# Patient Record
Sex: Female | Born: 1990 | Hispanic: No | Marital: Single | Smoking: Former smoker
Health system: Southern US, Community
[De-identification: ages and names within clinical notes are randomized; demographics above are authoritative.]

## PROBLEM LIST (undated history)

## (undated) DIAGNOSIS — E282 Polycystic ovarian syndrome: Secondary | ICD-10-CM

## (undated) DIAGNOSIS — T7840XA Allergy, unspecified, initial encounter: Secondary | ICD-10-CM

## (undated) DIAGNOSIS — R7303 Prediabetes: Secondary | ICD-10-CM

## (undated) DIAGNOSIS — Z87891 Personal history of nicotine dependence: Secondary | ICD-10-CM

## (undated) DIAGNOSIS — K219 Gastro-esophageal reflux disease without esophagitis: Secondary | ICD-10-CM

## (undated) DIAGNOSIS — D72829 Elevated white blood cell count, unspecified: Secondary | ICD-10-CM

## (undated) DIAGNOSIS — D649 Anemia, unspecified: Secondary | ICD-10-CM

## (undated) DIAGNOSIS — E669 Obesity, unspecified: Secondary | ICD-10-CM

## (undated) DIAGNOSIS — G8929 Other chronic pain: Secondary | ICD-10-CM

## (undated) HISTORY — DX: Personal history of nicotine dependence: Z87.891

## (undated) HISTORY — DX: Other chronic pain: G89.29

## (undated) HISTORY — PX: NO PAST SURGERIES: SHX2092

## (undated) HISTORY — PX: TONSILLECTOMY: SUR1361

## (undated) HISTORY — DX: Obesity, unspecified: E66.9

## (undated) HISTORY — DX: Allergy, unspecified, initial encounter: T78.40XA

---
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
## 2011-02-17 ENCOUNTER — Emergency Department (HOSPITAL_COMMUNITY)
Admission: EM | Admit: 2011-02-17 | Discharge: 2011-02-17 | Disposition: A | Discharge status: Home / Self Care | Payer: Self-pay | Attending: Emergency Medicine | Admitting: Emergency Medicine

## 2011-02-17 DIAGNOSIS — L02419 Cutaneous abscess of limb, unspecified: Secondary | ICD-10-CM | POA: Insufficient documentation

## 2011-02-17 DIAGNOSIS — Z880 Allergy status to penicillin: Secondary | ICD-10-CM | POA: Insufficient documentation

## 2011-03-25 ENCOUNTER — Emergency Department (HOSPITAL_COMMUNITY)
Admission: EM | Admit: 2011-03-25 | Discharge: 2011-03-25 | Discharge status: Against Medical Advice | Payer: Self-pay | Attending: Emergency Medicine | Admitting: Emergency Medicine

## 2011-03-25 DIAGNOSIS — R52 Pain, unspecified: Secondary | ICD-10-CM | POA: Insufficient documentation

## 2011-03-25 DIAGNOSIS — R070 Pain in throat: Secondary | ICD-10-CM | POA: Insufficient documentation

## 2011-03-26 ENCOUNTER — Emergency Department (HOSPITAL_COMMUNITY)
Admission: EM | Admit: 2011-03-26 | Discharge: 2011-03-26 | Disposition: A | Discharge status: Home / Self Care | Payer: Self-pay | Attending: Emergency Medicine | Admitting: Emergency Medicine

## 2011-03-26 DIAGNOSIS — J069 Acute upper respiratory infection, unspecified: Secondary | ICD-10-CM | POA: Insufficient documentation

## 2011-07-17 ENCOUNTER — Encounter (HOSPITAL_COMMUNITY): Payer: Self-pay | Admitting: *Deleted

## 2011-07-17 ENCOUNTER — Emergency Department (HOSPITAL_COMMUNITY)
Admission: EM | Admit: 2011-07-17 | Discharge: 2011-07-18 | Disposition: A | Discharge status: Home / Self Care | Payer: Self-pay | Attending: Emergency Medicine | Admitting: Emergency Medicine

## 2011-07-17 DIAGNOSIS — J45909 Unspecified asthma, uncomplicated: Secondary | ICD-10-CM | POA: Insufficient documentation

## 2011-07-17 DIAGNOSIS — R112 Nausea with vomiting, unspecified: Secondary | ICD-10-CM | POA: Insufficient documentation

## 2011-07-17 DIAGNOSIS — R51 Headache: Secondary | ICD-10-CM | POA: Insufficient documentation

## 2011-07-17 DIAGNOSIS — R509 Fever, unspecified: Secondary | ICD-10-CM | POA: Insufficient documentation

## 2011-07-17 DIAGNOSIS — R109 Unspecified abdominal pain: Secondary | ICD-10-CM | POA: Insufficient documentation

## 2011-07-17 DIAGNOSIS — R197 Diarrhea, unspecified: Secondary | ICD-10-CM | POA: Insufficient documentation

## 2011-07-17 LAB — CBC
MCH: 27.3 pg (ref 26.0–34.0)
MCHC: 33.9 g/dL (ref 30.0–36.0)
Platelets: 281 10*3/uL (ref 150–400)
RDW: 14.1 % (ref 11.5–15.5)

## 2011-07-17 LAB — URINALYSIS, ROUTINE W REFLEX MICROSCOPIC
Glucose, UA: NEGATIVE mg/dL
Leukocytes, UA: NEGATIVE
pH: 7.5 (ref 5.0–8.0)

## 2011-07-17 LAB — DIFFERENTIAL
Basophils Absolute: 0 10*3/uL (ref 0.0–0.1)
Basophils Relative: 0 % (ref 0–1)
Eosinophils Absolute: 0 10*3/uL (ref 0.0–0.7)
Neutro Abs: 5.7 10*3/uL (ref 1.7–7.7)
Neutrophils Relative %: 81 % — ABNORMAL HIGH (ref 43–77)

## 2011-07-17 LAB — COMPREHENSIVE METABOLIC PANEL
ALT: 22 U/L (ref 0–35)
AST: 17 U/L (ref 0–37)
Albumin: 3.5 g/dL (ref 3.5–5.2)
Alkaline Phosphatase: 80 U/L (ref 39–117)
BUN: 10 mg/dL (ref 6–23)
Potassium: 3.1 mEq/L — ABNORMAL LOW (ref 3.5–5.1)
Sodium: 131 mEq/L — ABNORMAL LOW (ref 135–145)
Total Protein: 7.4 g/dL (ref 6.0–8.3)

## 2011-07-17 LAB — URINE MICROSCOPIC-ADD ON

## 2011-07-17 LAB — LIPASE, BLOOD: Lipase: 9 U/L — ABNORMAL LOW (ref 11–59)

## 2011-07-17 LAB — POCT PREGNANCY, URINE: Preg Test, Ur: NEGATIVE

## 2011-07-17 MED ORDER — SODIUM CHLORIDE 0.9 % IV BOLUS (SEPSIS)
1000.0000 mL | Freq: Once | INTRAVENOUS | Status: AC
  Administered 2011-07-17: 1000 mL via INTRAVENOUS

## 2011-07-17 MED ORDER — ONDANSETRON HCL 4 MG/2ML IJ SOLN
4.0000 mg | Freq: Once | INTRAMUSCULAR | Status: AC
  Administered 2011-07-17: 4 mg via INTRAVENOUS
  Filled 2011-07-17: qty 2

## 2011-07-17 MED ORDER — HYDROMORPHONE HCL PF 1 MG/ML IJ SOLN
0.5000 mg | Freq: Once | INTRAMUSCULAR | Status: AC
  Administered 2011-07-17: 0.5 mg via INTRAVENOUS
  Filled 2011-07-17: qty 1

## 2011-07-17 MED ORDER — ACETAMINOPHEN 325 MG PO TABS
650.0000 mg | ORAL_TABLET | Freq: Four times a day (QID) | ORAL | Status: DC | PRN
  Administered 2011-07-17: 650 mg via ORAL
  Filled 2011-07-17: qty 2

## 2011-07-17 NOTE — ED Provider Notes (Signed)
History     CSN: [id number]  Arrival date & time 07/17/11  1736   First MD Initiated Contact with Patient 07/17/11 1924      Chief Complaint  Patient presents with  . Abdominal Pain  . Fever    HPI The patient presents with abdominal pain, fever, nausea, vomiting, diarrhea and headache.  She notes that her symptoms began yesterday, approximately 24 ago.  Since onset her symptoms have been worsening.  She denies attempts at control with any medications.  The pain is diffuse, crampy.  The patient denies any vaginal pain or discharge.  She does note mild vaginal spotting GERD the patient's last menstrual period was one month ago she denies any confusion, disorientation, dyspnea, chest pain.  Past Medical History  Diagnosis Date  . Asthma     Past Surgical History  Procedure Date  . Tonsillectomy     No family history on file.  History  Substance Use Topics  . Smoking status: Current Everyday Smoker  . Smokeless tobacco: Not on file  . Alcohol Use: Yes     once a week    OB History    Grav Para Term Preterm Abortions TAB SAB Ect Mult Living                  Review of Systems  Constitutional:       HPI  HENT:       HPI otherwise negative  Eyes: Negative.   Respiratory:       HPI, otherwise negative  Cardiovascular:       HPI, otherwise nmegative  Gastrointestinal: Positive for nausea, vomiting and diarrhea.  Genitourinary:       HPI, otherwise negative  Musculoskeletal:       HPI, otherwise negative  Skin: Negative for rash.  Neurological: Negative for syncope.    Allergies  Penicillins and Robitussin  Home Medications   Current Outpatient Rx  Name Route Sig Dispense Refill  . OXYCODONE HCL PO Oral Take 1 tablet by mouth once.      BP 121/100  Pulse 107  Temp(Src) 100.3 F (37.9 C) (Oral)  Resp 20  SpO2 100%  LMP 06/24/2011  Physical Exam  Nursing note and vitals reviewed. Constitutional: She is oriented to person, place, and time. She  appears well-developed and well-nourished. No distress.  HENT:  Head: Normocephalic and atraumatic.  Eyes: Conjunctivae and EOM are normal.  Cardiovascular: Normal rate and regular rhythm.   Pulmonary/Chest: Effort normal and breath sounds normal. No stridor. No respiratory distress.  Abdominal: She exhibits no distension.  Musculoskeletal: She exhibits no edema.  Neurological: She is alert and oriented to person, place, and time. No cranial nerve deficit.  Skin: Skin is warm and dry.  Psychiatric: She has a normal mood and affect.    ED Course  Procedures (including critical care time)  Labs Reviewed  URINALYSIS, ROUTINE W REFLEX MICROSCOPIC - Abnormal; Notable for the following:    Hgb urine dipstick LARGE (*)    All other components within normal limits  DIFFERENTIAL - Abnormal; Notable for the following:    Neutrophils Relative 81 (*)    Lymphocytes Relative 11 (*)    All other components within normal limits  COMPREHENSIVE METABOLIC PANEL - Abnormal; Notable for the following:    Sodium 131 (*)    Potassium 3.1 (*)    Glucose, Bld 104 (*)    All other components within normal limits  LIPASE, BLOOD - Abnormal; Notable for the  following:    Lipase 9 (*)    All other components within normal limits  POCT PREGNANCY, URINE  URINE MICROSCOPIC-ADD ON  CBC   No results found.   No diagnosis found.   Pulse oximetry 100% room air normal  MDM  This generally well 21 year old female presents with one day of headache, nausea, vomiting, diarrhea, fever.  On exam she is in no distress with a soft abdomen without guarding, focal tenderness.  The patient is not pregnant and her labs do not demonstrate leukocytosis nor acute findings.  She does have a fever, which improved with Tylenol.  Following fluids, Tylenol, analgesics, the patient noted substantial improvement in her condition.  Given this improvement, the absence of focal pain, pregnancy, persistent vomiting and diarrhea there  is low suspicion for ongoing acute pathology.  The patient's symptoms are likely viral in origin.  The patient is stable for discharge with continued evaluation by a primary care physician.  The patient requests referral to one of our affiliated clinics, this was accommodated.        Gerhard Munch, MD 07/17/11 2144

## 2011-07-17 NOTE — Discharge Instructions (Signed)
The definite cause of your headache, abdominal pain, vomiting was not identified through today's emergency department evaluation.  Your symptoms are most consistent with a viral gastroenteritis, or stomach "bug".  It is very important that you continue to have your condition evaluated and managed by a primary care physician.  Please use the provided referral information to ensure appropriate followup.  If you develop any new, or concerning changes in her condition, such as; pain focally in one area of your abdomen, confusion, persistent fever that this improved with Tylenol, persistent vomiting or diarrhea please return to the emergency department for a repeat evaluation.

## 2011-07-17 NOTE — ED Notes (Signed)
Pt reports severe lower abd pain that began last night. Sts pain radiates to both flanks. Denies UTI s/sx. Sts vaginal bleeding that began 3 days ago, spotting but with clots.

## 2011-07-17 NOTE — ED Notes (Addendum)
Fever at home as well. 101 today. Oxycodone taken at home, moms Rx, around 12.

## 2012-06-10 ENCOUNTER — Encounter (HOSPITAL_COMMUNITY): Payer: Self-pay | Admitting: Emergency Medicine

## 2012-06-10 ENCOUNTER — Emergency Department (HOSPITAL_COMMUNITY)
Admission: EM | Admit: 2012-06-10 | Discharge: 2012-06-10 | Disposition: A | Discharge status: Home / Self Care | Payer: Self-pay | Attending: Emergency Medicine | Admitting: Emergency Medicine

## 2012-06-10 DIAGNOSIS — Z3202 Encounter for pregnancy test, result negative: Secondary | ICD-10-CM | POA: Insufficient documentation

## 2012-06-10 DIAGNOSIS — R350 Frequency of micturition: Secondary | ICD-10-CM | POA: Insufficient documentation

## 2012-06-10 DIAGNOSIS — Z792 Long term (current) use of antibiotics: Secondary | ICD-10-CM | POA: Insufficient documentation

## 2012-06-10 DIAGNOSIS — F17200 Nicotine dependence, unspecified, uncomplicated: Secondary | ICD-10-CM | POA: Insufficient documentation

## 2012-06-10 DIAGNOSIS — Z79899 Other long term (current) drug therapy: Secondary | ICD-10-CM | POA: Insufficient documentation

## 2012-06-10 DIAGNOSIS — N898 Other specified noninflammatory disorders of vagina: Secondary | ICD-10-CM | POA: Insufficient documentation

## 2012-06-10 DIAGNOSIS — R110 Nausea: Secondary | ICD-10-CM | POA: Insufficient documentation

## 2012-06-10 DIAGNOSIS — A599 Trichomoniasis, unspecified: Secondary | ICD-10-CM | POA: Insufficient documentation

## 2012-06-10 DIAGNOSIS — J45909 Unspecified asthma, uncomplicated: Secondary | ICD-10-CM | POA: Insufficient documentation

## 2012-06-10 LAB — URINALYSIS, ROUTINE W REFLEX MICROSCOPIC
Bilirubin Urine: NEGATIVE
Glucose, UA: NEGATIVE mg/dL
Nitrite: NEGATIVE
Specific Gravity, Urine: 1.035 — ABNORMAL HIGH (ref 1.005–1.030)
pH: 6 (ref 5.0–8.0)

## 2012-06-10 LAB — WET PREP, GENITAL: Yeast Wet Prep HPF POC: NONE SEEN

## 2012-06-10 LAB — URINE MICROSCOPIC-ADD ON

## 2012-06-10 MED ORDER — METRONIDAZOLE 500 MG PO TABS: 500.0000 mg | ORAL_TABLET | Freq: Two times a day (BID) | ORAL | Status: DC

## 2012-06-10 MED ORDER — METRONIDAZOLE 500 MG PO TABS
2000.0000 mg | ORAL_TABLET | Freq: Once | ORAL | Status: AC
  Administered 2012-06-10: 2000 mg via ORAL
  Filled 2012-06-10: qty 4

## 2012-06-10 NOTE — ED Provider Notes (Signed)
Medical screening examination/treatment/procedure(s) were performed by non-physician practitioner and as supervising physician I was immediately available for consultation/collaboration.   Tinsley Lomas H Tinzlee Craker, MD 06/10/12 2354 

## 2012-06-10 NOTE — ED Notes (Signed)
Pt reports painful urination with burning x3 days. Urinary frequency and urgency. Pt denies vaginal bleeding, but "milky" vaginal discharge today. Denies abd pain. +nausea.

## 2012-06-10 NOTE — ED Provider Notes (Signed)
History     CSN: [id number]  Arrival date & time 06/10/12  1803   First MD Initiated Contact with Patient 06/10/12 1822      Chief Complaint  Patient presents with  . Dysuria    (Consider location/radiation/quality/duration/timing/severity/associated sxs/prior treatment) HPI Comments: This is a 22 year old female, who presents emergency department with chief complaint of dysuria. Patient states that her symptoms began 3 days ago. Additionally, she endorses urinary frequency and urgency. She has also noticed some vaginal discharge, but she is unsure of how long it has been present. She denies any abdominal pain, but says that she has been mildly nauseated. She has not tried anything to alleviate her symptoms. Nothing makes her symptoms better or worse.  The history is provided by the patient. No language interpreter was used.    Past Medical History  Diagnosis Date  . Asthma     Past Surgical History  Procedure Date  . Tonsillectomy     No family history on file.  History  Substance Use Topics  . Smoking status: Current Every Day Smoker -- 1.0 packs/day    Types: Cigarettes  . Smokeless tobacco: Not on file  . Alcohol Use: No     Comment: rarely    OB History    Grav Para Term Preterm Abortions TAB SAB Ect Mult Living                  Review of Systems  All other systems reviewed and are negative.    Allergies  Penicillins and Robitussin  Home Medications   Current Outpatient Rx  Name  Route  Sig  Dispense  Refill  . IBUPROFEN 200 MG PO TABS   Oral   Take 400 mg by mouth every 8 (eight) hours as needed. For pain.         Marland Kitchen AMOXICILLIN 500 MG PO CAPS   Oral   Take 500 mg by mouth 3 (three) times daily.           BP 125/61  Pulse 74  Temp 98.6 F (37 C) (Oral)  Resp 20  SpO2 100%  Physical Exam  Nursing note and vitals reviewed. Constitutional: She is oriented to person, place, and time. She appears well-developed and well-nourished.    HENT:  Head: Normocephalic and atraumatic.  Eyes: Conjunctivae normal and EOM are normal. Pupils are equal, round, and reactive to light.  Neck: Normal range of motion. Neck supple.  Cardiovascular: Normal rate and regular rhythm.  Exam reveals no gallop and no friction rub.   No murmur heard. Pulmonary/Chest: Effort normal and breath sounds normal. No respiratory distress. She has no wheezes. She has no rales. She exhibits no tenderness.  Abdominal: Soft. Bowel sounds are normal. She exhibits no distension and no mass. There is no tenderness. There is no rebound and no guarding.  Genitourinary: No labial fusion. There is no rash, tenderness, lesion or injury on the right labia. There is no rash, tenderness, lesion or injury on the left labia. Uterus is not deviated, not enlarged, not fixed and not tender. Cervix exhibits no motion tenderness, no discharge and no friability. Right adnexum displays no mass, no tenderness and no fullness. Left adnexum displays no mass, no tenderness and no fullness. No erythema, tenderness or bleeding around the vagina. No foreign body around the vagina. No signs of injury around the vagina. Vaginal discharge found.       White frothy discharge noted from vagina  Musculoskeletal: Normal range of  motion. She exhibits no edema and no tenderness.  Neurological: She is alert and oriented to person, place, and time.  Skin: Skin is warm and dry.  Psychiatric: She has a normal mood and affect. Her behavior is normal. Judgment and thought content normal.    ED Course  Procedures (including critical care time)   Labs Reviewed  POCT PREGNANCY, URINE  URINALYSIS, ROUTINE W REFLEX MICROSCOPIC   Results for orders placed during the hospital encounter of 06/10/12  URINALYSIS, ROUTINE W REFLEX MICROSCOPIC      Component Value Range   Color, Urine YELLOW  YELLOW   APPearance CLOUDY (*) CLEAR   Specific Gravity, Urine 1.035 (*) 1.005 - 1.030   pH 6.0  5.0 - 8.0    Glucose, UA NEGATIVE  NEGATIVE mg/dL   Hgb urine dipstick NEGATIVE  NEGATIVE   Bilirubin Urine NEGATIVE  NEGATIVE   Ketones, ur NEGATIVE  NEGATIVE mg/dL   Protein, ur 30 (*) NEGATIVE mg/dL   Urobilinogen, UA 1.0  0.0 - 1.0 mg/dL   Nitrite NEGATIVE  NEGATIVE   Leukocytes, UA MODERATE (*) NEGATIVE  POCT PREGNANCY, URINE      Component Value Range   Preg Test, Ur NEGATIVE  NEGATIVE  WET PREP, GENITAL      Component Value Range   Yeast Wet Prep HPF POC NONE SEEN  NONE SEEN   Trich, Wet Prep MODERATE (*) NONE SEEN   Clue Cells Wet Prep HPF POC NONE SEEN  NONE SEEN   WBC, Wet Prep HPF POC FEW (*) NONE SEEN  URINE MICROSCOPIC-ADD ON      Component Value Range   Squamous Epithelial / LPF RARE  RARE   WBC, UA 11-20  <3 WBC/hpf   Bacteria, UA RARE  RARE   Urine-Other TRICHOMONAS PRESENT       1. Trichimoniasis       MDM  22 year old female with dysuria and vaginal discharge. Will order UA, and perform pelvic exam.  8:13 PM Labs showed trichomoniasis, will treat the patient with metronidazole, and counseled the patient regarding transmission.  Patient understands and agrees with the plan. She is stable and ready for discharge.  8:22 PM  Patient states she can't afford the medicine, will treat with 2g metronidazole now.       Roxy Horseman, PA-C 06/10/12 2013  Roxy Horseman, PA-C 06/10/12 2022

## 2012-06-10 NOTE — ED Notes (Signed)
Pt presenting to ed with c/o dysuria pt states urinary frequency and urgency. Pt denies burning with urination at this time

## 2012-06-12 LAB — GC/CHLAMYDIA PROBE AMP: CT Probe RNA: POSITIVE — AB

## 2012-06-13 NOTE — ED Notes (Signed)
+  Chlamydia. Chart sent to EDP office for review. [facility] attached. 

## 2012-06-17 NOTE — ED Notes (Signed)
Chart returned from EDP office  rx for Zithromax 2 gram po needs to be called to pharmacy.

## 2012-06-19 NOTE — ED Notes (Signed)
Patient notified, prescription called to [facility] by [facility].

## 2012-07-28 ENCOUNTER — Encounter (HOSPITAL_COMMUNITY): Payer: Self-pay | Admitting: *Deleted

## 2012-07-28 ENCOUNTER — Emergency Department (HOSPITAL_COMMUNITY)
Admission: EM | Admit: 2012-07-28 | Discharge: 2012-07-28 | Disposition: A | Discharge status: Home / Self Care | Payer: Self-pay | Attending: Emergency Medicine | Admitting: Emergency Medicine

## 2012-07-28 DIAGNOSIS — L509 Urticaria, unspecified: Secondary | ICD-10-CM | POA: Insufficient documentation

## 2012-07-28 DIAGNOSIS — L272 Dermatitis due to ingested food: Secondary | ICD-10-CM | POA: Insufficient documentation

## 2012-07-28 DIAGNOSIS — H5789 Other specified disorders of eye and adnexa: Secondary | ICD-10-CM | POA: Insufficient documentation

## 2012-07-28 DIAGNOSIS — F17200 Nicotine dependence, unspecified, uncomplicated: Secondary | ICD-10-CM | POA: Insufficient documentation

## 2012-07-28 DIAGNOSIS — R21 Rash and other nonspecific skin eruption: Secondary | ICD-10-CM | POA: Insufficient documentation

## 2012-07-28 DIAGNOSIS — L259 Unspecified contact dermatitis, unspecified cause: Secondary | ICD-10-CM | POA: Insufficient documentation

## 2012-07-28 DIAGNOSIS — J45909 Unspecified asthma, uncomplicated: Secondary | ICD-10-CM | POA: Insufficient documentation

## 2012-07-28 MED ORDER — DEXAMETHASONE SODIUM PHOSPHATE 10 MG/ML IJ SOLN
10.0000 mg | Freq: Once | INTRAMUSCULAR | Status: AC
  Administered 2012-07-28: 10 mg via INTRAMUSCULAR
  Filled 2012-07-28: qty 1

## 2012-07-28 MED ORDER — TRIAMCINOLONE ACETONIDE 0.1 % EX CREA: TOPICAL_CREAM | Freq: Three times a day (TID) | CUTANEOUS | Status: DC

## 2012-07-28 MED ORDER — DIPHENHYDRAMINE HCL 25 MG PO CAPS
50.0000 mg | ORAL_CAPSULE | Freq: Once | ORAL | Status: AC
  Administered 2012-07-28: 50 mg via ORAL
  Filled 2012-07-28: qty 2

## 2012-07-28 NOTE — ED Provider Notes (Signed)
History     CSN: [id number]  Arrival date & time 07/28/12  1445   First MD Initiated Contact with Patient 07/28/12 [phone]      Chief Complaint  Patient presents with  . Allergic Reaction    (Consider location/radiation/quality/duration/timing/severity/associated sxs/prior treatment) HPI Comments: 22 year old female presents to the ED complaining of swollen left eye and pruritus after cooking shrimp after ingesting two of them. She noticed the eye swelling and a "funny feeling" in her throat shortly after ingestion. In route to the ED pt noticed hives erupting on upper back, inner arms, and legs. Swelling around eye has decreased, pruritus has worsened. Pt feels throat is fine at present and breathing is comfortable. Eye had clear watery discharge after swelling began. Pt denies SOB, oral edema, vision changes, chest pain. No prior reaction. Denies taking any medication for sxs. PCN allergy.   Patient is a 22 y.o. female presenting with allergic reaction. The history is provided by the patient.  Allergic Reaction The primary symptoms are  rash. The primary symptoms do not include wheezing, shortness of breath, cough or nausea.  Significant symptoms that are not present include eye redness.    Past Medical History  Diagnosis Date  . Asthma     Past Surgical History  Procedure Laterality Date  . Tonsillectomy      No family history on file.  History  Substance Use Topics  . Smoking status: Current Every Day Smoker -- 1.00 packs/day    Types: Cigarettes  . Smokeless tobacco: Not on file  . Alcohol Use: No     Comment: rarely    OB History   Grav Para Term Preterm Abortions TAB SAB Ect Mult Living                  Review of Systems  Constitutional: Negative for diaphoresis.  HENT: Positive for facial swelling (L eyelid). Negative for sore throat, drooling, trouble swallowing, neck pain, neck stiffness and voice change.   Eyes: Negative for photophobia, pain, discharge,  redness, itching and visual disturbance.  Respiratory: Negative for apnea, cough, choking, chest tightness, shortness of breath, wheezing and stridor.   Cardiovascular: Negative for chest pain.  Gastrointestinal: Negative for nausea.  Skin: Positive for rash. Negative for color change, pallor and wound.  Allergic/Immunologic: Positive for food allergies.  All other systems reviewed and are negative.    Allergies  Penicillins and Robitussin  Home Medications   Current Outpatient Rx  Name  Route  Sig  Dispense  Refill  . Aspirin-Salicylamide-Caffeine (BC HEADACHE POWDER PO)   Oral   Take 1 packet by mouth daily as needed. For headache           BP 123/78  Pulse 85  Temp(Src) 98.2 F (36.8 C) (Oral)  Resp 20  SpO2 99%  LMP 07/25/2012  Physical Exam  Nursing note and vitals reviewed. Constitutional: She is oriented to person, place, and time. She appears well-developed and well-nourished.  HENT:  Head: Normocephalic and atraumatic.  Mouth/Throat: Uvula is midline, oropharynx is clear and moist and mucous membranes are normal. No oral lesions. No edematous. No posterior oropharyngeal edema or posterior oropharyngeal erythema.  Eyes: Conjunctivae and EOM are normal. Pupils are equal, round, and reactive to light. Left eye exhibits no discharge.  Mild left upper eyelid swelling. Non tender. No discharge.   Neck: Normal range of motion. Neck supple. No tracheal deviation present.  Cardiovascular: Normal rate, regular rhythm and normal heart sounds.  Pulmonary/Chest: Effort normal and breath sounds normal. No respiratory distress. She has no wheezes. She has no rhonchi. She has no rales.  Musculoskeletal: Normal range of motion. She exhibits no edema.  Lymphadenopathy:    She has no cervical adenopathy.  Neurological: She is alert and oriented to person, place, and time.  Skin: Skin is warm and dry. Rash (few urticarial lesions on upper back, neck, inner arms, and legs  bilaterally  ) noted. No erythema. No pallor.  Psychiatric: She has a normal mood and affect. Her behavior is normal.    ED Course  Procedures (including critical care time)  Labs Reviewed - No data to display No results found.   1. Dermatitis due to allergic reaction to food   2. Hives       MDM  Possible allergic reaction to shrimp. Advised her to stay away from shrimp. Shot of IM Decadron given in the emergency department. I prescribed her triamcinolone cream. Advised her to take Benadryl for itching. Return precautions discussed. No respiratory compromise. She is in no apparent distress. No visual disturbance. Patient states understanding of plan and is agreeable.        Trevor Mace, PA-C 07/28/12 1606

## 2012-07-28 NOTE — ED Notes (Signed)
PT states she recently ate shrimp and had immediate reaction with left eye swelling.  Pt is itching all over with hives.  Airway intact

## 2012-07-29 NOTE — ED Provider Notes (Signed)
Medical screening examination/treatment/procedure(s) were performed by non-physician practitioner and as supervising physician I was immediately available for consultation/collaboration.   Lyanne Co, MD 07/29/12 [phone]

## 2012-08-11 ENCOUNTER — Emergency Department (HOSPITAL_COMMUNITY): Payer: No Typology Code available for payment source

## 2012-08-11 ENCOUNTER — Encounter (HOSPITAL_COMMUNITY): Payer: Self-pay | Admitting: *Deleted

## 2012-08-11 ENCOUNTER — Emergency Department (HOSPITAL_COMMUNITY)
Admission: EM | Admit: 2012-08-11 | Discharge: 2012-08-11 | Disposition: A | Discharge status: Home / Self Care | Payer: No Typology Code available for payment source | Attending: Emergency Medicine | Admitting: Emergency Medicine

## 2012-08-11 DIAGNOSIS — J45909 Unspecified asthma, uncomplicated: Secondary | ICD-10-CM | POA: Insufficient documentation

## 2012-08-11 DIAGNOSIS — Y92410 Unspecified street and highway as the place of occurrence of the external cause: Secondary | ICD-10-CM | POA: Insufficient documentation

## 2012-08-11 DIAGNOSIS — IMO0002 Reserved for concepts with insufficient information to code with codable children: Secondary | ICD-10-CM | POA: Insufficient documentation

## 2012-08-11 DIAGNOSIS — F17200 Nicotine dependence, unspecified, uncomplicated: Secondary | ICD-10-CM | POA: Insufficient documentation

## 2012-08-11 DIAGNOSIS — Y9389 Activity, other specified: Secondary | ICD-10-CM | POA: Insufficient documentation

## 2012-08-11 DIAGNOSIS — S39012A Strain of muscle, fascia and tendon of lower back, initial encounter: Secondary | ICD-10-CM

## 2012-08-11 DIAGNOSIS — Z8742 Personal history of other diseases of the female genital tract: Secondary | ICD-10-CM | POA: Insufficient documentation

## 2012-08-11 DIAGNOSIS — E669 Obesity, unspecified: Secondary | ICD-10-CM | POA: Insufficient documentation

## 2012-08-11 HISTORY — DX: Polycystic ovarian syndrome: E28.2

## 2012-08-11 MED ORDER — IBUPROFEN 800 MG PO TABS
800.0000 mg | ORAL_TABLET | Freq: Once | ORAL | Status: AC
  Administered 2012-08-11: 800 mg via ORAL
  Filled 2012-08-11: qty 1

## 2012-08-11 MED ORDER — HYDROCODONE-ACETAMINOPHEN 5-325 MG PO TABS
1.0000 | ORAL_TABLET | Freq: Once | ORAL | Status: AC
  Administered 2012-08-11: 1 via ORAL
  Filled 2012-08-11: qty 1

## 2012-08-11 MED ORDER — IBUPROFEN 800 MG PO TABS: 800.0000 mg | ORAL_TABLET | Freq: Three times a day (TID) | ORAL | Status: DC | PRN
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
## 2012-08-11 NOTE — ED Notes (Signed)
Patient transported to X-ray 

## 2012-08-12 NOTE — ED Provider Notes (Signed)
Medical screening examination/treatment/procedure(s) were performed by non-physician practitioner and as supervising physician I was immediately available for consultation/collaboration.  John-Adam Lovada Barwick, M.D.     John-Adam Margherita Collyer, MD 08/12/12 0346 

## 2012-08-22 ENCOUNTER — Encounter (HOSPITAL_COMMUNITY): Payer: Self-pay | Admitting: Emergency Medicine

## 2012-08-22 ENCOUNTER — Emergency Department (HOSPITAL_COMMUNITY)
Admission: EM | Admit: 2012-08-22 | Discharge: 2012-08-22 | Disposition: A | Discharge status: Home / Self Care | Payer: Self-pay | Attending: Emergency Medicine | Admitting: Emergency Medicine

## 2012-08-22 DIAGNOSIS — J029 Acute pharyngitis, unspecified: Secondary | ICD-10-CM | POA: Insufficient documentation

## 2012-08-22 DIAGNOSIS — J3489 Other specified disorders of nose and nasal sinuses: Secondary | ICD-10-CM | POA: Insufficient documentation

## 2012-08-22 DIAGNOSIS — Z8742 Personal history of other diseases of the female genital tract: Secondary | ICD-10-CM | POA: Insufficient documentation

## 2012-08-22 DIAGNOSIS — J069 Acute upper respiratory infection, unspecified: Secondary | ICD-10-CM | POA: Insufficient documentation

## 2012-08-22 DIAGNOSIS — F17200 Nicotine dependence, unspecified, uncomplicated: Secondary | ICD-10-CM | POA: Insufficient documentation

## 2012-08-22 DIAGNOSIS — R05 Cough: Secondary | ICD-10-CM | POA: Insufficient documentation

## 2012-08-22 DIAGNOSIS — J45909 Unspecified asthma, uncomplicated: Secondary | ICD-10-CM | POA: Insufficient documentation

## 2012-08-22 DIAGNOSIS — R059 Cough, unspecified: Secondary | ICD-10-CM | POA: Insufficient documentation

## 2012-08-22 DIAGNOSIS — R0982 Postnasal drip: Secondary | ICD-10-CM | POA: Insufficient documentation

## 2012-08-22 MED ORDER — DIPHENHYDRAMINE HCL 12.5 MG/5ML PO SYRP: 25.0000 mg | ORAL_SOLUTION | Freq: Four times a day (QID) | ORAL | Status: DC | PRN

## 2012-08-22 MED ORDER — CETIRIZINE-PSEUDOEPHEDRINE ER 5-120 MG PO TB12: 1.0000 | ORAL_TABLET | Freq: Two times a day (BID) | ORAL | Status: DC

## 2012-08-22 NOTE — ED Provider Notes (Signed)
History     CSN: [id number]  Arrival date & time 08/22/12  0409   First MD Initiated Contact with Patient 08/22/12 [phone]      Chief Complaint  Patient presents with  . Sore Throat  . Cough   HPI  History provided by the patient. Patient is a 22 year old female has history of asthma who presents with complaints of worsening sore throat, congestion and post nasal drip. Symptoms first began about 2-3 days ago but became much worse this morning. Patient has used some Alka-Seltzer cold medicine without any improvement of symptoms. Denies any known sick contacts. Has not had any recent travel. She denies any fever but reports some subjective chills and sweats. She has occasional nonproductive cough. Denies any shortness of breath or chest pain. No nausea vomiting or diarrhea symptoms. No other aggravating or alleviating factors. No other associated symptoms.    Past Medical History  Diagnosis Date  . Asthma   . Polycystic ovarian disease     Past Surgical History  Procedure Laterality Date  . Tonsillectomy      No family history on file.  History  Substance Use Topics  . Smoking status: Current Every Day Smoker -- 1.00 packs/day    Types: Cigarettes  . Smokeless tobacco: Not on file  . Alcohol Use: Yes     Comment: rarely    OB History   Grav Para Term Preterm Abortions TAB SAB Ect Mult Living                  Review of Systems  Constitutional: Positive for chills and diaphoresis. Negative for fever and appetite change.  HENT: Positive for congestion, sore throat and postnasal drip. Negative for ear pain.   Respiratory: Positive for cough. Negative for shortness of breath.   Gastrointestinal: Negative for vomiting, abdominal pain and diarrhea.  All other systems reviewed and are negative.    Allergies  Penicillins; Robitussin; and Shrimp  Home Medications   Current Outpatient Rx  Name  Route  Sig  Dispense  Refill  . ibuprofen (ADVIL,MOTRIN) 800 MG tablet  Oral   Take 1 tablet (800 mg total) by mouth every 8 (eight) hours as needed for pain.   30 tablet   0   . Aspirin-Salicylamide-Caffeine (BC HEADACHE POWDER PO)   Oral   Take 1 packet by mouth daily as needed (Headache). For headache         . cetirizine-pseudoephedrine (ZYRTEC-D) 5-120 MG per tablet   Oral   Take 1 tablet by mouth 2 (two) times daily.   30 tablet   0   . diphenhydrAMINE (BENYLIN) 12.5 MG/5ML syrup   Oral   Take 10 mLs (25 mg total) by mouth 4 (four) times daily as needed (congestion and sore throat).   120 mL   0     BP 127/68  Pulse 105  Temp(Src) 98.9 F (37.2 C) (Oral)  Resp 18  Wt 276 lb 4 oz (125.306 kg)  BMI 45.97 kg/m2  SpO2 99%  LMP 08/02/2012  Physical Exam  Nursing note and vitals reviewed. Constitutional: She is oriented to person, place, and time. She appears well-developed and well-nourished. No distress.  HENT:  Head: Normocephalic.  Right Ear: Tympanic membrane normal.  Left Ear: Tympanic membrane normal.  Mild erythema of pharynx with cobblestoning. No exudate. Uvula midline.  Neck: Normal range of motion. Neck supple.  Cardiovascular: Normal rate and regular rhythm.   No murmur heard. Pulmonary/Chest: Effort normal and breath sounds  normal. No respiratory distress. She has no wheezes. She has no rales.  Abdominal: Soft. There is no tenderness.  Musculoskeletal: Normal range of motion.  Lymphadenopathy:    She has no cervical adenopathy.  Neurological: She is alert and oriented to person, place, and time.  Skin: Skin is warm and dry. No rash noted.  Psychiatric: She has a normal mood and affect. Her behavior is normal.    ED Course  Procedures      1. URI (upper respiratory infection)   2. Pharyngitis       MDM  4:30 AM patient seen and evaluated. Patient well-appearing in no acute distress. She is not appears fairly ill or toxic.        Angus Seller, PA-C 08/22/12 [phone]

## 2012-08-22 NOTE — ED Notes (Signed)
Pt c/o non productive cough and sore throat x 3 days.

## 2012-08-22 NOTE — ED Provider Notes (Signed)
Medical screening examination/treatment/procedure(s) were performed by non-physician practitioner and as supervising physician I was immediately available for consultation/collaboration.  Jones Skene, M.D.   Jones Skene, MD 08/22/12 [phone]

## 2012-09-19 ENCOUNTER — Emergency Department (HOSPITAL_COMMUNITY)
Admission: EM | Admit: 2012-09-19 | Discharge: 2012-09-19 | Disposition: A | Discharge status: Home / Self Care | Payer: Self-pay | Attending: Emergency Medicine | Admitting: Emergency Medicine

## 2012-09-19 ENCOUNTER — Encounter (HOSPITAL_COMMUNITY): Payer: Self-pay | Admitting: *Deleted

## 2012-09-19 DIAGNOSIS — F17200 Nicotine dependence, unspecified, uncomplicated: Secondary | ICD-10-CM | POA: Insufficient documentation

## 2012-09-19 DIAGNOSIS — Z8742 Personal history of other diseases of the female genital tract: Secondary | ICD-10-CM | POA: Insufficient documentation

## 2012-09-19 DIAGNOSIS — R1084 Generalized abdominal pain: Secondary | ICD-10-CM | POA: Insufficient documentation

## 2012-09-19 DIAGNOSIS — J45909 Unspecified asthma, uncomplicated: Secondary | ICD-10-CM | POA: Insufficient documentation

## 2012-09-19 DIAGNOSIS — R1110 Vomiting, unspecified: Secondary | ICD-10-CM | POA: Insufficient documentation

## 2012-09-19 LAB — COMPREHENSIVE METABOLIC PANEL
ALT: 19 U/L (ref 0–35)
AST: 16 U/L (ref 0–37)
CO2: 27 mEq/L (ref 19–32)
Calcium: 9.7 mg/dL (ref 8.4–10.5)
Chloride: 104 mEq/L (ref 96–112)
GFR calc non Af Amer: >90 mL/min (ref >90)
Potassium: 3.3 mEq/L — ABNORMAL LOW (ref 3.5–5.1)
Sodium: 139 mEq/L (ref 135–145)

## 2012-09-19 LAB — URINALYSIS, ROUTINE W REFLEX MICROSCOPIC
Leukocytes, UA: NEGATIVE
Nitrite: NEGATIVE
Specific Gravity, Urine: 1.027 (ref 1.005–1.030)
Urobilinogen, UA: 0.2 mg/dL (ref 0.0–1.0)

## 2012-09-19 LAB — CBC
MCH: 27.1 pg (ref 26.0–34.0)
Platelets: 370 10*3/uL (ref 150–400)
RBC: 4.61 MIL/uL (ref 3.87–5.11)
WBC: 9.1 10*3/uL (ref 4.0–10.5)

## 2012-09-19 MED ORDER — ONDANSETRON 4 MG PO TBDP
4.0000 mg | ORAL_TABLET | Freq: Once | ORAL | Status: AC
  Administered 2012-09-19: 4 mg via ORAL
  Filled 2012-09-19: qty 1

## 2012-09-19 MED ORDER — ONDANSETRON 4 MG PO TBDP: 4.0000 mg | ORAL_TABLET | Freq: Once | ORAL | Status: DC

## 2012-09-19 MED ORDER — HYDROCODONE-ACETAMINOPHEN 5-325 MG PO TABS: 1.0000 | ORAL_TABLET | Freq: Four times a day (QID) | ORAL | Status: DC | PRN

## 2012-09-19 NOTE — ED Provider Notes (Signed)
History     CSN: [id number]  Arrival date & time 09/19/12  1701   First MD Initiated Contact with Patient 09/19/12 1705      Chief Complaint  Patient presents with  . Emesis    (Consider location/radiation/quality/duration/timing/severity/associated sxs/prior treatment) Patient is a 22 y.o. female presenting with vomiting. The history is provided by the patient.  Emesis Severity:  Mild Duration:  1 day Timing:  Sporadic Number of daily episodes:  2 Quality:  Stomach contents Able to tolerate:  Liquids and solids Progression:  Unchanged Chronicity:  New Recent urination:  Normal Context: not post-tussive and not self-induced   Relieved by:  None tried Worsened by:  Nothing tried Ineffective treatments:  None tried Associated symptoms: abdominal pain (diffuse abdominal cramping )   Associated symptoms: no arthralgias, no chills, no cough, no diarrhea, no fever, no headaches, no myalgias, no sore throat and no URI   Risk factors: no alcohol use, no diabetes, not pregnant now, no prior abdominal surgery, no sick contacts, no suspect food intake and no travel to endemic areas     Past Medical History  Diagnosis Date  . Asthma   . Polycystic ovarian disease     Past Surgical History  Procedure Laterality Date  . Tonsillectomy      No family history on file.  History  Substance Use Topics  . Smoking status: Current Every Day Smoker -- 1.00 packs/day    Types: Cigarettes  . Smokeless tobacco: Not on file  . Alcohol Use: Yes     Comment: rarely    OB History   Grav Para Term Preterm Abortions TAB SAB Ect Mult Living                  Review of Systems  Constitutional: Negative for chills.  HENT: Negative for sore throat.   Gastrointestinal: Positive for vomiting and abdominal pain (diffuse abdominal cramping ). Negative for diarrhea.  Musculoskeletal: Negative for myalgias and arthralgias.  Neurological: Negative for headaches.  All other systems reviewed  and are negative.    Allergies  Penicillins; Robitussin; and Shrimp  Home Medications   Current Outpatient Rx  Name  Route  Sig  Dispense  Refill  . Aspirin-Salicylamide-Caffeine (BC HEADACHE POWDER PO)   Oral   Take 1 packet by mouth daily as needed (Headache). For headache         . cetirizine-pseudoephedrine (ZYRTEC-D) 5-120 MG per tablet   Oral   Take 1 tablet by mouth 2 (two) times daily.   30 tablet   0   . diphenhydrAMINE (BENYLIN) 12.5 MG/5ML syrup   Oral   Take 10 mLs (25 mg total) by mouth 4 (four) times daily as needed (congestion and sore throat).   120 mL   0   . ibuprofen (ADVIL,MOTRIN) 800 MG tablet   Oral   Take 1 tablet (800 mg total) by mouth every 8 (eight) hours as needed for pain.   30 tablet   0     BP 124/74  Pulse 74  Temp(Src) 98.9 F (37.2 C) (Oral)  Resp 20  SpO2 100%  LMP 09/12/2012  Physical Exam  Nursing note and vitals reviewed. Constitutional: Vital signs are normal. She appears well-developed and well-nourished. No distress.  HENT:  Head: Normocephalic and atraumatic.  Mouth/Throat: Uvula is midline, oropharynx is clear and moist and mucous membranes are normal.  Eyes: Conjunctivae and EOM are normal. Pupils are equal, round, and reactive to light.  Neck: Normal range  of motion and full passive range of motion without pain. Neck supple. No spinous process tenderness and no muscular tenderness present. No rigidity. No Brudzinski's sign noted.  Cardiovascular: Normal rate and regular rhythm.   Pulmonary/Chest: Effort normal and breath sounds normal. No accessory muscle usage. Not tachypneic. No respiratory distress.  Abdominal: Soft. Normal appearance. She exhibits no distension, no ascites, no pulsatile midline mass and no mass. There is tenderness. There is no CVA tenderness. No hernia.    Obese  Lymphadenopathy:    She has no cervical adenopathy.  Neurological: She is alert.  Skin: Skin is warm and dry. No rash noted. She  is not diaphoretic.  Psychiatric: She has a normal mood and affect. Her speech is normal and behavior is normal.    ED Course  Procedures (including critical care time)  Labs Reviewed  URINALYSIS, ROUTINE W REFLEX MICROSCOPIC - Abnormal; Notable for the following:    APPearance CLOUDY (*)    All other components within normal limits  COMPREHENSIVE METABOLIC PANEL - Abnormal; Notable for the following:    Potassium 3.3 (*)    Albumin 3.3 (*)    Total Bilirubin 0.1 (*)    All other components within normal limits  CBC  LIPASE, BLOOD  PREGNANCY, URINE   No results found.   No diagnosis found.  Pt states that she has no abdominal pain and does not want the [place] if her labs are normal, which they are. Abdomen re-examined and very low suspicion for surgical abdomen as tenderness has improved & there are no peritoneal signs. Emergent [place] not indicated.   MDM  22 year old obese female with history of polycystic ovarian disease presents emergency department with epigastric and right upper quadrant abdominal pain, nausea and emesis.  Labs reviewed without acute abnormalities. Gallbladder etiology not likely based on normal labs, but strict return precautions discussed.  Patient tolerating by mouth fluids in the emergency department.  We'll discharge with pain medication and primary care followup.  Resource guide given.        Jaci Carrel, [place] 09/19/12 1942

## 2012-09-19 NOTE — ED Provider Notes (Signed)
Medical screening examination/treatment/procedure(s) were performed by non-physician practitioner and as supervising physician I was immediately available for consultation/collaboration.    Celene Kras, MD 09/19/12 2006

## 2012-09-19 NOTE — ED Notes (Signed)
Pt c/o onset of nausea last night, reports vomiting twice today.c/o generalized abd pain "all over". Denies diarrhea, denies fever

## 2012-12-04 ENCOUNTER — Emergency Department (HOSPITAL_COMMUNITY)
Admission: EM | Admit: 2012-12-04 | Discharge: 2012-12-04 | Disposition: A | Discharge status: Home / Self Care | Payer: Self-pay | Attending: Emergency Medicine | Admitting: Emergency Medicine

## 2012-12-04 ENCOUNTER — Encounter (HOSPITAL_COMMUNITY): Payer: Self-pay | Admitting: Emergency Medicine

## 2012-12-04 ENCOUNTER — Emergency Department (HOSPITAL_COMMUNITY): Payer: Self-pay

## 2012-12-04 DIAGNOSIS — Z3202 Encounter for pregnancy test, result negative: Secondary | ICD-10-CM | POA: Insufficient documentation

## 2012-12-04 DIAGNOSIS — R109 Unspecified abdominal pain: Secondary | ICD-10-CM | POA: Insufficient documentation

## 2012-12-04 DIAGNOSIS — M549 Dorsalgia, unspecified: Secondary | ICD-10-CM | POA: Insufficient documentation

## 2012-12-04 DIAGNOSIS — R197 Diarrhea, unspecified: Secondary | ICD-10-CM | POA: Insufficient documentation

## 2012-12-04 DIAGNOSIS — F17200 Nicotine dependence, unspecified, uncomplicated: Secondary | ICD-10-CM | POA: Insufficient documentation

## 2012-12-04 DIAGNOSIS — G8929 Other chronic pain: Secondary | ICD-10-CM | POA: Insufficient documentation

## 2012-12-04 DIAGNOSIS — Z79899 Other long term (current) drug therapy: Secondary | ICD-10-CM | POA: Insufficient documentation

## 2012-12-04 DIAGNOSIS — Z880 Allergy status to penicillin: Secondary | ICD-10-CM | POA: Insufficient documentation

## 2012-12-04 DIAGNOSIS — J45909 Unspecified asthma, uncomplicated: Secondary | ICD-10-CM | POA: Insufficient documentation

## 2012-12-04 DIAGNOSIS — E282 Polycystic ovarian syndrome: Secondary | ICD-10-CM | POA: Insufficient documentation

## 2012-12-04 DIAGNOSIS — R110 Nausea: Secondary | ICD-10-CM | POA: Insufficient documentation

## 2012-12-04 DIAGNOSIS — R51 Headache: Secondary | ICD-10-CM | POA: Insufficient documentation

## 2012-12-04 LAB — COMPREHENSIVE METABOLIC PANEL
BUN: 11 mg/dL (ref 6–23)
CO2: 29 mEq/L (ref 19–32)
Calcium: 9.1 mg/dL (ref 8.4–10.5)
Creatinine, Ser: 0.77 mg/dL (ref 0.50–1.10)
GFR calc Af Amer: >90 mL/min (ref >90)
GFR calc non Af Amer: >90 mL/min (ref >90)
Glucose, Bld: 86 mg/dL (ref 70–99)

## 2012-12-04 LAB — CBC WITH DIFFERENTIAL/PLATELET
Basophils Absolute: 0 10*3/uL (ref 0.0–0.1)
Eosinophils Relative: 3 % (ref 0–5)
HCT: 39 % (ref 36.0–46.0)
Lymphocytes Relative: 31 % (ref 12–46)
Lymphs Abs: 2.4 10*3/uL (ref 0.7–4.0)
MCV: 83 fL (ref 78.0–100.0)
Monocytes Absolute: 0.7 10*3/uL (ref 0.1–1.0)
RDW: 14.9 % (ref 11.5–15.5)
WBC: 7.8 10*3/uL (ref 4.0–10.5)

## 2012-12-04 LAB — LIPASE, BLOOD: Lipase: 15 U/L (ref 11–59)

## 2012-12-04 LAB — URINALYSIS, ROUTINE W REFLEX MICROSCOPIC
Leukocytes, UA: NEGATIVE
Protein, ur: NEGATIVE mg/dL
Urobilinogen, UA: 0.2 mg/dL (ref 0.0–1.0)

## 2012-12-04 LAB — POCT PREGNANCY, URINE: Preg Test, Ur: NEGATIVE

## 2012-12-04 LAB — CG4 I-STAT (LACTIC ACID): Lactic Acid, Venous: 0.56 mmol/L (ref 0.5–2.2)

## 2012-12-04 MED ORDER — HYDROCODONE-ACETAMINOPHEN 5-325 MG PO TABS: 1.0000 | ORAL_TABLET | Freq: Three times a day (TID) | ORAL | Status: DC | PRN

## 2012-12-04 MED ORDER — ONDANSETRON HCL 4 MG PO TABS: 4.0000 mg | ORAL_TABLET | Freq: Four times a day (QID) | ORAL | Status: DC

## 2012-12-04 MED ORDER — ONDANSETRON 4 MG PO TBDP
4.0000 mg | ORAL_TABLET | Freq: Once | ORAL | Status: AC
  Administered 2012-12-04: 4 mg via ORAL
  Filled 2012-12-04: qty 1

## 2012-12-04 MED ORDER — GI COCKTAIL ~~LOC~~
30.0000 mL | Freq: Once | ORAL | Status: AC
  Administered 2012-12-04: 30 mL via ORAL
  Filled 2012-12-04: qty 30

## 2012-12-04 MED ORDER — FAMOTIDINE 20 MG PO TABS: 20.0000 mg | ORAL_TABLET | Freq: Two times a day (BID) | ORAL | Status: DC

## 2012-12-04 NOTE — ED Notes (Signed)
Pt c/o sharp abdominal pain on L side of abdomen. Pt states pain radiates from L side to mid abdomen. Pt is intermittent and sharp per pt. Pt states she has had the pain off and on for months and has been evaluated for same at ED. Pt states she was supposed to have an ultra sound the last time she was in the ED, but decided she didn't want to stay any longer. Pt states she has slight nausea and had diarrhea earlier today. Pt ambulatory to exam room with steady gait. Pt states she drove herself here.

## 2012-12-04 NOTE — ED Provider Notes (Signed)
Medical screening examination/treatment/procedure(s) were performed by non-physician practitioner and as supervising physician I was immediately available for consultation/collaboration.   Amariah Kierstead H Gaia Gullikson, MD 12/04/12 2316 

## 2012-12-04 NOTE — ED Provider Notes (Signed)
History    CSN: [id number] Arrival date & time 12/04/12  1615  First MD Initiated Contact with Patient 12/04/12 1650     Chief Complaint  Patient presents with  . Abdominal Pain   (Consider location/radiation/quality/duration/timing/severity/associated sxs/prior Treatment) The history is provided by the patient. No language interpreter was used.  Tasha Hughes is a 22 y/o F with PMHx of asthma, PCOS presenting to the ED with abdominal pain that has been ongoing for the past 7 months, stated that the abdominal pain is localized to the LUQ with radiation to the epigastric region described as an intermittent sharp, shooting pain that is episodic lasting approximately 1 hour at a time. Patient reported that the pain is worse in the mornings, calms down within to 1 hour, drinking a lot of water helps. Stated that when she eats she is fine, but approximately 20 minutes after she eats she has discomfort. Stated that at times she does feel a burning sensation to the epigastric region when she eats. Stated that she has these episodes of discomfort approximately 3-4 times per day. Stated that has been having intermittent nausea, reported 4 episodes of diarrhea today. Stated that she has not been taking anything for the abdominal pain. Denied fever, chills, pelvic pain, flank pain, vomiting, melena, hematochezia, abdominal surgery, chest pain, shortness of breath, difficulty breathing, vaginal pain, vaginal discharge, vaginal bleeding, chronic NSAID use. PCP none  Patient reported that she was here is 08/2012 for the same complaint - reported that she was supposed to get UA, but did not because of the fact that she had to leave.  Reported still having appendix and gallbladder.   Past Medical History  Diagnosis Date  . Asthma   . Polycystic ovarian disease    Past Surgical History  Procedure Laterality Date  . Tonsillectomy     History reviewed. No pertinent family history. History    Substance Use Topics  . Smoking status: Current Every Day Smoker -- 1.00 packs/day    Types: Cigarettes  . Smokeless tobacco: Not on file  . Alcohol Use: Yes     Comment: rarely   OB History   Grav Para Term Preterm Abortions TAB SAB Ect Mult Living                 Review of Systems  Constitutional: Negative for fever and chills.  HENT: Negative for neck pain.   Eyes: Negative for pain and visual disturbance.  Respiratory: Negative for cough, chest tightness and shortness of breath.   Cardiovascular: Negative for chest pain.  Gastrointestinal: Positive for nausea, abdominal pain and diarrhea. Negative for vomiting, constipation, blood in stool, anal bleeding and rectal pain.  Genitourinary: Negative for dysuria, flank pain, vaginal bleeding, vaginal discharge, difficulty urinating, vaginal pain and pelvic pain.  Musculoskeletal: Positive for back pain (baseline for patient).  Neurological: Positive for headaches. Negative for dizziness, weakness, light-headedness and numbness.  All other systems reviewed and are negative.    Allergies  Penicillins; Robitussin; and Shrimp  Home Medications   Current Outpatient Rx  Name  Route  Sig  Dispense  Refill  . Aspirin-Salicylamide-Caffeine (BC HEADACHE POWDER PO)   Oral   Take 1 packet by mouth daily as needed (Headache). For headache         . albuterol (PROVENTIL HFA;VENTOLIN HFA) 108 (90 BASE) MCG/ACT inhaler   Inhalation   Inhale 2 puffs into the lungs every 6 (six) hours as needed for wheezing or shortness of breath.         Marland Kitchen  famotidine (PEPCID) 20 MG tablet   Oral   Take 1 tablet (20 mg total) by mouth 2 (two) times daily.   30 tablet   0   . HYDROcodone-acetaminophen (NORCO) 5-325 MG per tablet   Oral   Take 1 tablet by mouth every 8 (eight) hours as needed for pain.   7 tablet   0   . ondansetron (ZOFRAN) 4 MG tablet   Oral   Take 1 tablet (4 mg total) by mouth every 6 (six) hours.   12 tablet   0     BP 114/76  Pulse 76  Temp(Src) 98.9 F (37.2 C) (Oral)  Resp 16  SpO2 100%  LMP 10/04/2012 Physical Exam  Nursing note and vitals reviewed. Constitutional: She is oriented to person, place, and time. She appears well-developed. No distress.  HENT:  Head: Normocephalic and atraumatic.  Eyes: Conjunctivae and EOM are normal. Pupils are equal, round, and reactive to light. Right eye exhibits no discharge. Left eye exhibits no discharge.  Neck: Normal range of motion. Neck supple.  Cardiovascular: Normal rate, regular rhythm and normal heart sounds.  Exam reveals no friction rub.   No murmur heard. Pulmonary/Chest: Effort normal and breath sounds normal. No respiratory distress. She has no wheezes. She has no rales.  Abdominal: Soft. Bowel sounds are normal. She exhibits no distension. There is no hepatosplenomegaly. There is tenderness in the epigastric area and left upper quadrant. There is no rigidity, no rebound, no guarding, no tenderness at McBurney's point and negative Murphy's sign.    Negative Murphy's sign Negative Obturator Negative Psoas Obese   Lymphadenopathy:    She has no cervical adenopathy.  Neurological: She is alert and oriented to person, place, and time. No cranial nerve deficit. She exhibits normal muscle tone. Coordination normal.  Skin: Skin is warm and dry. No rash noted. She is not diaphoretic. No erythema.  Psychiatric: She has a normal mood and affect. Her behavior is normal. Thought content normal.    ED Course  Procedures (including critical care time)   Medications  gi cocktail (Maalox,Lidocaine,Donnatal) (30 mLs Oral Given 12/04/12 1809)  ondansetron (ZOFRAN-ODT) disintegrating tablet 4 mg (4 mg Oral Given 12/04/12 1808)    Labs Reviewed  COMPREHENSIVE METABOLIC PANEL - Abnormal; Notable for the following:    Albumin 3.2 (*)    Total Bilirubin 0.1 (*)    All other components within normal limits  CBC WITH DIFFERENTIAL  LIPASE, BLOOD   URINALYSIS, ROUTINE W REFLEX MICROSCOPIC  POCT PREGNANCY, URINE  CG4 I-STAT (LACTIC ACID)   US Abdomen Complete  12/04/2012   *RADIOLOGY REPORT*  Clinical Data:  Left upper quadrant abdominal pain, nausea and diarrhea.  COMPLETE ABDOMINAL ULTRASOUND  Comparison:  None.  Findings:  Gallbladder:  No gallstones, gallbladder wall thickening, or pericholecystic fluid.  Common bile duct:  Normal, measuring 3.8 mm in diameter proximally.  Liver:  No focal lesion identified.  Within normal limits in parenchymal echogenicity.  IVC:  Appears normal.  Pancreas:  No focal abnormality seen.  Spleen:  Normal, measuring 7.5 cm in length.  Right Kidney:  Normal, measuring 12.3 cm in length.  Left Kidney:  Normal, measuring 12.3 cm in length as well.  Abdominal aorta:  Obscured distally.  No visible aneurysm.  IMPRESSION: No acute abnormality.   Original Report Authenticated By: Beckie Salts, M.D.   1. Chronic abdominal pain   2. PCOS (polycystic ovarian syndrome)   3. Asthma, unspecified asthma severity, uncomplicated  MDM  Patient presenting to the ED with abdominal pain localized to the LLQ and epigastric region that has been ongoing for the past 7 months, reported that the discomfort started in January 2014. Reported discomfort to be of a burning sensation after eating, with intermittent sharp shooting pains that last about an hour. Stated that the pain is worse in the mornings and 20 minutes after eating meals. Reported that she eats fast food at least 2 times per week and eats a lot of spicy foods because mother is [place] and [place].   Negative acute abdomen, negative peritoneal signs. Negative psoas, obturator. Lactic acid negative elevation. UA negative findings - negative infections. CBC negative findings. CMP negative findings. Lipase negative elevation. UA abdomen negative findings noted - negative inflammatory processes noted. Doubt pancreatitis. Pain controlled in ED setting. Suspicion to be GERD,  possible gastritis. Patient stable, afebrile. Discharged patient. [facility]erred [facility] to [facility]. Discharged patient with pepcid, anti-emetics, pain medications - discussed course, disposal techniques. Discussed with patient to refrain from eating foods high in fat and grease, did not recommend fast food, discussed with patient to decrease spicy food intake as well as NSAID use if any. Discussed with patient to rest and stay hydrated. Discussed with patient to continue to monitor symptoms and if symptoms are to worsen or change to report back to the ED - strict return instructions given. Patient agreed to plan of care, understood, all questions answered.   Raymon Mutton, PA-C 12/04/12 2100

## 2012-12-04 NOTE — ED Notes (Signed)
Pt is dressed and ready to go,  Will notify PA

## 2012-12-07 LAB — POCT PREGNANCY, URINE: Preg Test, Ur: NEGATIVE

## 2013-03-01 ENCOUNTER — Emergency Department (HOSPITAL_COMMUNITY): Payer: No Typology Code available for payment source

## 2013-03-01 ENCOUNTER — Emergency Department (HOSPITAL_COMMUNITY)
Admission: EM | Admit: 2013-03-01 | Discharge: 2013-03-01 | Disposition: A | Discharge status: Home / Self Care | Payer: Self-pay | Attending: Emergency Medicine | Admitting: Emergency Medicine

## 2013-03-01 ENCOUNTER — Encounter (HOSPITAL_COMMUNITY): Payer: Self-pay

## 2013-03-01 DIAGNOSIS — Z8639 Personal history of other endocrine, nutritional and metabolic disease: Secondary | ICD-10-CM | POA: Insufficient documentation

## 2013-03-01 DIAGNOSIS — R300 Dysuria: Secondary | ICD-10-CM | POA: Insufficient documentation

## 2013-03-01 DIAGNOSIS — Z862 Personal history of diseases of the blood and blood-forming organs and certain disorders involving the immune mechanism: Secondary | ICD-10-CM | POA: Insufficient documentation

## 2013-03-01 DIAGNOSIS — R1084 Generalized abdominal pain: Secondary | ICD-10-CM | POA: Insufficient documentation

## 2013-03-01 DIAGNOSIS — Z79899 Other long term (current) drug therapy: Secondary | ICD-10-CM | POA: Insufficient documentation

## 2013-03-01 DIAGNOSIS — J45909 Unspecified asthma, uncomplicated: Secondary | ICD-10-CM | POA: Insufficient documentation

## 2013-03-01 DIAGNOSIS — Z3202 Encounter for pregnancy test, result negative: Secondary | ICD-10-CM | POA: Insufficient documentation

## 2013-03-01 DIAGNOSIS — F17200 Nicotine dependence, unspecified, uncomplicated: Secondary | ICD-10-CM | POA: Insufficient documentation

## 2013-03-01 DIAGNOSIS — R109 Unspecified abdominal pain: Secondary | ICD-10-CM

## 2013-03-01 DIAGNOSIS — Z880 Allergy status to penicillin: Secondary | ICD-10-CM | POA: Insufficient documentation

## 2013-03-01 DIAGNOSIS — R112 Nausea with vomiting, unspecified: Secondary | ICD-10-CM | POA: Insufficient documentation

## 2013-03-01 DIAGNOSIS — K219 Gastro-esophageal reflux disease without esophagitis: Secondary | ICD-10-CM | POA: Insufficient documentation

## 2013-03-01 HISTORY — DX: Gastro-esophageal reflux disease without esophagitis: K21.9

## 2013-03-01 LAB — CBC WITH DIFFERENTIAL/PLATELET
Eosinophils Absolute: 0.1 10*3/uL (ref 0.0–0.7)
Eosinophils Relative: 1 % (ref 0–5)
Hemoglobin: 13.2 g/dL (ref 12.0–15.0)
Lymphs Abs: 1.5 10*3/uL (ref 0.7–4.0)
MCH: 26.9 pg (ref 26.0–34.0)
MCHC: 32.5 g/dL (ref 30.0–36.0)
MCV: 82.7 fL (ref 78.0–100.0)
Monocytes Absolute: 1 10*3/uL (ref 0.1–1.0)
Monocytes Relative: 7 % (ref 3–12)
RBC: 4.91 MIL/uL (ref 3.87–5.11)

## 2013-03-01 LAB — LIPASE, BLOOD: Lipase: 11 U/L (ref 11–59)

## 2013-03-01 LAB — WET PREP, GENITAL: Yeast Wet Prep HPF POC: NONE SEEN

## 2013-03-01 LAB — URINALYSIS, ROUTINE W REFLEX MICROSCOPIC
Hgb urine dipstick: NEGATIVE
Nitrite: NEGATIVE
Specific Gravity, Urine: 1.023 (ref 1.005–1.030)
Urobilinogen, UA: 0.2 mg/dL (ref 0.0–1.0)
pH: 6 (ref 5.0–8.0)

## 2013-03-01 LAB — POCT PREGNANCY, URINE: Preg Test, Ur: NEGATIVE

## 2013-03-01 LAB — COMPREHENSIVE METABOLIC PANEL
Alkaline Phosphatase: 78 U/L (ref 39–117)
BUN: 10 mg/dL (ref 6–23)
Calcium: 9.3 mg/dL (ref 8.4–10.5)
Creatinine, Ser: 0.52 mg/dL (ref 0.50–1.10)
GFR calc Af Amer: >90 mL/min (ref >90)
Glucose, Bld: 84 mg/dL (ref 70–99)
Total Protein: 7.5 g/dL (ref 6.0–8.3)

## 2013-03-01 MED ORDER — ONDANSETRON 4 MG PO TBDP: 4.0000 mg | ORAL_TABLET | Freq: Three times a day (TID) | ORAL | Status: DC | PRN

## 2013-03-01 MED ORDER — ONDANSETRON HCL 4 MG/2ML IJ SOLN
4.0000 mg | Freq: Once | INTRAMUSCULAR | Status: AC
  Administered 2013-03-01: 4 mg via INTRAVENOUS
  Filled 2013-03-01: qty 2

## 2013-03-01 MED ORDER — IOHEXOL 300 MG/ML  SOLN
100.0000 mL | Freq: Once | INTRAMUSCULAR | Status: AC | PRN
  Administered 2013-03-01: 100 mL via INTRAVENOUS

## 2013-03-01 MED ORDER — IBUPROFEN 800 MG PO TABS
800.0000 mg | ORAL_TABLET | Freq: Once | ORAL | Status: AC
  Administered 2013-03-01: 800 mg via ORAL
  Filled 2013-03-01: qty 1

## 2013-03-01 MED ORDER — IOHEXOL 300 MG/ML  SOLN
50.0000 mL | Freq: Once | INTRAMUSCULAR | Status: AC | PRN
  Administered 2013-03-01: 50 mL via ORAL

## 2013-03-01 MED ORDER — MORPHINE SULFATE 4 MG/ML IJ SOLN
4.0000 mg | Freq: Once | INTRAMUSCULAR | Status: AC
  Administered 2013-03-01: 4 mg via INTRAVENOUS
  Filled 2013-03-01: qty 1

## 2013-03-01 NOTE — ED Provider Notes (Signed)
Pt received from PA Szelkalski at shift change.  Pt presenting to the ED for lower abdominal pain, onset this morning, associated with some nausea and vomiting.  Normal BM.  No chest pain, SOB, vaginal complaints.  Labs and pelvic completed-- mild leukocytosis at 13.7, wet prep with few clue cells.  Plan-- CT scan pending.  If negative, d/c home.  Results for orders placed during the hospital encounter of 03/01/13  WET PREP, GENITAL      Result Value Range   Yeast Wet Prep HPF POC NONE SEEN  NONE SEEN   Trich, Wet Prep NONE SEEN  NONE SEEN   Clue Cells Wet Prep HPF POC FEW (*) NONE SEEN   WBC, Wet Prep HPF POC RARE (*) NONE SEEN  URINALYSIS, ROUTINE W REFLEX MICROSCOPIC      Result Value Range   Color, Urine YELLOW  YELLOW   APPearance CLEAR  CLEAR   Specific Gravity, Urine 1.023  1.005 - 1.030   pH 6.0  5.0 - 8.0   Glucose, UA NEGATIVE  NEGATIVE mg/dL   Hgb urine dipstick NEGATIVE  NEGATIVE   Bilirubin Urine NEGATIVE  NEGATIVE   Ketones, ur NEGATIVE  NEGATIVE mg/dL   Protein, ur NEGATIVE  NEGATIVE mg/dL   Urobilinogen, UA 0.2  0.0 - 1.0 mg/dL   Nitrite NEGATIVE  NEGATIVE   Leukocytes, UA NEGATIVE  NEGATIVE  CBC WITH DIFFERENTIAL      Result Value Range   WBC 13.7 (*) 4.0 - 10.5 K/uL   RBC 4.91  3.87 - 5.11 MIL/uL   Hemoglobin 13.2  12.0 - 15.0 g/dL   HCT 40.9  81.1 - 91.4 %   MCV 82.7  78.0 - 100.0 fL   MCH 26.9  26.0 - 34.0 pg   MCHC 32.5  30.0 - 36.0 g/dL   RDW 78.2  95.6 - 21.3 %   Platelets 368  150 - 400 K/uL   Neutrophils Relative % 81 (*) 43 - 77 %   Neutro Abs 11.1 (*) 1.7 - 7.7 K/uL   Lymphocytes Relative 11 (*) 12 - 46 %   Lymphs Abs 1.5  0.7 - 4.0 K/uL   Monocytes Relative 7  3 - 12 %   Monocytes Absolute 1.0  0.1 - 1.0 K/uL   Eosinophils Relative 1  0 - 5 %   Eosinophils Absolute 0.1  0.0 - 0.7 K/uL   Basophils Relative 0  0 - 1 %   Basophils Absolute 0.0  0.0 - 0.1 K/uL  COMPREHENSIVE METABOLIC PANEL      Result Value Range   Sodium 136  135 - 145 mEq/L    Potassium 3.7  3.5 - 5.1 mEq/L   Chloride 102  96 - 112 mEq/L   CO2 25  19 - 32 mEq/L   Glucose, Bld 84  70 - 99 mg/dL   BUN 10  6 - 23 mg/dL   Creatinine, Ser 0.86  0.50 - 1.10 mg/dL   Calcium 9.3  8.4 - 57.8 mg/dL   Total Protein 7.5  6.0 - 8.3 g/dL   Albumin 3.4 (*) 3.5 - 5.2 g/dL   AST 14  0 - 37 U/L   ALT 20  0 - 35 U/L   Alkaline Phosphatase 78  39 - 117 U/L   Total Bilirubin 0.2 (*) 0.3 - 1.2 mg/dL   GFR calc non Af Amer >90  >90 mL/min   GFR calc Af Amer >90  >90 mL/min  LIPASE, BLOOD  Result Value Range   Lipase 11  11 - 59 U/L  POCT PREGNANCY, URINE      Result Value Range   Preg Test, Ur NEGATIVE  NEGATIVE   Ct Abdomen Pelvis W Contrast  03/01/2013   CLINICAL DATA:  Bilateral lower abdominal pain.  EXAM: CT ABDOMEN AND PELVIS WITH CONTRAST  TECHNIQUE: Multidetector CT imaging of the abdomen and pelvis was performed using the standard protocol following bolus administration of intravenous contrast.  CONTRAST:  50mL OMNIPAQUE IOHEXOL 300 MG/ML SOLN, OMNIPAQUE IOHEXOL 300 MG/ML SOLN  COMPARISON:  None  FINDINGS: Limited visualization of the lower thorax demonstrates minimal dependent atelectasis within the left lower lobe. Normal heart size.  The liver, spleen, pancreas and bilateral adrenal glands are unremarkable. Kidneys enhance symmetrically with contrast. No hydronephrosis.  Normal caliber abdominal aorta. No retroperitoneal lymphadenopathy. Urinary bladder is unremarkable. Uterus and bilateral adnexa are grossly unremarkable.  No abnormal bowel wall thickening. No free fluid or free intraperitoneal air. No evidence for bowel obstruction.  No acute appearing osseous lesions.  IMPRESSION: No CT cause for acute abdominal pain identified.   Electronically Signed   By: Annia Belt M.D.   On: 03/01/2013 16:14    4:35 PM CT as above-- no significant findings.  Pt re-evaluated, lying comfortably in bed, NAD.  Informed of CT results, she acknowledged understanding.  Will  initiate PO challenge.  Pt tolerated PO without difficulty.  Pt afebrile, non-toxic appearing, NAD, VS stable- ok for discharge.  Rx zofran.  FU with [facility] if problems occur.  Discussed plan with pt, she agreed.  Return precautions advised.  Garlon Hatchet, PA-C 03/01/13 2232

## 2013-03-01 NOTE — ED Notes (Signed)
No Emesis taking po fludis

## 2013-03-01 NOTE — ED Provider Notes (Signed)
CSN: [id number]     Arrival date & time 03/01/13  4098 History   First MD Initiated Contact with Patient 03/01/13 1121     Chief Complaint  Patient presents with  . Abdominal Pain   (Consider location/radiation/quality/duration/timing/severity/associated sxs/prior Treatment) HPI Comments: Patient is a 22 year old female with a past medical history of PCOS, GERD and asthma who presents with abdominal pain that started this morning. The pain is located in her lower abdomen and does not radiate. The pain is described as aching and severe. The pain started gradually and progressively worsened since the onset. No alleviating/aggravating factors. The patient has tried nothing for symptoms without relief. Associated symptoms include nausea and vomiting. Patient denies fever, headache, diarrhea, chest pain, SOB, dysuria, constipation, abnormal vaginal bleeding/discharge. Patient reports last normal bowel movement was this morning.      Past Medical History  Diagnosis Date  . Asthma   . Polycystic ovarian disease   . GERD (gastroesophageal reflux disease)    Past Surgical History  Procedure Laterality Date  . Tonsillectomy     Family History  Problem Relation Age of Onset  . Hypertension Mother    History  Substance Use Topics  . Smoking status: Current Every Day Smoker -- 1.00 packs/day    Types: Cigarettes  . Smokeless tobacco: Never Used  . Alcohol Use: Yes     Comment: rarely   OB History   Grav Para Term Preterm Abortions TAB SAB Ect Mult Living                 Review of Systems  Gastrointestinal: Positive for nausea, vomiting and abdominal pain.  Genitourinary: Positive for dysuria.  All other systems reviewed and are negative.    Allergies  Penicillins; Robitussin; and Shrimp  Home Medications   Current Outpatient Rx  Name  Route  Sig  Dispense  Refill  . Aspirin-Salicylamide-Caffeine (BC HEADACHE POWDER PO)   Oral   Take 1 packet by mouth daily as needed  (Headache). For headache         . famotidine (PEPCID) 20 MG tablet   Oral   Take 20 mg by mouth 2 (two) times daily as needed for heartburn.         Marland Kitchen albuterol (PROVENTIL HFA;VENTOLIN HFA) 108 (90 BASE) MCG/ACT inhaler   Inhalation   Inhale 2 puffs into the lungs every 6 (six) hours as needed for wheezing or shortness of breath.          BP 105/56  Pulse 101  Temp(Src) 98.2 F (36.8 C) (Oral)  Resp 18  Ht 5\' 5"  (1.651 m)  Wt 265 lb (120.203 kg)  BMI 44.1 kg/m2  SpO2 98%  LMP 02/24/2013 Physical Exam  Nursing note and vitals reviewed. Constitutional: She is oriented to person, place, and time. She appears well-developed and well-nourished. No distress.  HENT:  Head: Normocephalic and atraumatic.  Eyes: Conjunctivae and EOM are normal. No scleral icterus.  Neck: Normal range of motion.  Cardiovascular: Normal rate and regular rhythm.  Exam reveals no gallop and no friction rub.   No murmur heard. Pulmonary/Chest: Effort normal and breath sounds normal. She has no wheezes. She has no rales. She exhibits no tenderness.  Abdominal: Soft. She exhibits no distension. There is tenderness. There is no rebound and no guarding.  Generalized lower abdominal tenderness to palpation, left>right. No peritoneal signs or other focal tenderness to palpation.   Musculoskeletal: Normal range of motion.  Neurological: She is alert and  oriented to person, place, and time. Coordination normal.  Speech is goal-oriented. Moves limbs without ataxia.   Skin: Skin is warm and dry.  Psychiatric: She has a normal mood and affect. Her behavior is normal.    ED Course  Procedures (including critical care time) Labs Review Labs Reviewed  WET PREP, GENITAL - Abnormal; Notable for the following:    Clue Cells Wet Prep HPF POC FEW (*)    WBC, Wet Prep HPF POC RARE (*)    All other components within normal limits  CBC WITH DIFFERENTIAL - Abnormal; Notable for the following:    WBC 13.7 (*)     Neutrophils Relative % 81 (*)    Neutro Abs 11.1 (*)    Lymphocytes Relative 11 (*)    All other components within normal limits  COMPREHENSIVE METABOLIC PANEL - Abnormal; Notable for the following:    Albumin 3.4 (*)    Total Bilirubin 0.2 (*)    All other components within normal limits  URINE CULTURE  GC/CHLAMYDIA PROBE AMP  URINALYSIS, ROUTINE W REFLEX MICROSCOPIC  LIPASE, BLOOD  POCT PREGNANCY, URINE   Imaging Review Ct Abdomen Pelvis W Contrast  03/01/2013   CLINICAL DATA:  Bilateral lower abdominal pain.  EXAM: CT ABDOMEN AND PELVIS WITH CONTRAST  TECHNIQUE: Multidetector CT imaging of the abdomen and pelvis was performed using the standard protocol following bolus administration of intravenous contrast.  CONTRAST:  50mL OMNIPAQUE IOHEXOL 300 MG/ML SOLN, OMNIPAQUE IOHEXOL 300 MG/ML SOLN  COMPARISON:  None  FINDINGS: Limited visualization of the lower thorax demonstrates minimal dependent atelectasis within the left lower lobe. Normal heart size.  The liver, spleen, pancreas and bilateral adrenal glands are unremarkable. Kidneys enhance symmetrically with contrast. No hydronephrosis.  Normal caliber abdominal aorta. No retroperitoneal lymphadenopathy. Urinary bladder is unremarkable. Uterus and bilateral adnexa are grossly unremarkable.  No abnormal bowel wall thickening. No free fluid or free intraperitoneal air. No evidence for bowel obstruction.  No acute appearing osseous lesions.  IMPRESSION: No CT cause for acute abdominal pain identified.   Electronically Signed   By: Annia Belt M.D.   On: 03/01/2013 16:14    MDM   1. Abdominal  pain, other specified site   2. Nausea and vomiting     12:28 PM Labs show elevated WBC. Patient slightly tachycardic with other vitals stable. Patient will have pelvic exam. Patient will have morphine and zofran for pain.   2:37 PM Pelvic exam done. Patient will have CT scan due to LLQ tenderness to palpation and elevated WBC.   3:14 PM CT  scan pending. Patient signed out to Sharilyn Sites, PA-C for disposition.     Emilia Beck, [place] 03/02/13 [phone]

## 2013-03-01 NOTE — ED Notes (Signed)
Patient denies any international travel or been around anyone who has.

## 2013-03-01 NOTE — ED Notes (Signed)
Patient transported to CT 

## 2013-03-01 NOTE — Progress Notes (Signed)
[facility] provided pt with a [facility] [employer] Card application.  °

## 2013-03-01 NOTE — ED Notes (Signed)
Patient reports having bilateral lower abdominal pain. Patient c/o dysuria/pressure when urinating. Patient denies any vaginal discharge or urinary frequency.

## 2013-03-02 LAB — GC/CHLAMYDIA PROBE AMP
CT Probe RNA: POSITIVE — AB
GC Probe RNA: NEGATIVE

## 2013-03-02 LAB — URINE CULTURE: Colony Count: NO GROWTH

## 2013-03-02 NOTE — ED Provider Notes (Signed)
Medical screening examination/treatment/procedure(s) were performed by non-physician practitioner and as supervising physician I was immediately available for consultation/collaboration.  Tasha Friel L Alfred Eckley, MD 03/02/13 0056 

## 2013-03-03 ENCOUNTER — Telehealth (HOSPITAL_COMMUNITY): Payer: Self-pay | Admitting: Emergency Medicine

## 2013-03-03 NOTE — ED Notes (Signed)
Patient has +Chlamydia. 

## 2013-03-03 NOTE — ED Notes (Signed)
+  Chlamydia. Chart sent to EDP office for review. [facility] attached. 

## 2013-03-04 NOTE — ED Provider Notes (Signed)
Medical screening examination/treatment/procedure(s) were performed by non-physician practitioner and as supervising physician I was immediately available for consultation/collaboration.   Richardean Canal, MD 03/04/13 1452

## 2013-03-07 ENCOUNTER — Telehealth (HOSPITAL_COMMUNITY): Payer: Self-pay | Admitting: Emergency Medicine

## 2013-03-07 NOTE — ED Notes (Signed)
Chart returned from EDP office. Per Fayrene Helper PA-C, give Zithromax 1000 mg PO once.

## 2013-03-15 ENCOUNTER — Encounter (HOSPITAL_COMMUNITY): Payer: Self-pay | Admitting: Emergency Medicine

## 2013-03-15 ENCOUNTER — Emergency Department (HOSPITAL_COMMUNITY)
Admission: EM | Admit: 2013-03-15 | Discharge: 2013-03-15 | Disposition: A | Discharge status: Home / Self Care | Payer: No Typology Code available for payment source | Attending: Emergency Medicine | Admitting: Emergency Medicine

## 2013-03-15 DIAGNOSIS — Z880 Allergy status to penicillin: Secondary | ICD-10-CM | POA: Insufficient documentation

## 2013-03-15 DIAGNOSIS — J45909 Unspecified asthma, uncomplicated: Secondary | ICD-10-CM | POA: Insufficient documentation

## 2013-03-15 DIAGNOSIS — Z8742 Personal history of other diseases of the female genital tract: Secondary | ICD-10-CM | POA: Insufficient documentation

## 2013-03-15 DIAGNOSIS — Z79899 Other long term (current) drug therapy: Secondary | ICD-10-CM | POA: Insufficient documentation

## 2013-03-15 DIAGNOSIS — A749 Chlamydial infection, unspecified: Secondary | ICD-10-CM | POA: Insufficient documentation

## 2013-03-15 DIAGNOSIS — R197 Diarrhea, unspecified: Secondary | ICD-10-CM | POA: Insufficient documentation

## 2013-03-15 DIAGNOSIS — Z8719 Personal history of other diseases of the digestive system: Secondary | ICD-10-CM | POA: Insufficient documentation

## 2013-03-15 DIAGNOSIS — F17200 Nicotine dependence, unspecified, uncomplicated: Secondary | ICD-10-CM | POA: Insufficient documentation

## 2013-03-15 MED ORDER — AZITHROMYCIN 250 MG PO TABS
1000.0000 mg | ORAL_TABLET | Freq: Once | ORAL | Status: AC
  Administered 2013-03-15: 1000 mg via ORAL
  Filled 2013-03-15: qty 4

## 2013-03-15 NOTE — ED Notes (Signed)
Pt states abd pain is same as last seen. Has not taken abx, case management speaking with pt.

## 2013-03-15 NOTE — ED Notes (Signed)
Patient was seen in the ED 03/01/13 with c/o abdominal pain. Patient states she was notified on 03/10/13 that she was positive for chlamydia and reports that she did not get the antibiotics because of no money to do so.

## 2013-03-15 NOTE — Progress Notes (Signed)
[facility] provided pt with a list of primary care resources and a [facility] [employer] Card application.  °

## 2013-03-15 NOTE — ED Provider Notes (Signed)
CSN: [id number]     Arrival date & time 03/15/13  1154 History   First MD Initiated Contact with Patient 03/15/13 1216     Chief Complaint  Patient presents with  . Abdominal Pain  . Diarrhea   (Consider location/radiation/quality/duration/timing/severity/associated sxs/prior Treatment) HPI Comments: Patient is a 22 year old female with history of asthma, polycystic ovarian disease, GERD who presents today after receiving a phone call telling her she tested positive for Chlamydia. She was seen in the emergency Department for lower abdominal pain which is chronic for her. He performed a pelvic exam which showed no red flags on 10/7. She has had no new sexual interactions sense she was in the emergency department being tested for chlamydia and gonorrhea. She has no new pain. No vaginal discharge, foul smell, urinary symptoms including dysuria, urinary urgency, urinary frequency. She has had no fevers, vomiting. She refuses any additional tests or pelvic exam. She would only like her treatment for Chlamydia. She was unable to fill her prescription at the pharmacy due to financial restraints.  The history is provided by the patient. No language interpreter was used.    Past Medical History  Diagnosis Date  . Asthma   . Polycystic ovarian disease   . GERD (gastroesophageal reflux disease)    Past Surgical History  Procedure Laterality Date  . Tonsillectomy     Family History  Problem Relation Age of Onset  . Hypertension Mother    History  Substance Use Topics  . Smoking status: Current Every Day Smoker -- 1.00 packs/day    Types: Cigarettes  . Smokeless tobacco: Never Used  . Alcohol Use: Yes     Comment: rarely   OB History   Grav Para Term Preterm Abortions TAB SAB Ect Mult Living                 Review of Systems  Constitutional: Negative for fever and chills.  Gastrointestinal: Positive for abdominal pain. Negative for nausea and vomiting.  Genitourinary: Negative for  dysuria, frequency, vaginal bleeding, vaginal discharge, vaginal pain and pelvic pain.  All other systems reviewed and are negative.    Allergies  Penicillins; Robitussin; and Shrimp  Home Medications   Current Outpatient Rx  Name  Route  Sig  Dispense  Refill  . albuterol (PROVENTIL HFA;VENTOLIN HFA) 108 (90 BASE) MCG/ACT inhaler   Inhalation   Inhale 2 puffs into the lungs every 6 (six) hours as needed for wheezing or shortness of breath.         . Aspirin-Salicylamide-Caffeine (BC HEADACHE POWDER PO)   Oral   Take 1 packet by mouth daily as needed (Headache). For headache          BP 121/69  Pulse 81  Temp(Src) 98.5 F (36.9 C) (Oral)  Resp 16  Ht 5\' 5"  (1.651 m)  Wt 265 lb (120.203 kg)  BMI 44.1 kg/m2  SpO2 97%  LMP 02/24/2013 Physical Exam  Nursing note and vitals reviewed. Constitutional: She is oriented to person, place, and time. She appears well-developed and well-nourished. No distress.  HENT:  Head: Normocephalic and atraumatic.  Right Ear: External ear normal.  Left Ear: External ear normal.  Nose: Nose normal.  Mouth/Throat: Oropharynx is clear and moist.  Eyes: Conjunctivae are normal.  Neck: Normal range of motion.  Cardiovascular: Normal rate, regular rhythm and normal heart sounds.   Pulmonary/Chest: Effort normal and breath sounds normal. No stridor. No respiratory distress. She has no wheezes. She has no rales.  Abdominal:  Soft. She exhibits no distension. There is tenderness (mild) in the suprapubic area. There is no rigidity, no rebound and no guarding.  Genitourinary:  Deferred due to patient request.  Musculoskeletal: Normal range of motion.  Neurological: She is alert and oriented to person, place, and time. She has normal strength.  Skin: Skin is warm and dry. She is not diaphoretic. No erythema.  Psychiatric: She has a normal mood and affect. Her behavior is normal.    ED Course  Procedures (including critical care time) Labs  Review Labs Reviewed - No data to display Imaging Review No results found.  EKG Interpretation   None       MDM   1. Chlamydia    Patient presents to emergency department after being called with positive Chlamydia result. She refused additional pelvic exam. I discussed that it is possible she could have progressed to have PID. She reports that she has no change in any of her symptoms. She had no vaginal discharge, vaginal discomfort. She had mild suprapubic cramping that has been going on for many years. I discussed that she should refrain from having sexual intercourse for the next 10 days. She should have her partners treated for Chlamydia. She was given 1000 mg of azithromycin in the emergency department. Advised to follow up at health department for STD panel. Return instructions given. Vital signs stable for discharge. Patient / Family / Caregiver informed of clinical course, understand medical decision-making process, and agree with plan.     Mora Bellman, PA-C 03/15/13 1429

## 2013-03-15 NOTE — Progress Notes (Signed)
   CARE MANAGEMENT ED NOTE 03/15/2013  Patient:  [facility]   Account Number:  [id number]  Date Initiated:  03/15/2013  Documentation initiated by:  Edd Arbour  Subjective/Objective Assessment:   22 yr old female self pay [place] resident     Subjective/Objective Assessment Detail:   Pt called and left a voice message with ED [place] stating she was seen in [facility] ED 03/01/13 and was called and informed she needed an antibiotic but pt did not get one related to cost Pt encouraged by ED [place] to return to ED Pt returned prior to CM contacting her  Pt reports recently started a job without benefits, no longer with "ex fiance", "did not know my fiance was cheating" and recently "returning to town this weekend" Pt voiced understanding of all resources and services offered  Denies any changes in abdominal pain or pain/medical status     Action/Plan:   Cm spoke with pt in [facility] ED rm #9 about self pay pcps, need for ob gyn, [facility] $4 generic list, needymeds.[email or web address], discounted pharmacies, [place] std clinic/services and affordable care act referral to [facility] liaison made   Action/Plan Detail:   Provided STD counseling (encouraged partner to be treated prior to further encounters)  Cm spoke with ED PA about pt concern about getting to work by "three o'clock", denial of further symptoms, pain, etc.  Pt updated   Anticipated DC Date:  03/15/2013     Status Recommendation to Physician:   Result of Recommendation:    Other ED Services  Consult Working Plan    DC Planning Services  PCP issues  Other  [facility] / [facility] (established/new)  Outpatient Services - Pt will follow up  Medication Assistance    Choice offered to / List presented to:            Status of service:  Completed, signed off  ED Comments:   ED Comments Detail:  Cm reviewed chs match program and informed her that Cm would provide MATCH letter if determined it is needed by ED PA. PA states pt to be given one time  zitrhomax injection and not further medications will be needed

## 2013-03-19 NOTE — ED Provider Notes (Signed)
Medical screening examination/treatment/procedure(s) were performed by non-physician practitioner and as supervising physician I was immediately available for consultation/collaboration.   Rosabelle Jupin L Jonell Krontz, MD 03/19/13 2005 

## 2013-04-27 ENCOUNTER — Encounter (HOSPITAL_COMMUNITY): Payer: Self-pay | Admitting: Emergency Medicine

## 2013-04-27 ENCOUNTER — Emergency Department (HOSPITAL_COMMUNITY)
Admission: EM | Admit: 2013-04-27 | Discharge: 2013-04-27 | Disposition: A | Discharge status: Home / Self Care | Payer: No Typology Code available for payment source | Attending: Emergency Medicine | Admitting: Emergency Medicine

## 2013-04-27 DIAGNOSIS — Z79899 Other long term (current) drug therapy: Secondary | ICD-10-CM | POA: Insufficient documentation

## 2013-04-27 DIAGNOSIS — K921 Melena: Secondary | ICD-10-CM | POA: Insufficient documentation

## 2013-04-27 DIAGNOSIS — J45909 Unspecified asthma, uncomplicated: Secondary | ICD-10-CM | POA: Insufficient documentation

## 2013-04-27 DIAGNOSIS — Z880 Allergy status to penicillin: Secondary | ICD-10-CM | POA: Insufficient documentation

## 2013-04-27 DIAGNOSIS — K219 Gastro-esophageal reflux disease without esophagitis: Secondary | ICD-10-CM | POA: Insufficient documentation

## 2013-04-27 DIAGNOSIS — Z862 Personal history of diseases of the blood and blood-forming organs and certain disorders involving the immune mechanism: Secondary | ICD-10-CM | POA: Insufficient documentation

## 2013-04-27 DIAGNOSIS — F17200 Nicotine dependence, unspecified, uncomplicated: Secondary | ICD-10-CM | POA: Insufficient documentation

## 2013-04-27 DIAGNOSIS — Z8639 Personal history of other endocrine, nutritional and metabolic disease: Secondary | ICD-10-CM | POA: Insufficient documentation

## 2013-04-27 DIAGNOSIS — R1013 Epigastric pain: Secondary | ICD-10-CM | POA: Insufficient documentation

## 2013-04-27 LAB — COMPREHENSIVE METABOLIC PANEL
CO2: 24 mEq/L (ref 19–32)
Calcium: 9 mg/dL (ref 8.4–10.5)
Creatinine, Ser: 0.63 mg/dL (ref 0.50–1.10)
GFR calc Af Amer: >90 mL/min (ref >90)
GFR calc non Af Amer: >90 mL/min (ref >90)
Glucose, Bld: 85 mg/dL (ref 70–99)

## 2013-04-27 LAB — CBC WITH DIFFERENTIAL/PLATELET
Basophils Absolute: 0 10*3/uL (ref 0.0–0.1)
Eosinophils Relative: 3 % (ref 0–5)
HCT: 39.3 % (ref 36.0–46.0)
Lymphocytes Relative: 27 % (ref 12–46)
Lymphs Abs: 1.9 10*3/uL (ref 0.7–4.0)
MCV: 82.4 fL (ref 78.0–100.0)
Monocytes Absolute: 0.7 10*3/uL (ref 0.1–1.0)
RDW: 15.3 % (ref 11.5–15.5)
WBC: 6.9 10*3/uL (ref 4.0–10.5)

## 2013-04-27 LAB — OCCULT BLOOD, POC DEVICE: Fecal Occult Bld: NEGATIVE

## 2013-04-27 MED ORDER — SUCRALFATE 1 G PO TABS: 1.0000 g | ORAL_TABLET | Freq: Three times a day (TID) | ORAL | Status: DC

## 2013-04-27 MED ORDER — OMEPRAZOLE 20 MG PO CPDR: DELAYED_RELEASE_CAPSULE | ORAL | Status: DC

## 2013-04-27 MED ORDER — GI COCKTAIL ~~LOC~~
30.0000 mL | Freq: Once | ORAL | Status: AC
  Administered 2013-04-27: 30 mL via ORAL
  Filled 2013-04-27: qty 30

## 2013-04-27 NOTE — ED Notes (Signed)
Bed: WA03 Expected date:  Expected time:  Means of arrival:  Comments: 

## 2013-04-27 NOTE — Progress Notes (Signed)
[facility] provided patient with a list of primary care resources, a [facility] [employer], and information on [employer].

## 2013-04-27 NOTE — ED Provider Notes (Signed)
  Medical screening examination/treatment/procedure(s) were performed by non-physician practitioner and as supervising physician I was immediately available for consultation/collaboration.  EKG Interpretation   None          Gerhard Munch, MD 04/27/13 1624

## 2013-04-27 NOTE — ED Provider Notes (Signed)
CSN: [id number]     Arrival date & time 04/27/13  7253 History   First MD Initiated Contact with Patient 04/27/13 1046     Chief Complaint  Patient presents with  . Rectal Bleeding   (Consider location/radiation/quality/duration/timing/severity/associated sxs/prior Treatment) HPI Comments: Patient with h/o PCOS, GERD -- presents with c/o worse epigastric pain c/w GERD and black stools for past one week. Pain radiates to sides, not to back. No N/V/D, urinary sx. Patient has been taking pepcid everyday for pain. She was taking pepto-bismol (bought after black stools began) stopping yesterday. Not on PPI. The onset of this condition was gradual. The course is constant. Aggravating factors: certain foods. Alleviating factors: none.    Patient is a 22 y.o. female presenting with hematochezia. The history is provided by the patient.  Rectal Bleeding Associated symptoms: abdominal pain   Associated symptoms: no fever and no vomiting     Past Medical History  Diagnosis Date  . Asthma   . Polycystic ovarian disease   . GERD (gastroesophageal reflux disease)    Past Surgical History  Procedure Laterality Date  . Tonsillectomy     Family History  Problem Relation Age of Onset  . Hypertension Mother    History  Substance Use Topics  . Smoking status: Current Every Day Smoker -- 1.00 packs/day    Types: Cigarettes  . Smokeless tobacco: Never Used  . Alcohol Use: Yes     Comment: rarely   OB History   Grav Para Term Preterm Abortions TAB SAB Ect Mult Living                 Review of Systems  Constitutional: Negative for fever.  HENT: Negative for rhinorrhea and sore throat.   Eyes: Negative for redness.  Respiratory: Negative for cough.   Cardiovascular: Negative for chest pain.  Gastrointestinal: Positive for abdominal pain, blood in stool and hematochezia. Negative for nausea, vomiting and diarrhea.  Genitourinary: Negative for dysuria.  Musculoskeletal: Negative for myalgias.   Skin: Negative for rash.  Neurological: Negative for headaches.    Allergies  Penicillins; Robitussin; and Shrimp  Home Medications   Current Outpatient Rx  Name  Route  Sig  Dispense  Refill  . Aspirin-Salicylamide-Caffeine (BC HEADACHE POWDER PO)   Oral   Take 1 packet by mouth daily as needed (Headache). For headache         . famotidine (PEPCID) 10 MG tablet   Oral   Take 10 mg by mouth 2 (two) times daily.         Marland Kitchen albuterol (PROVENTIL HFA;VENTOLIN HFA) 108 (90 BASE) MCG/ACT inhaler   Inhalation   Inhale 2 puffs into the lungs every 6 (six) hours as needed for wheezing or shortness of breath.          BP 106/93  Pulse 75  Temp(Src) 98.6 F (37 C) (Oral)  Resp 20  SpO2 99%  LMP 03/01/2013 Physical Exam  Nursing note and vitals reviewed. Constitutional: She appears well-developed and well-nourished.  HENT:  Head: Normocephalic and atraumatic.  Eyes: Conjunctivae are normal. Right eye exhibits no discharge. Left eye exhibits no discharge.  Neck: Normal range of motion. Neck supple.  Cardiovascular: Normal rate, regular rhythm and normal heart sounds.   No murmur heard. Pulmonary/Chest: Effort normal and breath sounds normal. No respiratory distress. She has no wheezes. She has no rales.  Abdominal: Soft. Bowel sounds are normal. There is tenderness in the epigastric area. There is no CVA tenderness.  Neurological: She is alert.  Skin: Skin is warm and dry.  Psychiatric: She has a normal mood and affect.    ED Course  Procedures (including critical care time) Labs Review Labs Reviewed  COMPREHENSIVE METABOLIC PANEL - Abnormal; Notable for the following:    Albumin 3.4 (*)    Total Bilirubin 0.2 (*)    All other components within normal limits  CBC WITH DIFFERENTIAL  OCCULT BLOOD, POC DEVICE   Imaging Review No results found.  EKG Interpretation   None      11:04 AM Patient seen and examined. Work-up initiated. Medications ordered.    Vital signs reviewed and are as follows: Filed Vitals:   04/27/13 0925  BP: 106/93  Pulse: 75  Temp: 98.6 F (37 C)  Resp: 20   1:20 PM Stool heme neg. No anemia. Pt appears well. She has received PCP referrals. She will start on PPI, carafate, continue H2 blocker if desired. Exam unchanged.   The patient was urged to return to the Emergency Department immediately with worsening of current symptoms, worsening abdominal pain, persistent vomiting, blood noted in stools, fever, or any other concerns. The patient verbalized understanding.    MDM   1. Epigastric pain    Patient with epigastric pain likely 2/2 GERD or PUD. Black stools might be digested blood, but Pepto Bismol digestion is also a possibility. Regardless she is having pain and will maximize therapy by starting PPI/carafate. She is encouraged to f/u with PCP, return with worsening. She appears well. Will continue to avoid bothersome foods as well.     Renne Crigler, PA-C 04/27/13 1323

## 2013-04-27 NOTE — ED Notes (Signed)
Pt c/o abd pain and black stool x 1 week. Has hx of GERD.

## 2013-07-16 ENCOUNTER — Encounter (HOSPITAL_COMMUNITY): Payer: Self-pay | Admitting: Emergency Medicine

## 2013-07-16 ENCOUNTER — Emergency Department (HOSPITAL_COMMUNITY)
Admission: EM | Admit: 2013-07-16 | Discharge: 2013-07-16 | Disposition: A | Discharge status: Home / Self Care | Payer: No Typology Code available for payment source | Attending: Emergency Medicine | Admitting: Emergency Medicine

## 2013-07-16 DIAGNOSIS — K219 Gastro-esophageal reflux disease without esophagitis: Secondary | ICD-10-CM | POA: Insufficient documentation

## 2013-07-16 DIAGNOSIS — Z79899 Other long term (current) drug therapy: Secondary | ICD-10-CM | POA: Insufficient documentation

## 2013-07-16 DIAGNOSIS — L509 Urticaria, unspecified: Secondary | ICD-10-CM

## 2013-07-16 DIAGNOSIS — Z880 Allergy status to penicillin: Secondary | ICD-10-CM | POA: Insufficient documentation

## 2013-07-16 DIAGNOSIS — J45909 Unspecified asthma, uncomplicated: Secondary | ICD-10-CM | POA: Insufficient documentation

## 2013-07-16 DIAGNOSIS — F17200 Nicotine dependence, unspecified, uncomplicated: Secondary | ICD-10-CM | POA: Insufficient documentation

## 2013-07-16 DIAGNOSIS — Z8742 Personal history of other diseases of the female genital tract: Secondary | ICD-10-CM | POA: Insufficient documentation

## 2013-07-16 MED ORDER — PREDNISONE 20 MG PO TABS
60.0000 mg | ORAL_TABLET | Freq: Once | ORAL | Status: AC
  Administered 2013-07-16: 60 mg via ORAL
  Filled 2013-07-16: qty 3

## 2013-07-16 MED ORDER — EPINEPHRINE HCL 1 MG/ML IJ SOLN
0.2000 mg | Freq: Once | INTRAMUSCULAR | Status: DC
  Filled 2013-07-16: qty 1

## 2013-07-16 MED ORDER — PREDNISONE 10 MG PO TABS: 20.0000 mg | ORAL_TABLET | Freq: Every day | ORAL | Status: DC

## 2013-07-16 NOTE — Discharge Instructions (Signed)
If your hives persist after you complete the prednisone you should see an allergist for further testing. Re check if you feel swelling in your mouth/throat, or any difficulty breathing.  Hives Hives are itchy, red, puffy (swollen) areas of the skin. Hives can change in size and location on your body. Hives can come and go for hours, days, or weeks. Hives do not spread from person to person (noncontagious). Scratching, exercise, and stress can make your hives worse. HOME CARE  Avoid things that cause your hives (triggers).  Take antihistamine medicines as told by your doctor. Do not drive while taking an antihistamine.  Take any other medicines for itching as told by your doctor.  Wear loose-fitting clothing.  Keep all doctor visits as told. GET HELP RIGHT AWAY IF:   You have a fever.  Your tongue or lips are puffy.  You have trouble breathing or swallowing.  You feel tightness in the throat or chest.  You have belly (abdominal) pain.  You have lasting or severe itching that is not helped by medicine.  You have painful or puffy joints. These problems may be the first sign of a life-threatening allergic reaction. Call your local emergency services (911 in [place] MAKE SURE YOU:   Understand these instructions.  Will watch your condition.  Will get help right away if you are not doing well or get worse. Document Released: 02/20/2008 Document Revised: 11/12/2011 Document Reviewed: 08/06/2011 [facility] Patient Information 2014 [employer] [place], [place].

## 2013-07-16 NOTE — ED Notes (Signed)
Pt states she has a rash that has been going on for about a week  Pt states it is worse when she puts clothes on she has just taken out of the dryer  Pt states she has not changed detergents or dryer sheets  Pt states she has been using clariton and benadryl for it without improvement  Pt states it itches

## 2013-07-16 NOTE — ED Provider Notes (Signed)
CSN: [id number]     Arrival date & time 07/16/13  0654 History   First MD Initiated Contact with Patient 07/16/13 0701     Chief Complaint  Patient presents with  . Urticaria      HPI  Neurological resubmission is elevated red patches on her serum of her pleuritic ribs noted foods, no new skin products no new chemicals. No change in job. She does [employer]. Lives in her own home. No difficulty breathing or oral swelling.   Past Medical History  Diagnosis Date  . Asthma   . Polycystic ovarian disease   . GERD (gastroesophageal reflux disease)    Past Surgical History  Procedure Laterality Date  . Tonsillectomy     Family History  Problem Relation Age of Onset  . Hypertension Mother    History  Substance Use Topics  . Smoking status: Current Every Day Smoker -- 1.00 packs/day    Types: Cigarettes  . Smokeless tobacco: Never Used  . Alcohol Use: Yes     Comment: rarely   OB History   Grav Para Term Preterm Abortions TAB SAB Ect Mult Living                 Review of Systems  Constitutional: Negative for fever, chills, diaphoresis, appetite change and fatigue.  HENT: Negative for mouth sores, sore throat and trouble swallowing.   Eyes: Negative for visual disturbance.  Respiratory: Negative for cough, chest tightness, shortness of breath and wheezing.   Cardiovascular: Negative for chest pain.  Gastrointestinal: Negative for nausea, vomiting, abdominal pain, diarrhea and abdominal distention.  Endocrine: Negative for polydipsia, polyphagia and polyuria.  Genitourinary: Negative for dysuria, frequency and hematuria.  Musculoskeletal: Negative for gait problem.  Skin: Positive for rash. Negative for color change and pallor.  Neurological: Negative for dizziness, syncope, light-headedness and headaches.  Hematological: Does not bruise/bleed easily.  Psychiatric/Behavioral: Negative for behavioral problems and confusion.      Allergies  Penicillins; Robitussin;  and Shrimp  Home Medications   Current Outpatient Rx  Name  Route  Sig  Dispense  Refill  . albuterol (PROVENTIL HFA;VENTOLIN HFA) 108 (90 BASE) MCG/ACT inhaler   Inhalation   Inhale 2 puffs into the lungs every 6 (six) hours as needed for wheezing or shortness of breath.         . Aspirin-Salicylamide-Caffeine (BC HEADACHE POWDER PO)   Oral   Take 1 packet by mouth daily as needed (Headache). For headache         . famotidine (PEPCID) 10 MG tablet   Oral   Take 10 mg by mouth 2 (two) times daily.         . predniSONE (DELTASONE) 10 MG tablet   Oral   Take 2 tablets (20 mg total) by mouth daily.   10 tablet   0   . sucralfate (CARAFATE) 1 G tablet   Oral   Take 1 tablet (1 g total) by mouth 4 (four) times daily -  with meals and at bedtime.   30 tablet   0    BP 126/81  Pulse 112  Temp(Src) 98.7 F (37.1 C) (Oral)  Resp 16  Ht 5\' 5"  (1.651 m)  Wt 276 lb (125.193 kg)  BMI 45.93 kg/m2  SpO2 99%  LMP 06/27/2013 Physical Exam  Constitutional: She is oriented to person, place, and time. She appears well-developed and well-nourished. No distress.  Frequently scratching at her RUE urticaria. No acute distress.  HENT:  Head: Normocephalic.  No intraoral swelling  Eyes: Conjunctivae are normal. Pupils are equal, round, and reactive to light. No scleral icterus.  Neck: Normal range of motion. Neck supple. No thyromegaly present.  Cardiovascular: Normal rate and regular rhythm.  Exam reveals no gallop and no friction rub.   No murmur heard. Pulmonary/Chest: Effort normal and breath sounds normal. No respiratory distress. She has no wheezes. She has no rales.  Clear lungs  Abdominal: Soft. Bowel sounds are normal. She exhibits no distension. There is no tenderness. There is no rebound.  Musculoskeletal: Normal range of motion.  Neurological: She is alert and oriented to person, place, and time.  Skin: Skin is warm and dry. No rash noted.     Psychiatric: She has a  normal mood and affect. Her behavior is normal.    ED Course  Procedures (including critical care time) Labs Review Labs Reviewed - No data to display Imaging Review No results found.  EKG Interpretation   None       MDM   Final diagnoses:  Urticaria    Localized urticaria without lung or skin involvement. Plan is steroids, anti-histamine, allergy followup if not improving. Recheck in the emergency room with any difficulty breathing or sensation of swelling in the mouth or throat.     Rolland Porter, MD 07/16/13 [phone]

## 2013-09-25 ENCOUNTER — Emergency Department (HOSPITAL_COMMUNITY)
Admission: EM | Admit: 2013-09-25 | Discharge: 2013-09-25 | Disposition: A | Discharge status: Home / Self Care | Payer: No Typology Code available for payment source | Attending: Emergency Medicine | Admitting: Emergency Medicine

## 2013-09-25 ENCOUNTER — Encounter (HOSPITAL_COMMUNITY): Payer: Self-pay | Admitting: Emergency Medicine

## 2013-09-25 DIAGNOSIS — IMO0002 Reserved for concepts with insufficient information to code with codable children: Secondary | ICD-10-CM | POA: Insufficient documentation

## 2013-09-25 DIAGNOSIS — F17200 Nicotine dependence, unspecified, uncomplicated: Secondary | ICD-10-CM | POA: Insufficient documentation

## 2013-09-25 DIAGNOSIS — Z79899 Other long term (current) drug therapy: Secondary | ICD-10-CM | POA: Insufficient documentation

## 2013-09-25 DIAGNOSIS — N898 Other specified noninflammatory disorders of vagina: Secondary | ICD-10-CM

## 2013-09-25 DIAGNOSIS — Z880 Allergy status to penicillin: Secondary | ICD-10-CM | POA: Insufficient documentation

## 2013-09-25 DIAGNOSIS — Z8619 Personal history of other infectious and parasitic diseases: Secondary | ICD-10-CM | POA: Insufficient documentation

## 2013-09-25 DIAGNOSIS — E663 Overweight: Secondary | ICD-10-CM | POA: Insufficient documentation

## 2013-09-25 DIAGNOSIS — J45909 Unspecified asthma, uncomplicated: Secondary | ICD-10-CM | POA: Insufficient documentation

## 2013-09-25 DIAGNOSIS — Z3202 Encounter for pregnancy test, result negative: Secondary | ICD-10-CM | POA: Insufficient documentation

## 2013-09-25 DIAGNOSIS — N390 Urinary tract infection, site not specified: Secondary | ICD-10-CM | POA: Insufficient documentation

## 2013-09-25 LAB — URINALYSIS, ROUTINE W REFLEX MICROSCOPIC
Bilirubin Urine: NEGATIVE
GLUCOSE, UA: NEGATIVE mg/dL
Hgb urine dipstick: NEGATIVE
KETONES UR: NEGATIVE mg/dL
Nitrite: NEGATIVE
PROTEIN: NEGATIVE mg/dL
Specific Gravity, Urine: 1.022 (ref 1.005–1.030)
UROBILINOGEN UA: 0.2 mg/dL (ref 0.0–1.0)
pH: 6 (ref 5.0–8.0)

## 2013-09-25 LAB — CBC WITH DIFFERENTIAL/PLATELET
BASOS PCT: 1 % (ref 0–1)
Basophils Absolute: 0.1 10*3/uL (ref 0.0–0.1)
EOS ABS: 0.3 10*3/uL (ref 0.0–0.7)
Eosinophils Relative: 3 % (ref 0–5)
HEMATOCRIT: 38.8 % (ref 36.0–46.0)
HEMOGLOBIN: 12.6 g/dL (ref 12.0–15.0)
Lymphocytes Relative: 32 % (ref 12–46)
Lymphs Abs: 3.2 10*3/uL (ref 0.7–4.0)
MCH: 27.5 pg (ref 26.0–34.0)
MCHC: 32.5 g/dL (ref 30.0–36.0)
MCV: 84.5 fL (ref 78.0–100.0)
MONO ABS: 1.1 10*3/uL — AB (ref 0.1–1.0)
Monocytes Relative: 11 % (ref 3–12)
NEUTROS PCT: 54 % (ref 43–77)
Neutro Abs: 5.2 10*3/uL (ref 1.7–7.7)
Platelets: 330 10*3/uL (ref 150–400)
RBC: 4.59 MIL/uL (ref 3.87–5.11)
RDW: 15.1 % (ref 11.5–15.5)
WBC: 9.8 10*3/uL (ref 4.0–10.5)

## 2013-09-25 LAB — BASIC METABOLIC PANEL
BUN: 16 mg/dL (ref 6–23)
CALCIUM: 9 mg/dL (ref 8.4–10.5)
CO2: 23 mEq/L (ref 19–32)
CREATININE: 0.64 mg/dL (ref 0.50–1.10)
Chloride: 103 mEq/L (ref 96–112)
Glucose, Bld: 87 mg/dL (ref 70–99)
Potassium: 4.1 mEq/L (ref 3.7–5.3)
Sodium: 140 mEq/L (ref 137–147)

## 2013-09-25 LAB — WET PREP, GENITAL
Clue Cells Wet Prep HPF POC: NONE SEEN
Trich, Wet Prep: NONE SEEN
Yeast Wet Prep HPF POC: NONE SEEN

## 2013-09-25 LAB — URINE MICROSCOPIC-ADD ON

## 2013-09-25 LAB — PREGNANCY, URINE: Preg Test, Ur: NEGATIVE

## 2013-09-25 MED ORDER — CIPROFLOXACIN HCL 500 MG PO TABS: 500.0000 mg | ORAL_TABLET | Freq: Two times a day (BID) | ORAL | Status: DC

## 2013-09-25 MED ORDER — AZITHROMYCIN 1 G PO PACK
1.0000 g | PACK | Freq: Once | ORAL | Status: DC
  Filled 2013-09-25: qty 1

## 2013-09-25 MED ORDER — CIPROFLOXACIN HCL 500 MG PO TABS
500.0000 mg | ORAL_TABLET | Freq: Once | ORAL | Status: AC
  Administered 2013-09-25: 500 mg via ORAL
  Filled 2013-09-25: qty 1

## 2013-09-25 MED ORDER — CEFTRIAXONE SODIUM 250 MG IJ SOLR: 250.0000 mg | Freq: Once | INTRAMUSCULAR | Status: DC

## 2013-09-25 MED ORDER — SODIUM CHLORIDE 0.9 % IV BOLUS (SEPSIS): 1000.0000 mL | Freq: Once | INTRAVENOUS | Status: DC

## 2013-09-25 MED ORDER — AZITHROMYCIN 600 MG PO TABS
1200.0000 mg | ORAL_TABLET | Freq: Once | ORAL | Status: AC
  Administered 2013-09-25: 1200 mg via ORAL
  Filled 2013-09-25: qty 2

## 2013-09-25 MED ORDER — ACETAMINOPHEN 500 MG PO TABS: 1000.0000 mg | ORAL_TABLET | Freq: Once | ORAL | Status: DC

## 2013-09-25 NOTE — Discharge Instructions (Signed)
Take cipro twice a day for 5 days.   Follow up with your doctor.   You will be called if your GC or chlamydia is positive then your partner will need to be treated.   Consider getting on oral contraceptives if you have a lot of bleeding.   Return to ER if you have worse bleeding, vaginal discharge, severe pain, vomiting.

## 2013-09-25 NOTE — ED Notes (Signed)
Pt came in w/ sister who is also a pt.  C/o low abd pain.  Has been on and off her period x 2-3 wks.  States it does not burn or itch.  Having yellow vaginal discharge x 3 wks.  Denies vomiting or diarrhea.

## 2013-09-25 NOTE — ED Notes (Signed)
Discharge vitals were not done because pt refused vitals saying she had to get her sister home.

## 2013-09-25 NOTE — ED Notes (Signed)
Pt states she has had lower abdominal pain and vaginal discharge x2 weeks.  Pt was on her period for past week.  Pt states today she noticed that the discharge was yellow today.  Pt states discharge is not malodorous or pruritic.  Pt states she had sex 3 weeks ago, but they used a condom.  She is concerned about an STD.  Pt denies blisters or warts in genital area.  Pt denies N/V/D and fever.

## 2013-09-25 NOTE — ED Provider Notes (Addendum)
CSN: [id number]     Arrival date & time 09/25/13  1559 History   First MD Initiated Contact with Patient 09/25/13 1638     Chief Complaint  Patient presents with  . Abdominal Pain     (Consider location/radiation/quality/duration/timing/severity/associated sxs/prior Treatment) The history is provided by the patient.  Tasha Hughes is a 23 y.o. female hx of asthma, PCOS here with lower abdominal pain. Lower abdominal pain for the last 2-3 weeks. She has PCOS and had her period for the last 2 weeks. Her period stopped yesterday. She noticed some yellowish vaginal discharge since yesterday. Denies any nausea or vomiting or diarrhea. She is sexually active with one female partner and has been using condoms. She was diagnosed with Trichomonas and Chlamydia previously but states that her female partner didn't have either and she denies any other sexual partners.    Past Medical History  Diagnosis Date  . Asthma   . Polycystic ovarian disease   . GERD (gastroesophageal reflux disease)    Past Surgical History  Procedure Laterality Date  . Tonsillectomy     Family History  Problem Relation Age of Onset  . Hypertension Mother    History  Substance Use Topics  . Smoking status: Current Every Day Smoker -- 1.00 packs/day    Types: Cigarettes  . Smokeless tobacco: Never Used  . Alcohol Use: Yes     Comment: rarely   OB History   Grav Para Term Preterm Abortions TAB SAB Ect Mult Living                 Review of Systems  Gastrointestinal: Positive for abdominal pain.  Genitourinary: Positive for vaginal discharge.  All other systems reviewed and are negative.     Allergies  Penicillins; Robitussin; and Shrimp  Home Medications   Prior to Admission medications   Medication Sig Start Date End Date Taking? Authorizing Provider  albuterol (PROVENTIL HFA;VENTOLIN HFA) 108 (90 BASE) MCG/ACT inhaler Inhale 2 puffs into the lungs every 6 (six) hours as needed for wheezing or shortness  of breath.    Historical Provider, MD  Aspirin-Salicylamide-Caffeine (BC HEADACHE POWDER PO) Take 1 packet by mouth daily as needed (Headache). For headache    Historical Provider, MD  famotidine (PEPCID) 10 MG tablet Take 10 mg by mouth 2 (two) times daily.    Historical Provider, MD  predniSONE (DELTASONE) 10 MG tablet Take 2 tablets (20 mg total) by mouth daily. 07/16/13   Rolland PorterMark James, MD  sucralfate (CARAFATE) 1 G tablet Take 1 tablet (1 g total) by mouth 4 (four) times daily -  with meals and at bedtime. 04/27/13   Renne CriglerJoshua Geiple, PA-C   BP 125/70  Pulse 109  Temp(Src) 98.6 F (37 C) (Oral)  Resp 14  SpO2 97%  LMP 09/08/2013 Physical Exam  Nursing note and vitals reviewed. Constitutional: She is oriented to person, place, and time. She appears well-nourished.  Overweight, NAD   HENT:  Head: Normocephalic.  Mouth/Throat: Oropharynx is clear and moist.  Eyes: Conjunctivae are normal. Pupils are equal, round, and reactive to light.  Neck: Normal range of motion.  Cardiovascular: Normal rate, regular rhythm and normal heart sounds.   Pulmonary/Chest: Effort normal and breath sounds normal. No respiratory distress. She has no wheezes. She has no rales.  Abdominal: Soft. Bowel sounds are normal. She exhibits no distension. There is no tenderness. There is no rebound and no guarding.  Genitourinary:  Whitish discharge, no CMT or adnexal tenderness   Musculoskeletal:  Normal range of motion. She exhibits no edema and no tenderness.  Neurological: She is alert and oriented to person, place, and time. No cranial nerve deficit. Coordination normal.  Skin: Skin is warm and dry.  Psychiatric: She has a normal mood and affect. Her behavior is normal. Judgment and thought content normal.    ED Course  Procedures (including critical care time) Labs Review Labs Reviewed  WET PREP, GENITAL - Abnormal; Notable for the following:    WBC, Wet Prep HPF POC FEW (*)    All other components within  normal limits  URINALYSIS, ROUTINE W REFLEX MICROSCOPIC - Abnormal; Notable for the following:    Leukocytes, UA MODERATE (*)    All other components within normal limits  CBC WITH DIFFERENTIAL - Abnormal; Notable for the following:    Monocytes Absolute 1.1 (*)    All other components within normal limits  GC/CHLAMYDIA PROBE AMP  PREGNANCY, URINE  BASIC METABOLIC PANEL  URINE MICROSCOPIC-ADD ON    Imaging Review No results found.   EKG Interpretation None      MDM   Final diagnoses:  None   Tasha Hughes is a 23 y.o. female here with lower abdominal pain, vaginal discharge. Will do pelvic exam. She has been having vag bleed for 2 weeks, will check CBC. Of note, temp 102 was documented on wrong patient, repeat temp was 98 without being given tylenol.   6:17 PM  Pelvic exam showed whitish discharge. Wet prep nl. UA + UTI. Given cipro. Had chlamydia previously so wants empiric treatment. Given zithromax. Has anaphylaxis to PCN so will hold off on giving empiric treatment for GC.   Richardean Canalavid H Forrestine Lecrone, MD 09/25/13 1819  Richardean Canalavid H Elner Seifert, MD 09/25/13 [id number] [id number]

## 2013-09-27 LAB — GC/CHLAMYDIA PROBE AMP
CT Probe RNA: NEGATIVE
GC Probe RNA: POSITIVE — AB

## 2013-09-29 ENCOUNTER — Telehealth (HOSPITAL_BASED_OUTPATIENT_CLINIC_OR_DEPARTMENT_OTHER): Payer: Self-pay | Admitting: Emergency Medicine

## 2013-09-29 NOTE — Telephone Encounter (Signed)
+  Gonorrhea. Patient treated with Rocephin and Zithromax. [employer] faxed. 

## 2013-10-04 ENCOUNTER — Telehealth (HOSPITAL_BASED_OUTPATIENT_CLINIC_OR_DEPARTMENT_OTHER): Payer: Self-pay | Admitting: Emergency Medicine

## 2013-12-18 ENCOUNTER — Emergency Department (HOSPITAL_COMMUNITY)
Admission: EM | Admit: 2013-12-18 | Discharge: 2013-12-18 | Discharge status: Against Medical Advice | Payer: No Typology Code available for payment source | Attending: Emergency Medicine | Admitting: Emergency Medicine

## 2013-12-18 ENCOUNTER — Encounter (HOSPITAL_COMMUNITY): Payer: Self-pay | Admitting: Emergency Medicine

## 2013-12-18 DIAGNOSIS — F17200 Nicotine dependence, unspecified, uncomplicated: Secondary | ICD-10-CM | POA: Insufficient documentation

## 2013-12-18 DIAGNOSIS — L293 Anogenital pruritus, unspecified: Secondary | ICD-10-CM | POA: Insufficient documentation

## 2013-12-18 DIAGNOSIS — N898 Other specified noninflammatory disorders of vagina: Secondary | ICD-10-CM | POA: Insufficient documentation

## 2013-12-18 DIAGNOSIS — J45909 Unspecified asthma, uncomplicated: Secondary | ICD-10-CM | POA: Insufficient documentation

## 2013-12-18 NOTE — ED Notes (Signed)
Pt left without being seen by Dr.

## 2013-12-18 NOTE — ED Notes (Signed)
Patient paged x3.

## 2013-12-18 NOTE — ED Notes (Signed)
Pt. Stated, I've had a discharge from my vagina. Im having itching and burning from my vagina for a couple of days.

## 2013-12-20 ENCOUNTER — Emergency Department (HOSPITAL_COMMUNITY)
Admission: EM | Admit: 2013-12-20 | Discharge: 2013-12-20 | Discharge status: Against Medical Advice | Payer: No Typology Code available for payment source | Attending: Emergency Medicine | Admitting: Emergency Medicine

## 2013-12-20 ENCOUNTER — Encounter (HOSPITAL_COMMUNITY): Payer: Self-pay | Admitting: Emergency Medicine

## 2013-12-20 DIAGNOSIS — N898 Other specified noninflammatory disorders of vagina: Secondary | ICD-10-CM | POA: Insufficient documentation

## 2013-12-20 DIAGNOSIS — N390 Urinary tract infection, site not specified: Secondary | ICD-10-CM | POA: Insufficient documentation

## 2013-12-20 NOTE — ED Notes (Signed)
Patient c/o painful/itchy feeling with urination, frequent small amounts of urination, also c/o back pain with these symptoms. Patient also c/o clear-white vaginal discharge that onset @2  days ago. Patient states she has a hx of Gonorrhea and chlamydia in may or June of this year. Patient states she completed her abx at that time. [facility] but left AMA.

## 2013-12-20 NOTE — ED Notes (Signed)
Patient didnot answer when called for blood draw.RN made aware 

## 2013-12-20 NOTE — ED Notes (Signed)
Pt called 3+ times for a room. No answer.

## 2014-05-03 ENCOUNTER — Emergency Department (HOSPITAL_COMMUNITY)
Admission: EM | Admit: 2014-05-03 | Discharge: 2014-05-03 | Disposition: A | Discharge status: Home / Self Care | Payer: Self-pay | Attending: Emergency Medicine | Admitting: Emergency Medicine

## 2014-05-03 ENCOUNTER — Emergency Department (HOSPITAL_COMMUNITY): Payer: Self-pay

## 2014-05-03 DIAGNOSIS — Z8719 Personal history of other diseases of the digestive system: Secondary | ICD-10-CM | POA: Insufficient documentation

## 2014-05-03 DIAGNOSIS — Z720 Tobacco use: Secondary | ICD-10-CM | POA: Insufficient documentation

## 2014-05-03 DIAGNOSIS — R102 Pelvic and perineal pain: Secondary | ICD-10-CM | POA: Insufficient documentation

## 2014-05-03 DIAGNOSIS — A599 Trichomoniasis, unspecified: Secondary | ICD-10-CM

## 2014-05-03 DIAGNOSIS — Z3202 Encounter for pregnancy test, result negative: Secondary | ICD-10-CM | POA: Insufficient documentation

## 2014-05-03 DIAGNOSIS — N83201 Unspecified ovarian cyst, right side: Secondary | ICD-10-CM

## 2014-05-03 DIAGNOSIS — Z7982 Long term (current) use of aspirin: Secondary | ICD-10-CM | POA: Insufficient documentation

## 2014-05-03 DIAGNOSIS — N830 Follicular cyst of ovary: Secondary | ICD-10-CM | POA: Insufficient documentation

## 2014-05-03 DIAGNOSIS — Z8639 Personal history of other endocrine, nutritional and metabolic disease: Secondary | ICD-10-CM | POA: Insufficient documentation

## 2014-05-03 DIAGNOSIS — J45901 Unspecified asthma with (acute) exacerbation: Secondary | ICD-10-CM | POA: Insufficient documentation

## 2014-05-03 DIAGNOSIS — A5901 Trichomonal vulvovaginitis: Secondary | ICD-10-CM | POA: Insufficient documentation

## 2014-05-03 DIAGNOSIS — Z880 Allergy status to penicillin: Secondary | ICD-10-CM | POA: Insufficient documentation

## 2014-05-03 LAB — I-STAT CHEM 8, ED
BUN: 14 mg/dL (ref 6–23)
CALCIUM ION: 1.2 mmol/L (ref 1.12–1.23)
Chloride: 105 mEq/L (ref 96–112)
Creatinine, Ser: 0.5 mg/dL (ref 0.50–1.10)
GLUCOSE: 103 mg/dL — AB (ref 70–99)
HCT: 41 % (ref 36.0–46.0)
Hemoglobin: 13.9 g/dL (ref 12.0–15.0)
Potassium: 3.9 mEq/L (ref 3.7–5.3)
Sodium: 140 mEq/L (ref 137–147)
TCO2: 22 mmol/L (ref 0–100)

## 2014-05-03 LAB — COMPREHENSIVE METABOLIC PANEL
ALT: 22 U/L (ref 0–35)
AST: 18 U/L (ref 0–37)
Albumin: 3.5 g/dL (ref 3.5–5.2)
Alkaline Phosphatase: 90 U/L (ref 39–117)
Anion gap: 27 — ABNORMAL HIGH (ref 5–15)
BUN: 14 mg/dL (ref 6–23)
CO2: 25 meq/L (ref 19–32)
Calcium: 9.4 mg/dL (ref 8.4–10.5)
Chloride: 95 mEq/L — ABNORMAL LOW (ref 96–112)
Creatinine, Ser: 0.62 mg/dL (ref 0.50–1.10)
GFR calc Af Amer: >90 mL/min (ref >90)
GLUCOSE: 87 mg/dL (ref 70–99)
POTASSIUM: 3.9 meq/L (ref 3.7–5.3)
SODIUM: 147 meq/L (ref 137–147)
Total Protein: 7.8 g/dL (ref 6.0–8.3)

## 2014-05-03 LAB — CBC WITH DIFFERENTIAL/PLATELET
Basophils Absolute: 0 10*3/uL (ref 0.0–0.1)
Basophils Relative: 1 % (ref 0–1)
Eosinophils Absolute: 0.2 10*3/uL (ref 0.0–0.7)
Eosinophils Relative: 3 % (ref 0–5)
HCT: 40.7 % (ref 36.0–46.0)
HEMOGLOBIN: 13.3 g/dL (ref 12.0–15.0)
LYMPHS ABS: 2 10*3/uL (ref 0.7–4.0)
LYMPHS PCT: 26 % (ref 12–46)
MCH: 27.4 pg (ref 26.0–34.0)
MCHC: 32.7 g/dL (ref 30.0–36.0)
MCV: 83.9 fL (ref 78.0–100.0)
MONOS PCT: 8 % (ref 3–12)
Monocytes Absolute: 0.6 10*3/uL (ref 0.1–1.0)
NEUTROS PCT: 62 % (ref 43–77)
Neutro Abs: 4.9 10*3/uL (ref 1.7–7.7)
PLATELETS: 329 10*3/uL (ref 150–400)
RBC: 4.85 MIL/uL (ref 3.87–5.11)
RDW: 14.6 % (ref 11.5–15.5)
WBC: 7.8 10*3/uL (ref 4.0–10.5)

## 2014-05-03 LAB — PREGNANCY, URINE: Preg Test, Ur: NEGATIVE

## 2014-05-03 LAB — URINALYSIS, ROUTINE W REFLEX MICROSCOPIC
Bilirubin Urine: NEGATIVE
Glucose, UA: NEGATIVE mg/dL
HGB URINE DIPSTICK: NEGATIVE
Ketones, ur: NEGATIVE mg/dL
Nitrite: NEGATIVE
PROTEIN: NEGATIVE mg/dL
SPECIFIC GRAVITY, URINE: 1.027 (ref 1.005–1.030)
Urobilinogen, UA: 1 mg/dL (ref 0.0–1.0)
pH: 6 (ref 5.0–8.0)

## 2014-05-03 LAB — WET PREP, GENITAL: Yeast Wet Prep HPF POC: NONE SEEN

## 2014-05-03 LAB — TROPONIN I: Troponin I: <0.3 ng/mL (ref <0.30)

## 2014-05-03 LAB — URINE MICROSCOPIC-ADD ON

## 2014-05-03 LAB — LIPASE, BLOOD: Lipase: 13 U/L (ref 11–59)

## 2014-05-03 LAB — D-DIMER, QUANTITATIVE: D-Dimer, Quant: 0.42 ug/mL-FEU (ref 0.00–0.48)

## 2014-05-03 LAB — POC OCCULT BLOOD, ED: Fecal Occult Bld: NEGATIVE

## 2014-05-03 LAB — HIV ANTIBODY (ROUTINE TESTING W REFLEX): HIV: NONREACTIVE

## 2014-05-03 MED ORDER — SODIUM CHLORIDE 0.9 % IV BOLUS (SEPSIS)
1000.0000 mL | Freq: Once | INTRAVENOUS | Status: AC
  Administered 2014-05-03: 1000 mL via INTRAVENOUS

## 2014-05-03 MED ORDER — SODIUM CHLORIDE 0.9 % IV BOLUS (SEPSIS)
500.0000 mL | Freq: Once | INTRAVENOUS | Status: AC
  Administered 2014-05-03: 500 mL via INTRAVENOUS

## 2014-05-03 MED ORDER — METRONIDAZOLE 500 MG PO TABS: 500.0000 mg | ORAL_TABLET | Freq: Two times a day (BID) | ORAL | Status: DC

## 2014-05-03 MED ORDER — AZITHROMYCIN 1 G PO PACK
2.0000 g | PACK | Freq: Once | ORAL | Status: AC
  Administered 2014-05-03: 2 g via ORAL
  Filled 2014-05-03: qty 2

## 2014-05-03 NOTE — ED Notes (Signed)
Patient reminded that she need to keep right arm straight in order for the IV fluids to run. Patient frequently texting. Blankets placed under right arm in order to keep right arm straight

## 2014-05-03 NOTE — Discharge Instructions (Signed)
Please call your doctor for a followup appointment within 24-48 hours. When you talk to your doctor please let them know that you were seen in the emergency department and have them acquire all of your records so that they can discuss the findings with you and formulate a treatment plan to fully care for your new and ongoing problems. Please call and set-up an appointment with OBGYN to be seen and assessed - will need to have follow-up regarding ovarian cyst noted to the right side Please call and set-up an appointment Health Department  Please rest and stay hydrated Please take medications as prescribed and on a full stomach  Please do not have any sexual encounters until re-assessed and medically cleared Please continue to monitor symptoms closely and if symptoms are to worsen or change (fever greater than 101, chills, sweating, nausea, vomiting, chest pain, shortness of breathe, difficulty breathing, weakness, numbness, tingling, worsening or changes to pain pattern, back pain, neck pain, abnormal vaginal bleeding, blood in the stools, black tarry stools, fainting) please report back to the Emergency Department immediately.    Ovarian Cyst An ovarian cyst is a fluid-filled sac that forms on an ovary. The ovaries are small organs that produce eggs in women. Various types of cysts can form on the ovaries. Most are not cancerous. Many do not cause problems, and they often go away on their own. Some may cause symptoms and require treatment. Common types of ovarian cysts include:  Functional cysts--These cysts may occur every month during the menstrual cycle. This is normal. The cysts usually go away with the next menstrual cycle if the woman does not get pregnant. Usually, there are no symptoms with a functional cyst.  Endometrioma cysts--These cysts form from the tissue that lines the uterus. They are also called "chocolate cysts" because they become filled with blood that turns brown. This type of  cyst can cause pain in the lower abdomen during intercourse and with your menstrual period.  Cystadenoma cysts--This type develops from the cells on the outside of the ovary. These cysts can get very big and cause lower abdomen pain and pain with intercourse. This type of cyst can twist on itself, cut off its blood supply, and cause severe pain. It can also easily rupture and cause a lot of pain.  Dermoid cysts--This type of cyst is sometimes found in both ovaries. These cysts may contain different kinds of body tissue, such as skin, teeth, hair, or cartilage. They usually do not cause symptoms unless they get very big.  Theca lutein cysts--These cysts occur when too much of a certain hormone (human chorionic gonadotropin) is produced and overstimulates the ovaries to produce an egg. This is most common after procedures used to assist with the conception of a baby (in vitro fertilization). CAUSES   Fertility drugs can cause a condition in which multiple large cysts are formed on the ovaries. This is called ovarian hyperstimulation syndrome.  A condition called polycystic ovary syndrome can cause hormonal imbalances that can lead to nonfunctional ovarian cysts. SIGNS AND SYMPTOMS  Many ovarian cysts do not cause symptoms. If symptoms are present, they may include:  Pelvic pain or pressure.  Pain in the lower abdomen.  Pain during sexual intercourse.  Increasing girth (swelling) of the abdomen.  Abnormal menstrual periods.  Increasing pain with menstrual periods.  Stopping having menstrual periods without being pregnant. DIAGNOSIS  These cysts are commonly found during a routine or annual pelvic exam. Tests may be ordered to find  out more about the cyst. These tests may include:  Ultrasound.  X-ray of the pelvis.  CT scan.  MRI.  Blood tests. TREATMENT  Many ovarian cysts go away on their own without treatment. Your health care provider may want to check your cyst regularly for  2-3 months to see if it changes. For women in menopause, it is particularly important to monitor a cyst closely because of the higher rate of ovarian cancer in menopausal women. When treatment is needed, it may include any of the following:  A procedure to drain the cyst (aspiration). This may be done using a long needle and ultrasound. It can also be done through a laparoscopic procedure. This involves using a thin, lighted tube with a tiny camera on the end (laparoscope) inserted through a small incision.  Surgery to remove the whole cyst. This may be done using laparoscopic surgery or an open surgery involving a larger incision in the lower abdomen.  Hormone treatment or birth control pills. These methods are sometimes used to help dissolve a cyst. HOME CARE INSTRUCTIONS   Only take over-the-counter or prescription medicines as directed by your health care provider.  Follow up with your health care provider as directed.  Get regular pelvic exams and Pap tests. SEEK MEDICAL CARE IF:   Your periods are late, irregular, or painful, or they stop.  Your pelvic pain or abdominal pain does not go away.  Your abdomen becomes larger or swollen.  You have pressure on your bladder or trouble emptying your bladder completely.  You have pain during sexual intercourse.  You have feelings of fullness, pressure, or discomfort in your stomach.  You lose weight for no apparent reason.  You feel generally ill.  You become constipated.  You lose your appetite.  You develop acne.  You have an increase in body and facial hair.  You are gaining weight, without changing your exercise and eating habits.  You think you are pregnant. SEEK IMMEDIATE MEDICAL CARE IF:   You have increasing abdominal pain.  You feel sick to your stomach (nauseous), and you throw up (vomit).  You develop a fever that comes on suddenly.  You have abdominal pain during a bowel movement.  Your menstrual periods  become heavier than usual. MAKE SURE YOU:  Understand these instructions.  Will watch your condition.  Will get help right away if you are not doing well or get worse. Document Released: 05/13/2005 Document Revised: 05/18/2013 Document Reviewed: 01/18/2013 [facility] Patient Information 2015 [place], [place]. This information is not intended to replace advice given to you by your health care provider. Make sure you discuss any questions you have with your health care provider.  Trichomoniasis Trichomoniasis is an infection caused by an organism called Trichomonas. The infection can affect both women and men. In women, the outer female genitalia and the vagina are affected. In men, the penis is mainly affected, but the prostate and other reproductive organs can also be involved. Trichomoniasis is a sexually transmitted infection (STI) and is most often passed to another person through sexual contact.  RISK FACTORS  Having unprotected sexual intercourse.  Having sexual intercourse with an infected partner. SIGNS AND SYMPTOMS  Symptoms of trichomoniasis in women include:  Abnormal gray-green frothy vaginal discharge.  Itching and irritation of the vagina.  Itching and irritation of the area outside the vagina. Symptoms of trichomoniasis in men include:   Penile discharge with or without pain.  Pain during urination. This results from inflammation of the  urethra. DIAGNOSIS  Trichomoniasis may be found during a Pap test or physical exam. Your health care provider may use one of the following methods to help diagnose this infection:  Examining vaginal discharge under a microscope. For men, urethral discharge would be examined.  Testing the pH of the vagina with a test tape.  Using a vaginal swab test that checks for the Trichomonas organism. A test is available that provides results within a few minutes.  Doing a culture test for the organism. This is not usually needed. TREATMENT    You may be given medicine to fight the infection. Women should inform their health care provider if they could be or are pregnant. Some medicines used to treat the infection should not be taken during pregnancy.  Your health care provider may recommend over-the-counter medicines or creams to decrease itching or irritation.  Your sexual partner will need to be treated if infected. HOME CARE INSTRUCTIONS   Take medicines only as directed by your health care provider.  Take over-the-counter medicine for itching or irritation as directed by your health care provider.  Do not have sexual intercourse while you have the infection.  Women should not douche or wear tampons while they have the infection.  Discuss your infection with your partner. Your partner may have gotten the infection from you, or you may have gotten it from your partner.  Have your sex partner get examined and treated if necessary.  Practice safe, informed, and protected sex.  See your health care provider for other STI testing. SEEK MEDICAL CARE IF:   You still have symptoms after you finish your medicine.  You develop abdominal pain.  You have pain when you urinate.  You have bleeding after sexual intercourse.  You develop a rash.  Your medicine makes you sick or makes you throw up (vomit). MAKE SURE YOU:  Understand these instructions.  Will watch your condition.  Will get help right away if you are not doing well or get worse. Document Released: 11/06/2000 Document Revised: 09/27/2013 Document Reviewed: 02/22/2013 [facility] Patient Information 2015 [place], [place]. This information is not intended to replace advice given to you by your health care provider. Make sure you discuss any questions you have with your health care provider.   Emergency Department Resource Guide 1) Find a Doctor and Pay Out of Pocket Although you won't have to find out who is covered by your insurance plan, it is a good idea  to ask around and get recommendations. You will then need to call the office and see if the doctor you have chosen will accept you as a new patient and what types of options they offer for patients who are self-pay. Some doctors offer discounts or will set up payment plans for their patients who do not have insurance, but you will need to ask so you aren't surprised when you get to your appointment.  2) Contact Your Local Health Department Not all health departments have doctors that can see patients for sick visits, but many do, so it is worth a call to see if yours does. If you don't know where your local health department is, you can check in your phone book. The CDC also has a tool to help you locate your state's health department, and many state websites also have listings of all of their local health departments.  3) Find a Walk-in Clinic If your illness is not likely to be very severe or complicated, you may want to try a  walk in clinic. These are popping up all over the country in pharmacies, drugstores, and shopping centers. They're usually staffed by nurse practitioners or physician assistants that have been trained to treat common illnesses and complaints. They're usually fairly quick and inexpensive. However, if you have serious medical issues or chronic medical problems, these are probably not your best option.  No Primary Care Doctor: - Call Health Connect at  [phone] - they can help you locate a primary care doctor that  accepts your insurance, provides certain services, etc. - Physician Referral Service- [phone]  Chronic Pain Problems: Organization         Address  Phone   Notes  [facility]  [phone]) [phone] Patients need to be referred by their primary care doctor.   Medication Assistance: Organization         Address  Phone   Notes  [facility] Medication [facility] [address] [place], [place] [address] [phone]) [phone] --Must  be a resident of [facility] -- Must have NO insurance coverage whatsoever (no Medicaid/ Medicare, etc.) -- The pt. MUST have a primary care doctor that directs their care regularly and follows them in the community   MedAssist  [phone]   [employer]  [phone]    Agencies that provide inexpensive medical care: Organization         Address  Phone   Notes  [facility]  [phone]   [facility]    [phone]) [phone]   [facility] [address] [place], [place] [address] [phone]) [phone]   [facility] [address], [place] [phone]) [phone]   [facility]anned Parenthood    [phone]   [facility]    [phone]) [phone]   [facility] and [facility]  [address], [place] Phone:  [phone], Fax:  [phone] Hours of Operation:  9 am - 6 pm, M-F.  Also accepts Medicaid/Medicare and self-pay.  [facility]  [address], [place] Phone: [phone], Fax: [phone]. Hours of Operation:  8:30 am - 5:30 pm, M-F.  Also accepts Medicaid and self-pay.  [facility] [place] [address], [place] Phone: [phone]) [phone]   [facility] [address], [place], [place] [phone], Ext. [phone] Mondays & Thursdays: 7-9 AM.  First 15 patients are seen on a first come, first serve basis.    Medicaid-accepting [facility] Providers:  Organization         Address  Phone   Notes  [facility] [address], [place] [phone] Also accepts self-pay patients.  [facility] [address], [place]  [phone]) [phone]   [facility] [address], [place] [phone]) [phone]   [facility] [address] [place] [address], [place] [phone]) [phone]   Renaye RakersVeita Bland [address], [place]   [phone] Only accepts  [place] Access [place] patients after they have their name applied to their card.   Self-Pay (no insurance) in [facility]:  Organization         Address  Phone   Notes  Sickle Cell Patients, [facility] [address], [place] [phone]) [phone]   [facility] [address], [place] [phone]   [facility] [place]  [address], [place] [phone]   [facility]/Dr. Osei-Bonsu  [address], [place] or [address], [place] [phone] Phone number for both [place] and [place] locations is the same.  [facility] [address] [place] [phone]   [facility] [address], [place] or [address] [phone]   [facility] [address] [place] [phone], phone; [phone], fax Sees patients 1st and 3rd Saturday of every month.  Must not qualify for public or private insurance (i.e. Medicaid, Medicare, [place] Health Choice, Veterans' Benefits)  Household income should be no more than 200% of the poverty level The clinic cannot treat you if you are pregnant or think you are pregnant  Sexually transmitted diseases are not treated at the clinic.    Dental Care: Organization         Address  Phone  Notes  [facility] Department of [facility] [address] [place], [place] [phone] Accepts children up to age 97 who are enrolled in [place] or [place] Health Choice; pregnant women with a Medicaid card; and children who have applied for Medicaid or [place] Health Choice, but were declined, whose parents can pay a reduced fee at time of service.  [facility] Department of [facility]  [address], [place] [phone] Accepts children up to age 22 who are enrolled in [place] or [place] Health Choice; pregnant women  with a Medicaid card; and children who have applied for Medicaid or  Health Choice, but were declined, whose parents can pay a reduced fee at time of service.  [facility]  [address] [place], [place] [phone] Patients are seen by appointment only. Walk-ins are not accepted. [facility] will see patients 80 years of age and older. Monday - Tuesday (8am-5pm) Most Wednesdays (8:30-5pm) $30 per visit, cash only  [facility] Adult Dental Access PROGRAM  [address], [facility] [phone] Patients are seen by appointment only. Walk-ins are not accepted. [facility] will see patients 82 years of age and older. One Wednesday Evening (Monthly: Volunteer Based).  $30 per visit, cash only  [employer]  [phone] for adults; Children under age 64, call Graduate Pediatric Dentistry at [phone]. Children aged 83-14, please call [phone] to request a pediatric application.  Dental services are provided in all areas of dental care including fillings, crowns and bridges, complete and partial dentures, implants, gum treatment, root canals, and extractions. Preventive care is also provided. Treatment is provided to both adults and children. Patients are selected via a lottery and there is often a waiting list.   [facility] [address] [place]  [phone] [email or web address]   [facility] [address] [place], [place] [phone], Ext. [phone] Second and Fourth Thursday of each month, opens at 6:30 AM; Clinic ends at 9 AM.  Patients are seen on a first-come first-served basis, and a limited number are seen during each clinic.   [facility]  [address] [place], [place] [phone]   Eligibility Requirements You must have lived in [place], [place], or [place] counties for at least the last three months.   You cannot be eligible for state or federal sponsored healthcare  insurance, including [employer], [place], or [employer].   You generally cannot be eligible for healthcare insurance through your employer.    How to apply: Eligibility screenings are held every Tuesday and Wednesday afternoon from 1:00 pm until 4:00 pm. You do not need an appointment for the interview!  [facility] [address], [place], [place] [phone]   [facility] Health Department  [phone]   [facility] Health Department  [phone]   [facility] Health Department  [phone]    Behavioral Health Resources in the Community: Intensive Outpatient Programs Organization         Address  Phone  Notes  [facility] Services [address], [place], [place] [phone]   [facility] Outpatient [address], [place], [place] [phone]   [facility] [address], [place], [place]  [phone]   [facility] Mental Health [address],  [place], [place] [phone] or [phone]   Substance Abuse Resources Organization         Address  Phone  Notes  Alcohol [facility] Drug Services  [phone]   [facility]  [phone]   [facility]  [phone]   Floydene Flock  [phone]   Residential & Outpatient Substance Abuse Program  [phone]   Psychological Services Organization         Address  Phone  Notes  [facility] Behavioral Health  [phone]   [facility] Services  [phone]   [facility] Mental Health [address], [place] [phone] or [phone]    Mobile Crisis Teams Organization         Address  Phone  Notes  [facility], Mobile Crisis Care Unit  [phone]   [facility]  [address] [place], [place] [phone]   Doristine Locks [address] [place] [phone]    Self-Help/Support Groups Organization         Address  Phone             Notes  [facility]  [facility] [place] - variety of support groups  [phone]- [phone] Call for more information  Narcotics Anonymous (NA), [facility] [address], [employer] [place]  2 meetings at this location   Statistician         Address  Phone  Notes  [facility] [address],    [place]  [phone]   [facility]  [address], [place] [address], [place], [place] [phone]   [facility] [address] [place], [place] [phone] Admissions: 8am-3pm M-F  [facility] [address],    [place], [place] [phone]   [facility] [address] [place], [place], [place] [phone]   [facility] [address],  [place], [place] [phone]   [facility] - Intensive Outpatient [address], [place], [place] [phone]   [facility] ([facility] [address] [place]  [place], [place] [phone] or [phone]   [facility] ([facility]) [address], [place], [place] [phone] Accepts Medicaid  [facility] [address],  [place] [phone] Substance Abuse/Addiction Treatment   [facility] Organization         Address  Phone  Notes  [facility]  [phone]   Angie Fava, PhD [address] [place], [place]   [phone] or [phone])  [phone]   [facility]   [address] [place], [place] [phone]) [phone]   [facility] [address], [place], [place] [phone]) [phone] Insurance/Medicaid/sponsorship through [facility]  [facility] [address]                                    [place], [place] [phone]) [phone] Therapy/tele-psych/case  [facility] [address]   [place], [place] [phone]) [phone]    Dr. Lolly MustacheArfeen  [phone](336) [phone]   [facility]  [employer] [facility] 1) [address],  [place] 2) [address], [place] 3)  [address], [place] [phone]) [phone]  [phone]) [phone]   [facility] [facility] [phone] or [phone](336) [phone] (After Hours)

## 2014-05-03 NOTE — ED Notes (Signed)
Pelvic setup at bedside.

## 2014-05-03 NOTE — ED Provider Notes (Signed)
CSN: [id number]     Arrival date & time 05/03/14  1123 History   First MD Initiated Contact with Patient 05/03/14 1201     Chief Complaint  Patient presents with  . Abdominal Pain     (Consider location/radiation/quality/duration/timing/severity/associated sxs/prior Treatment) The history is provided by the patient. No language interpreter was used.  Tasha Hughes is a 23 year old female with past medical history PCOS, asthma, GERD presenting to the ED with abdominal pain that is been ongoing for approximately 3-4 months localized to lower portion of her abdomen described as a mild cramping sensation. Patient reports the pain is worse in the morning on waking up. Stated that at times she has intermittent pain to the epigastric region described as a sharp discomfort. Stated that she's been using nothing for the pain. Reported intermittent nausea over the past couple of days. Stated that she has cut out spicy foods, fried foods but continues to drink sodas and carbonated drinks. Reported that she had intermittent melenic stools last week, denied any recent episodes-reports that she does take approximately one [facility] powder for head daily. Stated that she's noticed that she's been having shortness of breath with increased activity of the past 4-5 weeks. LMP a couple of days ago. Denied fever, chills, neck pain, neck stiffness, back pain, cough, travel birth control, chest pain, difficulty breathing, dysuria, hematuria, abnormal vaginal bleeding, vaginal discharge, vaginal pain, diaphoresis. PCP none  Past Medical History  Diagnosis Date  . Asthma   . Polycystic ovarian disease   . GERD (gastroesophageal reflux disease)    Past Surgical History  Procedure Laterality Date  . Tonsillectomy     Family History  Problem Relation Age of Onset  . Hypertension Mother    History  Substance Use Topics  . Smoking status: Current Every Day Smoker -- 1.00 packs/day    Types: Cigarettes  . Smokeless  tobacco: Never Used  . Alcohol Use: Yes     Comment: rarely   OB History    No data available     Review of Systems  Constitutional: Negative for fever and chills.  Respiratory: Positive for shortness of breath. Negative for chest tightness.   Cardiovascular: Negative for chest pain.  Gastrointestinal: Positive for abdominal pain and blood in stool. Negative for nausea, vomiting, diarrhea, constipation and anal bleeding.  Genitourinary: Negative for dysuria, decreased urine volume, vaginal bleeding, vaginal discharge and vaginal pain.  Musculoskeletal: Negative for back pain and neck pain.  Neurological: Negative for dizziness, weakness and headaches.      Allergies  Penicillins; Robitussin; and Shrimp  Home Medications   Prior to Admission medications   Medication Sig Start Date End Date Taking? Authorizing Provider  Aspirin-Salicylamide-Caffeine (BC HEADACHE POWDER PO) Take 1 packet by mouth daily as needed (Headache). For headache   Yes Historical Provider, MD  ciprofloxacin (CIPRO) 500 MG tablet Take 1 tablet (500 mg total) by mouth 2 (two) times daily. Patient not taking: Reported on 05/03/2014 09/25/13   Richardean Canal, MD  metroNIDAZOLE (FLAGYL) 500 MG tablet Take 1 tablet (500 mg total) by mouth 2 (two) times daily. 05/03/14   Aleksandra Raben, PA-C   BP 118/95 mmHg  Pulse 76  Temp(Src) 98.6 F (37 C) (Oral)  Resp 18  SpO2 100% Physical Exam  Constitutional: She is oriented to person, place, and time. She appears well-developed and well-nourished. No distress.  HENT:  Head: Normocephalic and atraumatic.  Eyes: Conjunctivae and EOM are normal. Pupils are equal, round, and reactive  to light. Right eye exhibits no discharge. Left eye exhibits no discharge.  Neck: Normal range of motion. Neck supple. No tracheal deviation present.  Cardiovascular: Normal rate, regular rhythm and normal heart sounds.   Pulses:      Radial pulses are 2+ on the right side, and 2+ on the left  side.       Dorsalis pedis pulses are 2+ on the right side, and 2+ on the left side.  Pulmonary/Chest: Effort normal and breath sounds normal. No respiratory distress. She has no wheezes. She has no rales.  Abdominal: Soft. Bowel sounds are normal. She exhibits no distension. There is tenderness. There is no rebound, no guarding and no CVA tenderness.  Obese Bowel sounds normoactive in all 4 quadrants Abdomen soft upon palpation Negative pain upon palpation to the abdomen Negative peritoneal signs Negative according rigidity  Genitourinary: Vaginal discharge found.  Pelvic Exam: Negative swelling, erythema, inflammation, lesions, sores, deformities, abnormalities noted to the external genitalia. Negative blood in the vaginal vault. Thick white discharge identified. Cervix identified negative friability. Negative CMT tenderness. Negative bilateral adnexal tenderness identified. Exam chaperoned with tech, Raynelle Fanning  Rectal exam: Negative swelling, erythema, inflammation, lesions, hemorrhoids. Negative bright red blood per rectum. Negative palpation of masses in the rectum. Negative blood on glove. Brown stools noted on glove. Strong rectal tone noted. Exam chaperoned with tech, Raynelle Fanning  Musculoskeletal: Normal range of motion.  Lymphadenopathy:    She has no cervical adenopathy.  Neurological: She is alert and oriented to person, place, and time. No cranial nerve deficit. She exhibits normal muscle tone. Coordination normal.  Skin: Skin is warm and dry. No rash noted. She is not diaphoretic. No erythema.  Psychiatric: She has a normal mood and affect. Her behavior is normal. Thought content normal.  Nursing note and vitals reviewed.   ED Course  Procedures (including critical care time)  Results for orders placed or performed during the hospital encounter of 05/03/14  Wet prep, genital  Result Value Ref Range   Yeast Wet Prep HPF POC NONE SEEN NONE SEEN   Trich, Wet Prep FEW (A) NONE SEEN    Clue Cells Wet Prep HPF POC FEW (A) NONE SEEN   WBC, Wet Prep HPF POC FEW (A) NONE SEEN  CBC with Differential  Result Value Ref Range   WBC 7.8 4.0 - 10.5 K/uL   RBC 4.85 3.87 - 5.11 MIL/uL   Hemoglobin 13.3 12.0 - 15.0 g/dL   HCT 16.1 09.6 - 04.5 %   MCV 83.9 78.0 - 100.0 fL   MCH 27.4 26.0 - 34.0 pg   MCHC 32.7 30.0 - 36.0 g/dL   RDW 40.9 81.1 - 91.4 %   Platelets 329 150 - 400 K/uL   Neutrophils Relative % 62 43 - 77 %   Neutro Abs 4.9 1.7 - 7.7 K/uL   Lymphocytes Relative 26 12 - 46 %   Lymphs Abs 2.0 0.7 - 4.0 K/uL   Monocytes Relative 8 3 - 12 %   Monocytes Absolute 0.6 0.1 - 1.0 K/uL   Eosinophils Relative 3 0 - 5 %   Eosinophils Absolute 0.2 0.0 - 0.7 K/uL   Basophils Relative 1 0 - 1 %   Basophils Absolute 0.0 0.0 - 0.1 K/uL  Comprehensive metabolic panel  Result Value Ref Range   Sodium 147 137 - 147 mEq/L   Potassium 3.9 3.7 - 5.3 mEq/L   Chloride 95 (L) 96 - 112 mEq/L   CO2 25 19 -  32 mEq/L   Glucose, Bld 87 70 - 99 mg/dL   BUN 14 6 - 23 mg/dL   Creatinine, Ser 1.610.62 0.50 - 1.10 mg/dL   Calcium 9.4 8.4 - 09.610.5 mg/dL   Total Protein 7.8 6.0 - 8.3 g/dL   Albumin 3.5 3.5 - 5.2 g/dL   AST 18 0 - 37 U/L   ALT 22 0 - 35 U/L   Alkaline Phosphatase 90 39 - 117 U/L   Total Bilirubin <0.2 (L) 0.3 - 1.2 mg/dL   GFR calc non Af Amer >90 >90 mL/min   GFR calc Af Amer >90 >90 mL/min   Anion gap 27 (H) 5 - 15  Urinalysis, Routine w reflex microscopic  Result Value Ref Range   Color, Urine YELLOW YELLOW   APPearance CLEAR CLEAR   Specific Gravity, Urine 1.027 1.005 - 1.030   pH 6.0 5.0 - 8.0   Glucose, UA NEGATIVE NEGATIVE mg/dL   Hgb urine dipstick NEGATIVE NEGATIVE   Bilirubin Urine NEGATIVE NEGATIVE   Ketones, ur NEGATIVE NEGATIVE mg/dL   Protein, ur NEGATIVE NEGATIVE mg/dL   Urobilinogen, UA 1.0 0.0 - 1.0 mg/dL   Nitrite NEGATIVE NEGATIVE   Leukocytes, UA TRACE (A) NEGATIVE  Urine microscopic-add on  Result Value Ref Range   Squamous Epithelial / LPF FEW (A)  RARE   WBC, UA 3-6 <3 WBC/hpf   Bacteria, UA FEW (A) RARE  Pregnancy, urine  Result Value Ref Range   Preg Test, Ur NEGATIVE NEGATIVE  Lipase, blood  Result Value Ref Range   Lipase 13 11 - 59 U/L  Troponin I  Result Value Ref Range   Troponin I <0.30 <0.30 ng/mL  D-dimer, quantitative  Result Value Ref Range   D-Dimer, Quant 0.42 0.00 - 0.48 ug/mL-FEU  POC occult blood, ED Provider will collect  Result Value Ref Range   Fecal Occult Bld NEGATIVE NEGATIVE  I-stat chem 8, ed  Result Value Ref Range   Sodium 140 137 - 147 mEq/L   Potassium 3.9 3.7 - 5.3 mEq/L   Chloride 105 96 - 112 mEq/L   BUN 14 6 - 23 mg/dL   Creatinine, Ser 0.450.50 0.50 - 1.10 mg/dL   Glucose, Bld 409103 (H) 70 - 99 mg/dL   Calcium, Ion 8.111.20 1.12 - 1.23 mmol/L   TCO2 22 0 - 100 mmol/L   Hemoglobin 13.9 12.0 - 15.0 g/dL   HCT 91.441.0 78.236.0 - 95.646.0 %    Labs Review Labs Reviewed  WET PREP, GENITAL - Abnormal; Notable for the following:    Trich, Wet Prep FEW (*)    Clue Cells Wet Prep HPF POC FEW (*)    WBC, Wet Prep HPF POC FEW (*)    All other components within normal limits  COMPREHENSIVE METABOLIC PANEL - Abnormal; Notable for the following:    Chloride 95 (*)    Total Bilirubin <0.2 (*)    Anion gap 27 (*)    All other components within normal limits  URINALYSIS, ROUTINE W REFLEX MICROSCOPIC - Abnormal; Notable for the following:    Leukocytes, UA TRACE (*)    All other components within normal limits  URINE MICROSCOPIC-ADD ON - Abnormal; Notable for the following:    Squamous Epithelial / LPF FEW (*)    Bacteria, UA FEW (*)    All other components within normal limits  I-STAT CHEM 8, ED - Abnormal; Notable for the following:    Glucose, Bld 103 (*)    All other components  within normal limits  URINE CULTURE  GC/CHLAMYDIA PROBE AMP  CBC WITH DIFFERENTIAL  PREGNANCY, URINE  LIPASE, BLOOD  TROPONIN I  D-DIMER, QUANTITATIVE  HIV ANTIBODY (ROUTINE TESTING)  POC OCCULT BLOOD, ED    Imaging  Review US Transvaginal Non-ob  05/03/2014   CLINICAL DATA:  Midline pelvic pain for 4 months, cramping  EXAM: TRANSABDOMINAL AND TRANSVAGINAL ULTRASOUND OF PELVIS  TECHNIQUE: Both transabdominal and transvaginal ultrasound examinations of the pelvis were performed. Transabdominal technique was performed for global imaging of the pelvis including uterus, ovaries, adnexal regions, and pelvic cul-de-sac. It was necessary to proceed with endovaginal exam following the transabdominal exam to visualize the ovaries and endometrium.  COMPARISON:  None  FINDINGS: Uterus  Measurements: 6.4 x 2.8 x 3.2 cm. No fibroids or other mass visualized.  Endometrium  Thickness: 4.2 mm.  No focal abnormality visualized.  Right ovary  Measurements: 3.4 x 2.3 x 1.9 cm. 1.6 x 1.3 x 1.1 cm hyperechoic prior ovarian mass without significant posterior acoustic shadowing.  Left ovary  Measurements: 2.7 x 2 x 1.6 cm. Normal appearance/no adnexal mass.  Other findings  No free fluid.  IMPRESSION: 1. No acute pelvic abnormality. 2. Hyperechoic right ovarian mass measuring 1.6 x 1.3 x 1.1 cm. Differential diagnosis includes acute hemorrhagic cyst versus dermoid versus proteinaceous cyst. There is no significant posterior acoustic shadowing.   Electronically Signed   By: Elige Ko   On: 05/03/2014 15:45   US Pelvis Complete  05/03/2014   CLINICAL DATA:  Midline pelvic pain for 4 months, cramping  EXAM: TRANSABDOMINAL AND TRANSVAGINAL ULTRASOUND OF PELVIS  TECHNIQUE: Both transabdominal and transvaginal ultrasound examinations of the pelvis were performed. Transabdominal technique was performed for global imaging of the pelvis including uterus, ovaries, adnexal regions, and pelvic cul-de-sac. It was necessary to proceed with endovaginal exam following the transabdominal exam to visualize the ovaries and endometrium.  COMPARISON:  None  FINDINGS: Uterus  Measurements: 6.4 x 2.8 x 3.2 cm. No fibroids or other mass visualized.  Endometrium   Thickness: 4.2 mm.  No focal abnormality visualized.  Right ovary  Measurements: 3.4 x 2.3 x 1.9 cm. 1.6 x 1.3 x 1.1 cm hyperechoic prior ovarian mass without significant posterior acoustic shadowing.  Left ovary  Measurements: 2.7 x 2 x 1.6 cm. Normal appearance/no adnexal mass.  Other findings  No free fluid.  IMPRESSION: 1. No acute pelvic abnormality. 2. Hyperechoic right ovarian mass measuring 1.6 x 1.3 x 1.1 cm. Differential diagnosis includes acute hemorrhagic cyst versus dermoid versus proteinaceous cyst. There is no significant posterior acoustic shadowing.   Electronically Signed   By: Elige Ko   On: 05/03/2014 15:45     EKG Interpretation   Date/Time:  Tuesday May 03 2014 13:32:43 EST Ventricular Rate:  70 PR Interval:  167 QRS Duration: 98 QT Interval:  405 QTC Calculation: 437 R Axis:   58 Text Interpretation:  Sinus arrhythmia Low voltage, precordial leads  Baseline wander in lead(s) III aVF V3 V4 V5 No old tracing to compare  Confirmed by [facility]  MD, MARTHA [phone]) on 05/03/2014 2:52:01 PM      MDM   Final diagnoses:  Pelvic pain in female  Trichomonas infection  Cyst of right ovary    Medications  azithromycin (ZITHROMAX) powder 2 g (not administered)  sodium chloride 0.9 % bolus 500 mL (0 mLs Intravenous Stopped 05/03/14 1622)  sodium chloride 0.9 % bolus 1,000 mL (1,000 mLs Intravenous New Bag/Given 05/03/14 1621)    Ceasar Mons  Vitals:   05/03/14 1131 05/03/14 1416 05/03/14 1455  BP:  112/60 118/95  Pulse: 90 86 76  Temp: 98.3 F (36.8 C) 98.6 F (37 C)   TempSrc: Oral Oral   Resp: 16 16 18   SpO2: 93% 95% 100%   This provider reviewed the patient's chart. Patient has history of GC/Chlamydia.  EKG noted sinus arrhythmia with a heart rate of 70 bpm. Troponin negative elevation. D-dimer negative elevation. CBC unremarkable-negative elevated leukocytosis. Hemoglobin 13.3, hematocrit 40.7. CMP noted glucose of 87. BUN/creatinine within normal limits.  Negative elevated liver enzymes. Anion gap elevated at 27 mEq per liter. Lipase negative elevation. Urinalysis negative for infection-negative nitrites. Trace of leukocytes identified with white blood cell count 3-6, few squamous cells noted-appears to be contaminated specimen. Urine culture pending. Urine pregnancy negative. Fecal occult negative. Wet prep noted future components with few clue cells and a few white blood cells-GC Chlamydia probe pending and HIV pending. Pelvic ultrasound no acute pelvic abnormalities identified-hyper echoic right ovarian mass measuring approximately 1.6 x 1.3 x 1.1 cm-differentials include acute hemorrhagic cysts versus dermoid versus proteinaceous cyst. Repeat chem 8 identified decrease in anion gap after fluids administered via IV-decreased from 27.0 to 13.0 mg/L. Negative elevated d-dimer-doubt PE--negative tachycardia, tachypnea, hypoxia. Suspicion of shortness of breath secondary to obesity and history of asthma. Fecal occult negative, hemoglobin within normal limits-doubt acute GI bleed. Doubt pancreatitis. Negative findings of UTI or pyelonephritis. Trichomonas identified on wet prep-GC Chlamydia probe pending as well as HIV pending. Patient treated prophylactically in the ED setting with azithromycin 2 g secondary to penicillin allergy that is anaphylactic. Pelvic ultrasound identified right ovarian cyst-no acute findings. Negative findings of TOA, ovarian torsion. Doubt pregnancy, ectopic pregnancy or molar pregnancy. Patient will need OB/GYN follow-up as an outpatient. Patient given IV fluids while in ED setting with decrease in anion gap. Patient stable, afebrile. Patient not septic appearing. Discharged patient. Referred [facility] to [facility], health department, OB/GYN. Discussed with patient to avoid any sexual encounters. Discharge patient with Flagyl for Trichomonas infection. Discussed with patient to rest and stay hydrated. Recommended patient to  have partner(s) assessed for STDs. Discussed with patient to closely monitor symptoms and if symptoms are to worsen or change to report back to the ED - strict return instructions given.  Patient agreed to plan of care, understood, all questions answered.   Raymon MuttonMarissa Timothy Townsel, PA-C 05/03/14 1742  Ethelda ChickMartha K Linker, MD 05/05/14 [phone]

## 2014-05-03 NOTE — ED Notes (Signed)
Pt c/o of lower medial abdominal pain, with intermittent upper GI pain, pt states she has been unable to sleep prone lately due to the pain.

## 2014-05-03 NOTE — ED Notes (Signed)
Patient transported to Ultrasound 

## 2014-05-04 LAB — URINE CULTURE
COLONY COUNT: NO GROWTH
Culture: NO GROWTH
SPECIAL REQUESTS: NORMAL

## 2014-05-04 LAB — GC/CHLAMYDIA PROBE AMP
CT PROBE, AMP APTIMA: NEGATIVE
GC PROBE AMP APTIMA: NEGATIVE

## 2014-10-19 ENCOUNTER — Inpatient Hospital Stay (HOSPITAL_COMMUNITY)
Admission: AD | Admit: 2014-10-19 | Discharge: 2014-10-19 | Disposition: A | Discharge status: Home / Self Care | Payer: Self-pay | Source: Ambulatory Visit | Attending: Obstetrics & Gynecology | Admitting: Obstetrics & Gynecology

## 2014-10-19 ENCOUNTER — Encounter (HOSPITAL_COMMUNITY): Payer: Self-pay

## 2014-10-19 DIAGNOSIS — J45909 Unspecified asthma, uncomplicated: Secondary | ICD-10-CM | POA: Insufficient documentation

## 2014-10-19 DIAGNOSIS — E282 Polycystic ovarian syndrome: Secondary | ICD-10-CM | POA: Insufficient documentation

## 2014-10-19 DIAGNOSIS — Z880 Allergy status to penicillin: Secondary | ICD-10-CM | POA: Insufficient documentation

## 2014-10-19 DIAGNOSIS — F17210 Nicotine dependence, cigarettes, uncomplicated: Secondary | ICD-10-CM | POA: Insufficient documentation

## 2014-10-19 DIAGNOSIS — K219 Gastro-esophageal reflux disease without esophagitis: Secondary | ICD-10-CM | POA: Insufficient documentation

## 2014-10-19 DIAGNOSIS — R102 Pelvic and perineal pain: Secondary | ICD-10-CM | POA: Insufficient documentation

## 2014-10-19 LAB — URINALYSIS, ROUTINE W REFLEX MICROSCOPIC
BILIRUBIN URINE: NEGATIVE
GLUCOSE, UA: NEGATIVE mg/dL
KETONES UR: NEGATIVE mg/dL
Leukocytes, UA: NEGATIVE
Nitrite: NEGATIVE
Protein, ur: NEGATIVE mg/dL
SPECIFIC GRAVITY, URINE: 1.02 (ref 1.005–1.030)
Urobilinogen, UA: 0.2 mg/dL (ref 0.0–1.0)
pH: 5.5 (ref 5.0–8.0)

## 2014-10-19 LAB — POCT PREGNANCY, URINE: Preg Test, Ur: NEGATIVE

## 2014-10-19 LAB — URINE MICROSCOPIC-ADD ON

## 2014-10-19 LAB — HCG, QUANTITATIVE, PREGNANCY: hCG, Beta Chain, Quant, S: 4 m[IU]/mL (ref <5)

## 2014-10-19 NOTE — MAU Note (Signed)
Pt reports she was diagnosed with a cyst on her left ovary in March, states she has a history of PCOS and has irregular periods but they have been regular this year but her last normal period was 03/18 and she had some spotting in April and again in May. Pt states she is unsure if this is related to the cyst or not. States she has had 3 negative home preg tests. Denies pain at this time.

## 2014-10-19 NOTE — MAU Provider Note (Signed)
History     CSN: [id number]  Arrival date and time: 10/19/14 1324   First Provider Initiated Contact with Patient 10/19/14 2005      Chief Complaint  Patient presents with  . Vaginal Bleeding   HPI  Ms. Tasha Hughes is a 24 y.o. female here with report of concern for past ovarian cyst.  Diagnosed with left ovarian cyst in February per patient.  Upon review of the records patient had a right ovarian cyst in December 2015 measuring 1.6 x 1.3 x 1.1 cm.  Pt reports intermittent pelvic pain.  Last occurrence one week ago.  Denies abnormal vaginal discharge, itching, or odor.  Desires to know if pregnant since she has a history of PCOS and does not want to drink or smoke if she is.  Patient's last menstrual period was 10/17/2014.   Pt has taken 3 pregnancy tests at home and all have been negative.  Pt request a blood pregnancy test.  +nausea, denies vomiting.    Past Medical History  Diagnosis Date  . Asthma   . Polycystic ovarian disease   . GERD (gastroesophageal reflux disease)     Past Surgical History  Procedure Laterality Date  . Tonsillectomy      Family History  Problem Relation Age of Onset  . Hypertension Mother     History  Substance Use Topics  . Smoking status: Current Every Day Smoker -- 1.00 packs/day    Types: Cigarettes  . Smokeless tobacco: Never Used  . Alcohol Use: Yes     Comment: rarely    Allergies:  Allergies  Allergen Reactions  . Penicillins Anaphylaxis and Hives  . Shrimp [Shellfish Allergy] Anaphylaxis, Hives, Itching and Swelling  . Robitussin [Guaifenesin] Hives    Prescriptions prior to admission  Medication Sig Dispense Refill Last Dose  . albuterol (PROVENTIL HFA;VENTOLIN HFA) 108 (90 BASE) MCG/ACT inhaler Inhale 1 puff into the lungs every 6 (six) hours as needed for wheezing or shortness of breath (emergency asthma).   Past Month at Unknown time  . Aspirin-Salicylamide-Caffeine (BC HEADACHE POWDER PO) Take 1 packet by mouth daily  as needed (Headache). For headache   10/19/2014 at Unknown time  . cetirizine (ZYRTEC) 5 MG tablet Take 5 mg by mouth daily as needed for allergies.   Past Month at Unknown time  . ciprofloxacin (CIPRO) 500 MG tablet Take 1 tablet (500 mg total) by mouth 2 (two) times daily. (Patient not taking: Reported on 05/03/2014) 10 tablet 0   . metroNIDAZOLE (FLAGYL) 500 MG tablet Take 1 tablet (500 mg total) by mouth 2 (two) times daily. 14 tablet 0     Review of Systems  Constitutional: Negative for fever.  Gastrointestinal: Positive for nausea. Negative for vomiting, abdominal pain, diarrhea and constipation.  Genitourinary: Negative for dysuria, urgency and frequency.  All other systems reviewed and are negative.  Physical Exam   Blood pressure 119/65, pulse 82, temperature 98.3 F (36.8 C), temperature source Oral, resp. rate 16, height 5' 4.5" (1.638 m), weight 132.904 kg (293 lb), last menstrual period 10/17/2014, SpO2 100 %.  Physical Exam  Constitutional: She is oriented to person, place, and time. She appears well-developed and well-nourished. No distress.  HENT:  Head: Normocephalic.  Neck: Normal range of motion. Neck supple.  Cardiovascular: Normal rate, regular rhythm and normal heart sounds.   Respiratory: Effort normal and breath sounds normal. No respiratory distress.  GI: Soft. There is no tenderness.  Genitourinary: Right adnexum displays no mass and no tenderness.  Left adnexum displays no mass and no tenderness.  Difficult to assess secondary to patient weight  Musculoskeletal: Normal range of motion. She exhibits no edema.  Neurological: She is alert and oriented to person, place, and time. She has normal reflexes.  Skin: Skin is warm and dry.    MAU Course  Procedures Results for orders placed or performed during the hospital encounter of 10/19/14 (from the past 24 hour(s))  Urinalysis, Routine w reflex microscopic     Status: Abnormal   Collection Time: 10/19/14  7:15  PM  Result Value Ref Range   Color, Urine YELLOW YELLOW   APPearance CLEAR CLEAR   Specific Gravity, Urine 1.020 1.005 - 1.030   pH 5.5 5.0 - 8.0   Glucose, UA NEGATIVE NEGATIVE mg/dL   Hgb urine dipstick LARGE (A) NEGATIVE   Bilirubin Urine NEGATIVE NEGATIVE   Ketones, ur NEGATIVE NEGATIVE mg/dL   Protein, ur NEGATIVE NEGATIVE mg/dL   Urobilinogen, UA 0.2 0.0 - 1.0 mg/dL   Nitrite NEGATIVE NEGATIVE   Leukocytes, UA NEGATIVE NEGATIVE  Urine microscopic-add on     Status: Abnormal   Collection Time: 10/19/14  7:15 PM  Result Value Ref Range   Squamous Epithelial / LPF RARE RARE   WBC, UA 0-2 <3 WBC/hpf   RBC / HPF 7-10 <3 RBC/hpf   Bacteria, UA FEW (A) RARE  Pregnancy, urine POC     Status: None   Collection Time: 10/19/14  7:28 PM  Result Value Ref Range   Preg Test, Ur NEGATIVE NEGATIVE  hCG, quantitative, pregnancy     Status: None   Collection Time: 10/19/14  8:42 PM  Result Value Ref Range   hCG, Beta Chain, Quant, S 4 <5 mIU/mL     Assessment and Plan  Pelvic Pain - History of Ovarian Cyst  Plan: Discharge to home Outpatient pelvic ultrasound Follow-up for worsening of symptoms  Marlis EdelsonWalidah N Karim, CNM

## 2014-10-28 ENCOUNTER — Ambulatory Visit (HOSPITAL_COMMUNITY): Payer: No Typology Code available for payment source

## 2014-11-01 ENCOUNTER — Ambulatory Visit (HOSPITAL_COMMUNITY)
Admission: RE | Admit: 2014-11-01 | Discharge: 2014-11-01 | Disposition: A | Discharge status: Home / Self Care | Payer: Self-pay | Source: Ambulatory Visit | Attending: Family | Admitting: Family

## 2014-11-01 DIAGNOSIS — R102 Pelvic and perineal pain: Secondary | ICD-10-CM | POA: Insufficient documentation

## 2014-11-02 ENCOUNTER — Telehealth: Payer: Self-pay | Admitting: Family

## 2014-11-02 NOTE — Telephone Encounter (Signed)
Reviewed ultrasound results with patient.  Pt did not have any questions or concerns.

## 2014-12-08 ENCOUNTER — Emergency Department (HOSPITAL_COMMUNITY)
Admission: EM | Admit: 2014-12-08 | Discharge: 2014-12-08 | Disposition: A | Discharge status: Home / Self Care | Payer: Self-pay | Attending: Emergency Medicine | Admitting: Emergency Medicine

## 2014-12-08 ENCOUNTER — Encounter (HOSPITAL_COMMUNITY): Payer: Self-pay | Admitting: Emergency Medicine

## 2014-12-08 DIAGNOSIS — Z79899 Other long term (current) drug therapy: Secondary | ICD-10-CM | POA: Insufficient documentation

## 2014-12-08 DIAGNOSIS — Z8719 Personal history of other diseases of the digestive system: Secondary | ICD-10-CM | POA: Insufficient documentation

## 2014-12-08 DIAGNOSIS — K088 Other specified disorders of teeth and supporting structures: Secondary | ICD-10-CM | POA: Insufficient documentation

## 2014-12-08 DIAGNOSIS — K089 Disorder of teeth and supporting structures, unspecified: Secondary | ICD-10-CM

## 2014-12-08 DIAGNOSIS — Z720 Tobacco use: Secondary | ICD-10-CM | POA: Insufficient documentation

## 2014-12-08 DIAGNOSIS — Z880 Allergy status to penicillin: Secondary | ICD-10-CM | POA: Insufficient documentation

## 2014-12-08 DIAGNOSIS — K011 Impacted teeth: Secondary | ICD-10-CM

## 2014-12-08 DIAGNOSIS — J45909 Unspecified asthma, uncomplicated: Secondary | ICD-10-CM | POA: Insufficient documentation

## 2014-12-08 DIAGNOSIS — Z8639 Personal history of other endocrine, nutritional and metabolic disease: Secondary | ICD-10-CM | POA: Insufficient documentation

## 2014-12-08 DIAGNOSIS — G8929 Other chronic pain: Secondary | ICD-10-CM | POA: Insufficient documentation

## 2014-12-08 DIAGNOSIS — K029 Dental caries, unspecified: Secondary | ICD-10-CM | POA: Insufficient documentation

## 2014-12-08 MED ORDER — CLINDAMYCIN HCL 300 MG PO CAPS: 300.0000 mg | ORAL_CAPSULE | Freq: Four times a day (QID) | ORAL | Status: DC

## 2014-12-08 MED ORDER — NAPROXEN 500 MG PO TABS: 500.0000 mg | ORAL_TABLET | Freq: Two times a day (BID) | ORAL | Status: DC | PRN

## 2014-12-08 MED ORDER — HYDROCODONE-ACETAMINOPHEN 5-325 MG PO TABS: 1.0000 | ORAL_TABLET | Freq: Four times a day (QID) | ORAL | Status: DC | PRN

## 2014-12-08 NOTE — ED Notes (Signed)
Patient is alert and orientated x 4.  She denies any complaints of N/V or dizzy.  She was able to teach back discharge instructions.

## 2014-12-08 NOTE — ED Notes (Signed)
Pt reporting at this time "i could be pregnant", pt sts "got every symptom in the book".  Pt reports last period in May "btu I have PCOS and it's always irregular".  Pt reports has had urine tests and "and ultrasound to look at my ovary" and all have been negative.  Pt offered to speaking with EDPA about a pregnancy test, but pt declines.

## 2014-12-08 NOTE — Discharge Instructions (Signed)
Apply warm compresses to jaw throughout the day. Take antibiotic until finished. Take naprosyn and norco as directed, as needed for pain but do not drive or operate machinery with pain medication use. Followup with a dentist is very important for ongoing evaluation and management of recurrent dental pain. Call your dentist tomorrow to set up an appointment to take care of your tooth. STOP SMOKING! Follow up with [facility] in 1 week to establish care. Return to emergency department for emergent changing or worsening symptoms.   Dental Pain A tooth ache may be caused by cavities (tooth decay). Cavities expose the nerve of the tooth to air and hot or cold temperatures. It may come from an infection or abscess (also called a boil or furuncle) around your tooth. It is also often caused by dental caries (tooth decay). This causes the pain you are having. DIAGNOSIS  Your caregiver can diagnose this problem by exam. TREATMENT   If caused by an infection, it may be treated with medications which kill germs (antibiotics) and pain medications as prescribed by your caregiver. Take medications as directed.  Only take over-the-counter or prescription medicines for pain, discomfort, or fever as directed by your caregiver.  Whether the tooth ache today is caused by infection or dental disease, you should see your dentist as soon as possible for further care. SEEK MEDICAL CARE IF: The exam and treatment you received today has been provided on an emergency basis only. This is not a substitute for complete medical or dental care. If your problem worsens or new problems (symptoms) appear, and you are unable to meet with your dentist, call or return to this location. SEEK IMMEDIATE MEDICAL CARE IF:   You have a fever.  You develop redness and swelling of your face, jaw, or neck.  You are unable to open your mouth.  You have severe pain uncontrolled by pain medicine. MAKE SURE YOU:   Understand  these instructions.  Will watch your condition.  Will get help right away if you are not doing well or get worse. Document Released: 05/13/2005 Document Revised: 08/05/2011 Document Reviewed: 12/30/2007 [facility] Patient Information 2015 [place], [place]. This information is not intended to replace advice given to you by your health care provider. Make sure you discuss any questions you have with your health care provider.  Impacted Molar Molars are the teeth in the back of your mouth. You have 12 molars. There are 6 molars in each jaw, 3 on each side. When they grow in (erupt) they sometimes cause problems. Molars trapped inside the gum are impacted molars. Impacted molars may grow sideways, tilted, or may only partially emerge. Molars erupt at different times in life. The first set of molars usually erupts around 58 to 23 years of age. The second set of molars typically erupts around 19 to 24 years of age. The third set of molars are called wisdom teeth. These molars usually do not have enough space to erupt properly. Many teens and young adults develop impacted wisdom teeth and have them surgically removed (extracted). However, any molar or set of molars may become impacted. CAUSES  Teeth that are crowded are often the reason for an impacted molar, but sometimes a cyst or tumor may cause impaction of molars. SYMPTOMS  Sometimes there are no symptoms and an impacted molar is noticed during an exam or X-ray. If there are symptoms they may include:  Pain.  Swelling, redness, or inflammation near the impacted tooth or teeth.  Stiff jaw.  General feeling of illness.  Bad breath.  Gap between the teeth.  Difficulty opening your mouth.  Headache or jaw ache.  Swollen lymph nodes. Impacted teeth may increase the risk of complications such as:  Infection, with possible drainage around the infected area.  Damage to nearby teeth.  Growth of cysts.  Chronic discomfort. DIAGNOSIS    Impacted molars are diagnosed by oral exam and X-rays. TREATMENT  The goal of treatment is to obtain the best possible arrangement of your teeth. Your dentist or orthodontist will recommend the best course of action for you. After an exam, your caregiver may recommend one or a combination of the following treatments.  Supportive home care to manage pain and other symptoms until treatment can be started.  Surgical extraction of one or a combination of molars to leave room for emerging or later molars. Teeth must be extracted at appropriate times for the best results.  Surgical uncovering of tissue covering the impacted molar.  Orthodontic repositioning with the use of appliances such as elastic or metal separators, braces, wires, springs, and other removable or fixed devices. This is done to guide the molar and surrounding teeth to grow in properly. In some cases, you may need some surgery to assist this procedure. Follow-up orthodontic treatment is often necessary with impacted first and second molars.  Antibiotics to treat infection. HOME CARE INSTRUCTIONS Rinse as directed with an antibacterial solution or salt and warm water. Follow up with your caregiver as directed, even if you do not have symptoms. If you are waiting for treatment and have pain:  Take pain medicines as directed.  Take your antibiotics as directed. Finish them even if you start to feel better.  Put ice on the affected area.  Put ice in a plastic bag.  Place a towel between your skin and the bag.  Leave the ice on for 15-20 minutes, 03-04 times a day. SEEK DENTAL CARE IF:  You have a fever.  Pain emerges, worsens, or is not controlled by the medicines you were given.  Swelling occurs.  You have difficulty opening your mouth or swallowing. MAKE SURE YOU:   Understand these instructions.  Will watch your condition.  Will get help right away if you are not doing well or get worse. Document Released:  01/09/2011 Document Revised: 08/05/2011 Document Reviewed: 01/09/2011 [facility] Patient Information 2015 [place], [place]. This information is not intended to replace advice given to you by your health care provider. Make sure you discuss any questions you have with your health care provider.  Smoking Cessation, Tips for Success If you are ready to quit smoking, congratulations! You have chosen to help yourself be healthier. Cigarettes bring nicotine, tar, carbon monoxide, and other irritants into your body. Your lungs, heart, and blood vessels will be able to work better without these poisons. There are many different ways to quit smoking. Nicotine gum, nicotine patches, a nicotine inhaler, or nicotine nasal spray can help with physical craving. Hypnosis, support groups, and medicines help break the habit of smoking. WHAT THINGS CAN I DO TO MAKE QUITTING EASIER?  Here are some tips to help you quit for good:  Pick a date when you will quit smoking completely. Tell all of your friends and family about your plan to quit on that date.  Do not try to slowly cut down on the number of cigarettes you are smoking. Pick a quit date and quit smoking completely starting on that day.  Throw away all  cigarettes.   Clean and remove all ashtrays from your home, work, and car.  On a card, write down your reasons for quitting. Carry the card with you and read it when you get the urge to smoke.  Cleanse your body of nicotine. Drink enough water and fluids to keep your urine clear or pale yellow. Do this after quitting to flush the nicotine from your body.  Learn to predict your moods. Do not let a bad situation be your excuse to have a cigarette. Some situations in your life might tempt you into wanting a cigarette.  Never have "just one" cigarette. It leads to wanting another and another. Remind yourself of your decision to quit.  Change habits associated with smoking. If you smoked while driving or when  feeling stressed, try other activities to replace smoking. Stand up when drinking your coffee. Brush your teeth after eating. Sit in a different chair when you read the paper. Avoid alcohol while trying to quit, and try to drink fewer caffeinated beverages. Alcohol and caffeine may urge you to smoke.  Avoid foods and drinks that can trigger a desire to smoke, such as sugary or spicy foods and alcohol.  Ask people who smoke not to smoke around you.  Have something planned to do right after eating or having a cup of coffee. For example, plan to take a walk or exercise.  Try a relaxation exercise to calm you down and decrease your stress. Remember, you may be tense and nervous for the first 2 weeks after you quit, but this will pass.  Find new activities to keep your hands busy. Play with a pen, coin, or rubber band. Doodle or draw things on paper.  Brush your teeth right after eating. This will help cut down on the craving for the taste of tobacco after meals. You can also try mouthwash.   Use oral substitutes in place of cigarettes. Try using lemon drops, carrots, cinnamon sticks, or chewing gum. Keep them handy so they are available when you have the urge to smoke.  When you have the urge to smoke, try deep breathing.  Designate your home as a nonsmoking area.  If you are a heavy smoker, ask your health care provider about a prescription for nicotine chewing gum. It can ease your withdrawal from nicotine.  Reward yourself. Set aside the cigarette money you save and buy yourself something nice.  Look for support from others. Join a support group or smoking cessation program. Ask someone at home or at work to help you with your plan to quit smoking.  Always ask yourself, "Do I need this cigarette or is this just a reflex?" Tell yourself, "Today, I choose not to smoke," or "I do not want to smoke." You are reminding yourself of your decision to quit.  Do not replace cigarette smoking with  electronic cigarettes (commonly called e-cigarettes). The safety of e-cigarettes is unknown, and some may contain harmful chemicals.  If you relapse, do not give up! Plan ahead and think about what you will do the next time you get the urge to smoke. HOW WILL I FEEL WHEN I QUIT SMOKING? You may have symptoms of withdrawal because your body is used to nicotine (the addictive substance in cigarettes). You may crave cigarettes, be irritable, feel very hungry, cough often, get headaches, or have difficulty concentrating. The withdrawal symptoms are only temporary. They are strongest when you first quit but will go away within 10-14 days. When withdrawal symptoms occur,  stay in control. Think about your reasons for quitting. Remind yourself that these are signs that your body is healing and getting used to being without cigarettes. Remember that withdrawal symptoms are easier to treat than the major diseases that smoking can cause.  Even after the withdrawal is over, expect periodic urges to smoke. However, these cravings are generally short lived and will go away whether you smoke or not. Do not smoke! WHAT RESOURCES ARE AVAILABLE TO HELP ME QUIT SMOKING? Your health care provider can direct you to community resources or hospitals for support, which may include:  Group support.  Education.  Hypnosis.  Therapy. Document Released: 02/09/2004 Document Revised: 09/27/2013 Document Reviewed: 10/29/2012 [facility] Patient Information 2015 [place], [place]. This information is not intended to replace advice given to you by your health care provider. Make sure you discuss any questions you have with your health care provider.   Emergency Department Resource Guide 1) Find a Doctor and Pay Out of Pocket Although you won't have to find out who is covered by your insurance plan, it is a good idea to ask around and get recommendations. You will then need to call the office and see if the doctor you have chosen  will accept you as a new patient and what types of options they offer for patients who are self-pay. Some doctors offer discounts or will set up payment plans for their patients who do not have insurance, but you will need to ask so you aren't surprised when you get to your appointment.  2) Contact Your Local Health Department Not all health departments have doctors that can see patients for sick visits, but many do, so it is worth a call to see if yours does. If you don't know where your local health department is, you can check in your phone book. The CDC also has a tool to help you locate your state's health department, and many state websites also have listings of all of their local health departments.  3) Find a Walk-in Clinic If your illness is not likely to be very severe or complicated, you may want to try a walk in clinic. These are popping up all over the country in pharmacies, drugstores, and shopping centers. They're usually staffed by nurse practitioners or physician assistants that have been trained to treat common illnesses and complaints. They're usually fairly quick and inexpensive. However, if you have serious medical issues or chronic medical problems, these are probably not your best option.  No Primary Care Doctor: - Call Health Connect at  [phone] - they can help you locate a primary care doctor that  accepts your insurance, provides certain services, etc. - Physician Referral Service- [phone]  Chronic Pain Problems: Organization         Address  Phone   Notes  [facility]  [phone] Patients need to be referred by their primary care doctor.   Medication Assistance: Organization         Address  Phone   Notes  [facility] Medication [facility] [address] [place] [place], [place] [address] [phone] --Must be a resident of [place] -- Must have NO insurance coverage whatsoever (no Medicaid/ Medicare, etc.) --  The pt. MUST have a primary care doctor that directs their care regularly and follows them in the community   [facility]  [phone]   [facility]  [phone]     Dental Care: Organization  Address  Phone  Notes  [facility] [address], [place] [phone] Accepts children up to age 23 who are enrolled in [place] or [place] Health Choice; pregnant women with a Medicaid card; and children who have applied for Medicaid or [place] Health Choice, but were declined, whose parents can pay a reduced fee at time of service.  [facility] [place]  [address], [place] [phone] Accepts children up to age 23 who are enrolled in [place] or [place] Health Choice; pregnant women with a Medicaid card; and children who have applied for Medicaid or [place] Health Choice, but were declined, whose parents can pay a reduced fee at time of service.  [facility]  [address], [place] [phone] Patients are seen by appointment only. Walk-ins are not accepted. [facility] will see patients 23 years of age and older. Monday - Tuesday (8am-5pm) Most Wednesdays (8:30-5pm) $30 per visit, cash only  [facility]  [address], [place] [phone] Patients are seen by appointment only. Walk-ins are not accepted. [facility] will see patients 23 years of age and older. One Wednesday Evening (Monthly: Volunteer Based).  $30 per visit, cash only  [facility]  [phone] for adults; Children under age 444, call Graduate Pediatric Dentistry at [phone]. Children aged 254-14, please call [phone] to request a pediatric application.  Dental services are provided in all areas of dental care including fillings, crowns and bridges, complete and partial dentures, implants, gum treatment, root canals, and  extractions. Preventive care is also provided. Treatment is provided to both adults and children. Patients are selected via a lottery and there is often a waiting list.   [facility] [address], [place]  [phone] [email or web address]   [facility] [address], [place], [place] [phone], Ext. [phone] Second and Fourth Thursday of each month, opens at 6:30 AM; Clinic ends at 9 AM.  Patients are seen on a first-come first-served basis, and a limited number are seen during each clinic.   [facility]  [address], [place], [place] [phone]   Eligibility Requirements You must have lived in [place], [place], or [place] for at least the last three months.   You cannot be eligible for state or federal sponsored [place] insurance, including [employer], [place], or [employer]icare.   You generally cannot be eligible for healthcare insurance through your employer.    How to apply: Eligibility screenings are held every Tuesday and Wednesday afternoon from 1:00 pm until 4:00 pm. You do not need an appointment for the interview!  [facility] [address], [place], [place] [phone]   [facility]  [phone]   [facility]  [phone]   [facility]  [phone]

## 2014-12-08 NOTE — ED Notes (Addendum)
Pt A+Ox4, reports c/o L lower dental pain xweeks.  Pt denies fevers/chills.  Reports pain worse in AM.  Speaking full/clear sentences, rr even/un-lab.  Skin PWD.  Ambulatory with steady gait.  NAD.

## 2014-12-08 NOTE — ED Provider Notes (Signed)
CSN: [id number]     Arrival date & time 12/08/14  1755 History  This chart was scribed for non-physician provider Liborio Nixon- Soms, working with Eber Hong, MD by Phillis Haggis, ED Scribe. This patient was seen in room WTR9/WTR9 and patient care was started at 6:55 PM.   Chief Complaint  Patient presents with  . Dental Pain    xweeks, L lower   Patient is a 24 y.o. female presenting with tooth pain. The history is provided by the patient. No language interpreter was used.  Dental Pain Location:  Lower Lower teeth location:  18/LL 2nd molar and 17/LL 3rd molar Quality:  Throbbing Severity:  Moderate Onset quality:  Sudden Duration:  3 weeks Timing:  Constant Progression:  Worsening Chronicity:  New Context: poor dentition   Relieved by:  Nothing Worsened by:  Pressure Ineffective treatments:  NSAIDs Associated symptoms: gum swelling   Associated symptoms: no congestion, no difficulty swallowing, no drooling, no facial pain, no facial swelling, no fever, no headaches, no neck pain, no neck swelling, no oral bleeding, no oral lesions and no trismus   Risk factors: lack of dental care and smoking   Risk factors: no diabetes and no immunosuppression    HPI Comments: Eshika Reckart is a 24 y.o. female who presents to the Emergency Department complaining of left lower dental pain onset 3 weeks ago. Pt states that the pain is currently 6/10 but will be 8/10 when she wakes up, constant, throbbing, and non-radiating. Pain is worsened with opening and closing mouth and chewing. Pt states that she has taken ibuprofen and Excedrin to no relief. Pt states that she has a wisdom tooth coming in, but believes it may be infected and has some gum swelling. She denies fever, chills, sore throat, trouble swallowing, trismus, drooling, facial/neck  swelling, gum bleeding/drainage, numbness, weakness, tingling, nausea, vomiting, diarrhea, abdominal pain, CP, SOB, or cough. Pt reports that she is a  smoker. Pt denies DM. Pt denies having insurance or a regular PCP. Pt reports allergies to Penicillin and Robitussin.    She also reports irregular menses and has had neg Upreg and [facility]   Past Medical History  Diagnosis Date  . Asthma   . Polycystic ovarian disease   . GERD (gastroesophageal reflux disease)    Past Surgical History  Procedure Laterality Date  . Tonsillectomy     Family History  Problem Relation Age of Onset  . Hypertension Mother    History  Substance Use Topics  . Smoking status: Current Every Day Smoker -- 1.00 packs/day    Types: Cigarettes  . Smokeless tobacco: Never Used  . Alcohol Use: Yes     Comment: rarely   OB History    No data available     Review of Systems  Constitutional: Negative for fever and chills.  HENT: Positive for dental problem. Negative for congestion, drooling, ear discharge, ear pain, facial swelling, mouth sores, sore throat and trouble swallowing.   Eyes: Negative for visual disturbance.  Respiratory: Negative for cough and shortness of breath.   Cardiovascular: Negative for chest pain.  Gastrointestinal: Negative for nausea, vomiting, abdominal pain and diarrhea.  Genitourinary: Positive for menstrual problem (irregular). Negative for dysuria and hematuria.  Musculoskeletal: Negative for arthralgias and neck pain.  Skin: Negative for color change.  Allergic/Immunologic: Negative for immunocompromised state.  Neurological: Negative for dizziness, weakness, light-headedness, numbness and headaches.  Psychiatric/Behavioral: Negative for confusion.   10 Systems reviewed and all are negative  for acute change except as noted in the HPI.  Allergies  Penicillins; Shrimp; and Robitussin  Home Medications   Prior to Admission medications   Medication Sig Start Date End Date Taking? Authorizing Provider  albuterol (PROVENTIL HFA;VENTOLIN HFA) 108 (90 BASE) MCG/ACT inhaler Inhale 1 puff into the lungs every 6  (six) hours as needed for wheezing or shortness of breath (emergency asthma).    Historical Provider, MD  Aspirin-Salicylamide-Caffeine (BC HEADACHE POWDER PO) Take 1 packet by mouth daily as needed (Headache). For headache    Historical Provider, MD  cetirizine (ZYRTEC) 5 MG tablet Take 5 mg by mouth daily as needed for allergies.    Historical Provider, MD   BP 127/79 mmHg  Pulse 87  Temp(Src) 98.3 F (36.8 C) (Oral)  Resp 16  Ht 5\' 5"  (1.651 m)  Wt 293 lb (132.904 kg)  BMI 48.76 kg/m2  SpO2 99%  LMP 10/23/2014 (Approximate)  Physical Exam  Constitutional: She is oriented to person, place, and time. Vital signs are normal. She appears well-developed and well-nourished.  Non-toxic appearance. No distress.  Afebrile, nontoxic, NAD  HENT:  Head: Normocephalic and atraumatic.  Nose: Nose normal.  Mouth/Throat: Uvula is midline, oropharynx is clear and moist and mucous membranes are normal. No trismus in the jaw. Dental caries present. No dental abscesses or uvula swelling.    RL molar #17 impacted with mild gingival erythema without abscess, decayed tooth. No drainage. No facial swelling. Nose clear. Oropharynx clear and moist, without uvular swelling or deviation, no trismus or drooling, no tonsillar swelling or erythema, no exudates.    Eyes: Conjunctivae and EOM are normal. Right eye exhibits no discharge. Left eye exhibits no discharge.  Neck: Normal range of motion. Neck supple.  Cardiovascular: Normal rate.   Pulmonary/Chest: Effort normal. No respiratory distress.  Abdominal: Normal appearance. She exhibits no distension.  Musculoskeletal: Normal range of motion.  Lymphadenopathy:       Head (right side): No submandibular and no tonsillar adenopathy present.       Head (left side): No submandibular and no tonsillar adenopathy present.    She has no cervical adenopathy.  No head/neck LAD  Neurological: She is alert and oriented to person, place, and time. She has normal  strength. No sensory deficit.  Skin: Skin is warm, dry and intact. No rash noted.  Psychiatric: She has a normal mood and affect. Her behavior is normal.  Nursing note and vitals reviewed.   ED Course  Procedures (including critical care time) DIAGNOSTIC STUDIES: Oxygen Saturation is 99% on RA, normal by my interpretation.    COORDINATION OF CARE: 7:00 PM-Discussed treatment plan which includes pain medication, anti-biotics, follow up with [facility] and dentist with pt at bedside and pt agreed to plan.   Labs Review Labs Reviewed - No data to display  Imaging Review No results found.   EKG Interpretation None      MDM   Final diagnoses:  Chronic dental pain  Impacted third molar tooth  Tobacco abuse    24 y.o. female here with Dental pain associated with dental impaction and possible dental infection with patient afebrile, non toxic appearing and swallowing secretions well. I gave patient referral to dentist and stressed the importance of dental follow up for ultimate management of dental pain.  I have also discussed reasons to return immediately to the ER.  Patient expresses understanding and agrees with plan. Pt with irregular menses, [facility], but given her uncertainty  will avoid doxy, and pt allergic to PCN therefore will give clindamycin. Will give pain control. Will have her f/up with [facility] to establish care and for ongoing management of her PCOS and irreg menses. Tobacco cessation discussed   I personally performed the services described in this documentation, which was scribed in my presence. The recorded information has been reviewed and is accurate.  BP 127/79 mmHg  Pulse 87  Temp(Src) 98.3 F (36.8 C) (Oral)  Resp 16  Ht 5\' 5"  (1.651 m)  Wt 293 lb (132.904 kg)  BMI 48.76 kg/m2  SpO2 99%  LMP 10/23/2014 (Approximate)  Meds ordered this encounter  Medications  . HYDROcodone-acetaminophen (NORCO) 5-325 MG per tablet    Sig: Take 1  tablet by mouth every 6 (six) hours as needed for severe pain.    Dispense:  6 tablet    Refill:  0    Order Specific Question:  Supervising Provider    Answer:  MILLER, BRIAN [[id number]]  . naproxen (NAPROSYN) 500 MG tablet    Sig: Take 1 tablet (500 mg total) by mouth 2 (two) times daily as needed for mild pain, moderate pain or headache (TAKE WITH MEALS.).    Dispense:  20 tablet    Refill:  0    Order Specific Question:  Supervising Provider    Answer:  MILLER, BRIAN [[id number]]  . clindamycin (CLEOCIN) 300 MG capsule    Sig: Take 1 capsule (300 mg total) by mouth 4 (four) times daily. X 7 days    Dispense:  28 capsule    Refill:  0    Order Specific Question:  Supervising Provider    Answer:  Eber HongMILLER, BRIAN [[id number]]     Rawad Bochicchio Camprubi-Soms, PA-C 12/08/14 1921  Eber HongBrian Miller, MD 12/08/14 [phone]

## 2014-12-11 ENCOUNTER — Encounter (HOSPITAL_COMMUNITY): Payer: Self-pay | Admitting: Advanced Practice Midwife

## 2014-12-11 ENCOUNTER — Inpatient Hospital Stay (HOSPITAL_COMMUNITY)
Admission: AD | Admit: 2014-12-11 | Discharge: 2014-12-11 | Disposition: A | Discharge status: Home / Self Care | Payer: Self-pay | Source: Ambulatory Visit | Attending: Obstetrics and Gynecology | Admitting: Obstetrics and Gynecology

## 2014-12-11 DIAGNOSIS — N6452 Nipple discharge: Secondary | ICD-10-CM | POA: Insufficient documentation

## 2014-12-11 DIAGNOSIS — F17210 Nicotine dependence, cigarettes, uncomplicated: Secondary | ICD-10-CM | POA: Insufficient documentation

## 2014-12-11 DIAGNOSIS — N926 Irregular menstruation, unspecified: Secondary | ICD-10-CM | POA: Insufficient documentation

## 2014-12-11 LAB — URINALYSIS, ROUTINE W REFLEX MICROSCOPIC
Bilirubin Urine: NEGATIVE
Glucose, UA: NEGATIVE mg/dL
Ketones, ur: NEGATIVE mg/dL
LEUKOCYTES UA: NEGATIVE
Nitrite: NEGATIVE
Protein, ur: NEGATIVE mg/dL
Specific Gravity, Urine: >1.03 — ABNORMAL HIGH (ref 1.005–1.030)
Urobilinogen, UA: 0.2 mg/dL (ref 0.0–1.0)
pH: 5.5 (ref 5.0–8.0)

## 2014-12-11 LAB — URINE MICROSCOPIC-ADD ON

## 2014-12-11 LAB — POCT PREGNANCY, URINE: Preg Test, Ur: NEGATIVE

## 2014-12-11 NOTE — MAU Note (Signed)
Pt presents complaining of a clear discharge from her nipples after squeezing. States she feels like she's having flutters but was told she was not pregnant in May. Irregular periods due to PCOS. Denies pain. Denies bleeding or abnormal discharge.

## 2014-12-11 NOTE — MAU Note (Signed)
Pt states has every single pregnancy symptom. Denies any issues with vaginal discharge. LMP-12/09/2014

## 2014-12-11 NOTE — MAU Provider Note (Signed)
History     CSN: [id number]  Arrival date and time: 12/11/14 1711   None     Chief Complaint  Patient presents with  . Breast Discharge   HPI  24 y.o. G0P0000 w/ bilateral, clear nonspontaneous nipple discharge since yesterday, no other breast changes, no masses. Patient's last menstrual period was 12/09/2014 (exact date). She states she has irregular menses d/t PCOS. She states she thought she could be pregnant because she was feeling "flutters" in her lower abdomen. She had a negative pregnancy test and normal pelvic u/s 2 months ago.   Past Medical History  Diagnosis Date  . Asthma   . Polycystic ovarian disease   . GERD (gastroesophageal reflux disease)     Past Surgical History  Procedure Laterality Date  . Tonsillectomy      Family History  Problem Relation Age of Onset  . Hypertension Mother     History  Substance Use Topics  . Smoking status: Current Every Day Smoker -- 1.00 packs/day    Types: Cigarettes  . Smokeless tobacco: Never Used  . Alcohol Use: Yes     Comment: rarely    Allergies:  Allergies  Allergen Reactions  . Penicillins Anaphylaxis and Hives  . Shrimp [Shellfish Allergy] Anaphylaxis, Hives, Itching and Swelling  . Robitussin [Guaifenesin] Hives    Prescriptions prior to admission  Medication Sig Dispense Refill Last Dose  . albuterol (PROVENTIL HFA;VENTOLIN HFA) 108 (90 BASE) MCG/ACT inhaler Inhale 1 puff into the lungs every 6 (six) hours as needed for wheezing or shortness of breath (emergency asthma).   unknown  . Aspirin-Salicylamide-Caffeine (BC HEADACHE POWDER PO) Take 1 packet by mouth daily as needed (Headache). For headache   12/08/2014 at Unknown time  . clindamycin (CLEOCIN) 300 MG capsule Take 1 capsule (300 mg total) by mouth 4 (four) times daily. X 7 days 28 capsule 0   . HYDROcodone-acetaminophen (NORCO) 5-325 MG per tablet Take 1 tablet by mouth every 6 (six) hours as needed for severe pain. 6 tablet 0   . naproxen  (NAPROSYN) 500 MG tablet Take 1 tablet (500 mg total) by mouth 2 (two) times daily as needed for mild pain, moderate pain or headache (TAKE WITH MEALS.). 20 tablet 0     Review of Systems  Constitutional: Negative.   Respiratory: Negative.   Cardiovascular: Negative.   Gastrointestinal: Negative for nausea, vomiting, abdominal pain, diarrhea and constipation.  Genitourinary: Negative for dysuria, urgency, frequency, hematuria and flank pain.       Negative for vaginal bleeding, vaginal discharge  Musculoskeletal: Negative.   Neurological: Negative.   Psychiatric/Behavioral: Negative.    Physical Exam   Last menstrual period 12/09/2014.  Physical Exam  Nursing note and vitals reviewed. Constitutional: She is oriented to person, place, and time. She appears well-developed and well-nourished. No distress.  Obese   Cardiovascular: Normal rate.   Respiratory: Effort normal.  Genitourinary: There is breast discharge (tiny droplet of clear nipple discharge w/ manual expression only). No breast swelling, tenderness or bleeding.  Musculoskeletal: Normal range of motion.  Neurological: She is alert and oriented to person, place, and time.  Skin: Skin is warm and dry.  Psychiatric: She has a normal mood and affect.    MAU Course  Procedures Results for orders placed or performed during the hospital encounter of 12/11/14 (from the past 24 hour(s))  Urinalysis, Routine w reflex microscopic (not at [facility])     Status: Abnormal   Collection Time: 12/11/14  5:19 PM  Result Value Ref Range   Color, Urine YELLOW YELLOW   APPearance CLEAR CLEAR   Specific Gravity, Urine >1.030 (H) 1.005 - 1.030   pH 5.5 5.0 - 8.0   Glucose, UA NEGATIVE NEGATIVE mg/dL   Hgb urine dipstick LARGE (A) NEGATIVE   Bilirubin Urine NEGATIVE NEGATIVE   Ketones, ur NEGATIVE NEGATIVE mg/dL   Protein, ur NEGATIVE NEGATIVE mg/dL   Urobilinogen, UA 0.2 0.0 - 1.0 mg/dL   Nitrite NEGATIVE NEGATIVE   Leukocytes, UA  NEGATIVE NEGATIVE  Urine microscopic-add on     Status: Abnormal   Collection Time: 12/11/14  5:19 PM  Result Value Ref Range   Squamous Epithelial / LPF FEW (A) RARE   WBC, UA 0-2 <3 WBC/hpf   RBC / HPF 0-2 <3 RBC/hpf   Bacteria, UA RARE RARE   Urine-Other MUCOUS PRESENT   Pregnancy, urine POC     Status: None   Collection Time: 12/11/14  5:29 PM  Result Value Ref Range   Preg Test, Ur NEGATIVE NEGATIVE      Assessment and Plan   1. Bilateral nipple discharge   Nonspontaneous, new onset, normal breast exam. Advised to limit breast stimulation, monitor symptoms, f/u outpatient, provided list of OB/GYNs - offered appointment in GYN clinic, pt states she will have new insurance soon and would rather wait to choose provider and schedule after that time.     Medication List    STOP taking these medications        albuterol 108 (90 BASE) MCG/ACT inhaler  Commonly known as:  PROVENTIL HFA;VENTOLIN HFA      TAKE these medications        BC HEADACHE POWDER PO  Take 1 packet by mouth as needed (for headaches). For headache     clindamycin 300 MG capsule  Commonly known as:  CLEOCIN  Take 1 capsule (300 mg total) by mouth 4 (four) times daily. X 7 days     HYDROcodone-acetaminophen 5-325 MG per tablet  Commonly known as:  NORCO  Take 1 tablet by mouth every 6 (six) hours as needed for severe pain.     naproxen 500 MG tablet  Commonly known as:  NAPROSYN  Take 1 tablet (500 mg total) by mouth 2 (two) times daily as needed for mild pain, moderate pain or headache (TAKE WITH MEALS.).            Follow-up Information    Schedule an appointment as soon as possible for a visit with Provider of your choice - see list.        Dontavian Marchi 12/11/2014, 5:30 PM

## 2015-07-18 ENCOUNTER — Encounter (HOSPITAL_COMMUNITY): Payer: Self-pay

## 2015-07-18 ENCOUNTER — Emergency Department (HOSPITAL_COMMUNITY)
Admission: EM | Admit: 2015-07-18 | Discharge: 2015-07-18 | Disposition: A | Discharge status: Home / Self Care | Payer: Self-pay | Attending: Emergency Medicine | Admitting: Emergency Medicine

## 2015-07-18 DIAGNOSIS — Z880 Allergy status to penicillin: Secondary | ICD-10-CM | POA: Insufficient documentation

## 2015-07-18 DIAGNOSIS — Z8719 Personal history of other diseases of the digestive system: Secondary | ICD-10-CM | POA: Insufficient documentation

## 2015-07-18 DIAGNOSIS — J45909 Unspecified asthma, uncomplicated: Secondary | ICD-10-CM | POA: Insufficient documentation

## 2015-07-18 DIAGNOSIS — Z202 Contact with and (suspected) exposure to infections with a predominantly sexual mode of transmission: Secondary | ICD-10-CM | POA: Insufficient documentation

## 2015-07-18 DIAGNOSIS — F17210 Nicotine dependence, cigarettes, uncomplicated: Secondary | ICD-10-CM | POA: Insufficient documentation

## 2015-07-18 DIAGNOSIS — Z8639 Personal history of other endocrine, nutritional and metabolic disease: Secondary | ICD-10-CM | POA: Insufficient documentation

## 2015-07-18 LAB — WET PREP, GENITAL
Clue Cells Wet Prep HPF POC: NONE SEEN
SPERM: NONE SEEN
Trich, Wet Prep: NONE SEEN
YEAST WET PREP: NONE SEEN

## 2015-07-18 MED ORDER — LIDOCAINE HCL (PF) 1 % IJ SOLN
INTRAMUSCULAR | Status: AC
  Filled 2015-07-18: qty 5

## 2015-07-18 MED ORDER — CEFTRIAXONE SODIUM 250 MG IJ SOLR
250.0000 mg | Freq: Once | INTRAMUSCULAR | Status: AC
  Administered 2015-07-18: 250 mg via INTRAMUSCULAR
  Filled 2015-07-18: qty 250

## 2015-07-18 MED ORDER — AZITHROMYCIN 250 MG PO TABS
1000.0000 mg | ORAL_TABLET | Freq: Once | ORAL | Status: AC
  Administered 2015-07-18: 1000 mg via ORAL
  Filled 2015-07-18: qty 4

## 2015-07-18 NOTE — ED Notes (Signed)
Pt reports her boyfriend told her he has chlamydia.  Pt wants to be checked.  Denies any symptoms.

## 2015-07-18 NOTE — ED Provider Notes (Signed)
CSN: [id number]     Arrival date & time 07/18/15  1602 History   First MD Initiated Contact with Patient 07/18/15 1612     Chief Complaint  Patient presents with  . S74.5      (Consider location/radiation/quality/duration/timing/severity/associated sxs/prior Treatment) Patient is a 25 y.o. female presenting with STD exposure. The history is provided by the patient. No language interpreter was used.  Exposure to STD This is a new problem. The current episode started today. The problem occurs constantly. The problem has been unchanged. Associated symptoms include abdominal pain. Nothing aggravates the symptoms. She has tried nothing for the symptoms. The treatment provided moderate relief.  Pt reports her partner was diagnosed with chlamydia.  Pt recheck check and treatment. No symptoms  Past Medical History  Diagnosis Date  . Asthma   . Polycystic ovarian disease   . GERD (gastroesophageal reflux disease)    Past Surgical History  Procedure Laterality Date  . Tonsillectomy     Family History  Problem Relation Age of Onset  . Hypertension Mother    Social History  Substance Use Topics  . Smoking status: Current Every Day Smoker -- 1.00 packs/day    Types: Cigarettes  . Smokeless tobacco: Never Used  . Alcohol Use: Yes     Comment: rarely   OB History    Gravida Para Term Preterm AB TAB SAB Ectopic Multiple Living       Review of Systems  Gastrointestinal: Positive for abdominal pain.  All other systems reviewed and are negative.     Allergies  Penicillins; Shrimp; and Robitussin  Home Medications   Prior to Admission medications   Medication Sig Start Date End Date Taking? Authorizing Provider  Aspirin-Salicylamide-Caffeine (BC HEADACHE POWDER PO) Take 1 packet by mouth as needed (for headaches). For headache    Historical Provider, MD  clindamycin (CLEOCIN) 300 MG capsule Take 1 capsule (300 mg total) by mouth 4 (four) times daily. X 7  days Patient not taking: Reported on 12/11/2014 12/08/14   Mercedes Camprubi-Soms, PA-C  HYDROcodone-acetaminophen (NORCO) 5-325 MG per tablet Take 1 tablet by mouth every 6 (six) hours as needed for severe pain. 12/08/14   Mercedes Camprubi-Soms, PA-C  naproxen (NAPROSYN) 500 MG tablet Take 1 tablet (500 mg total) by mouth 2 (two) times daily as needed for mild pain, moderate pain or headache (TAKE WITH MEALS.). Patient taking differently: Take 500 mg by mouth 2 (two) times daily as needed for mild pain or headache.  12/08/14   Mercedes Camprubi-Soms, PA-C   BP 108/81 mmHg  Pulse 72  Temp(Src) 98.3 F (36.8 C) (Oral)  Resp 16  Ht  (1.651 m)  Wt 129.275 kg  BMI 47.43 kg/m2  SpO2 100%  LMP 06/22/2015 Physical Exam  Constitutional: She is oriented to person, place, and time. She appears well-developed and well-nourished.  HENT:  Head: Normocephalic and atraumatic.  Eyes: Conjunctivae and EOM are normal. Pupils are equal, round, and reactive to light.  Neck: Normal range of motion.  Cardiovascular: Normal rate.   Pulmonary/Chest: Effort normal.  Abdominal: She exhibits no distension.  Genitourinary: Vagina normal and uterus normal. No vaginal discharge found.  Musculoskeletal: Normal range of motion.  Neurological: She is alert and oriented to person, place, and time.  Psychiatric: She has a normal mood and affect.  Nursing note and vitals reviewed.   ED Course  Procedures (including critical care time) Labs Review Labs Reviewed  WET PREP, GENITAL  GC/CHLAMYDIA PROBE AMP ([place]) NOT AT [facility]    Imaging Review No results found. I have personally reviewed and evaluated these images and lab results as part of my medical decision-making.   EKG Interpretation None      MDM Pt counseled on safe sex   Final diagnoses:  Exposure to STD    Meds ordered this encounter  Medications  . cefTRIAXone (ROCEPHIN) injection 250 mg    Sig:     Order Specific Question:   Antibiotic Indication:    Answer:  STD  . azithromycin (ZITHROMAX) tablet 1,000 mg    Sig:   . lidocaine (PF) (XYLOCAINE) 1 % injection    Sig:     Unk Lightning  : cabinet override    An After Visit Summary was printed and given to the patient.  Lonia Skinner [place], PA-C 07/18/15 1733  Glynn Octave, MD 07/18/15 [phone]

## 2015-07-18 NOTE — Discharge Instructions (Signed)
Safe Sex  Safe sex is about reducing the risk of giving or getting a sexually transmitted disease (STD). STDs are spread through sexual contact involving the genitals, mouth, or rectum. Some STDs can be cured and others cannot. Safe sex can also prevent unintended pregnancies.   WHAT ARE SOME SAFE SEX PRACTICES?  · Limit your sexual activity to only one partner who is having sex with only you.  · Talk to your partner about his or her past partners, past STDs, and drug use.  · Use a condom every time you have sexual intercourse. This includes vaginal, oral, and anal sexual activity. Both females and males should wear condoms during oral sex. Only use latex or polyurethane condoms and water-based lubricants. Using petroleum-based lubricants or oils to lubricate a condom will weaken the condom and increase the chance that it will break. The condom should be in place from the beginning to the end of sexual activity. Wearing a condom reduces, but does not completely eliminate, your risk of getting or giving an STD. STDs can be spread by contact with infected body fluids and skin.  · Get vaccinated for hepatitis B and HPV.  · Avoid alcohol and recreational drugs, which can affect your judgment. You may forget to use a condom or participate in high-risk sex.  · For females, avoid douching after sexual intercourse. Douching can spread an infection farther into the reproductive tract.  · Check your body for signs of sores, blisters, rashes, or unusual discharge. See your health care provider if you notice any of these signs.  · Avoid sexual contact if you have symptoms of an infection or are being treated for an STD. If you or your partner has herpes, avoid sexual contact when blisters are present. Use condoms at all other times.  · If you are at risk of being infected with HIV, it is recommended that you take a prescription medicine daily to prevent HIV infection. This is called pre-exposure prophylaxis (PrEP). You are  considered at risk if:    You are a man who has sex with other men (MSM).    You are a heterosexual man or woman who is sexually active with more than one partner.    You take drugs by injection.    You are sexually active with a partner who has HIV.  · Talk with your health care provider about whether you are at high risk of being infected with HIV. If you choose to begin PrEP, you should first be tested for HIV. You should then be tested every 3 months for as long as you are taking PrEP.  · See your health care provider for regular screenings, exams, and tests for other STDs. Before having sex with a new partner, each of you should be screened for STDs and should talk about the results with each other.  WHAT ARE THE BENEFITS OF SAFE SEX?   · There is less chance of getting or giving an STD.  · You can prevent unwanted or unintended pregnancies.  · By discussing safe sex concerns with your partner, you may increase feelings of intimacy, comfort, trust, and honesty between the two of you.     This information is not intended to replace advice given to you by your health care provider. Make sure you discuss any questions you have with your health care provider.     Document Released: 06/20/2004 Document Revised: 06/03/2014 Document Reviewed: 11/04/2011  Elsevier Interactive Patient Education ©2016 [employer]

## 2015-07-19 LAB — GC/CHLAMYDIA PROBE AMP (~~LOC~~) NOT AT ARMC
Chlamydia: POSITIVE — AB
Neisseria Gonorrhea: NEGATIVE

## 2015-07-20 ENCOUNTER — Telehealth (HOSPITAL_BASED_OUTPATIENT_CLINIC_OR_DEPARTMENT_OTHER): Payer: Self-pay | Admitting: Emergency Medicine

## 2015-08-06 ENCOUNTER — Emergency Department (HOSPITAL_COMMUNITY)
Admission: EM | Admit: 2015-08-06 | Discharge: 2015-08-06 | Disposition: A | Discharge status: Home / Self Care | Payer: Self-pay | Attending: Emergency Medicine | Admitting: Emergency Medicine

## 2015-08-06 ENCOUNTER — Encounter (HOSPITAL_COMMUNITY): Payer: Self-pay | Admitting: Emergency Medicine

## 2015-08-06 DIAGNOSIS — F17210 Nicotine dependence, cigarettes, uncomplicated: Secondary | ICD-10-CM | POA: Insufficient documentation

## 2015-08-06 DIAGNOSIS — R51 Headache: Secondary | ICD-10-CM | POA: Insufficient documentation

## 2015-08-06 DIAGNOSIS — J45909 Unspecified asthma, uncomplicated: Secondary | ICD-10-CM | POA: Insufficient documentation

## 2015-08-06 DIAGNOSIS — R519 Headache, unspecified: Secondary | ICD-10-CM

## 2015-08-06 MED ORDER — KETOROLAC TROMETHAMINE 30 MG/ML IJ SOLN
30.0000 mg | Freq: Once | INTRAMUSCULAR | Status: AC
  Administered 2015-08-06: 30 mg via INTRAVENOUS
  Filled 2015-08-06: qty 1

## 2015-08-06 MED ORDER — SODIUM CHLORIDE 0.9 % IV SOLN
1000.0000 mL | Freq: Once | INTRAVENOUS | Status: AC
  Administered 2015-08-06: 1000 mL via INTRAVENOUS

## 2015-08-06 MED ORDER — DEXAMETHASONE SODIUM PHOSPHATE 10 MG/ML IJ SOLN
10.0000 mg | Freq: Once | INTRAMUSCULAR | Status: AC
  Administered 2015-08-06: 10 mg via INTRAVENOUS
  Filled 2015-08-06: qty 1

## 2015-08-06 MED ORDER — DIPHENHYDRAMINE HCL 50 MG/ML IJ SOLN
25.0000 mg | Freq: Once | INTRAMUSCULAR | Status: AC
  Administered 2015-08-06: 25 mg via INTRAVENOUS
  Filled 2015-08-06: qty 1

## 2015-08-06 MED ORDER — PROCHLORPERAZINE EDISYLATE 5 MG/ML IJ SOLN
10.0000 mg | Freq: Once | INTRAMUSCULAR | Status: AC
  Administered 2015-08-06: 10 mg via INTRAVENOUS
  Filled 2015-08-06: qty 2

## 2015-08-06 NOTE — ED Notes (Signed)
Sharley Hunnicutt RN entered room to d/c patient and pt was not in room.  Attempted to contact via phone to verify IV removal without success.

## 2015-08-06 NOTE — ED Notes (Signed)
Pt states her headache is much better.

## 2015-08-06 NOTE — ED Provider Notes (Signed)
CSN: [id number]     Arrival date & time 08/06/15  1038 History  By signing my name below, I, Tasha Hughes, attest that this documentation has been prepared under the direction and in the presence of Tasha Memos, MD. Electronically Signed: Gonzella Hughes, Scribe. 08/06/2015. 11:39 AM.   Chief Complaint  Patient presents with  . Headache   The history is provided by the patient. No language interpreter was used.   HPI Comments: Tasha Hughes is a 25 y.o. female, with a hx of daily HA's, who presents to the Emergency Department complaining of sudden onset, gradually worsening, throbbing HA, with radiation into her neck, which began three or four days ago but worsened last night. Pt also reports associated nausea, which resolved last night, as well as photophobia and sensitivity to sound. She has tried taking 2 BC powders, last taken about two hours ago, with little relief. She also tried taking Mucinex and Alka seltzer with no relief. Pt states that she has experienced a similar HA in the past secondary to a sinus infection and a 24 hour cold symptoms. Her LNMP ended two days ago. Pt denies vomiting, rash, fever, numbness, weakness, difficulty chewing, nasal drainage, and hx of brain cancer.   Past Medical History  Diagnosis Date  . Asthma   . Polycystic ovarian disease   . GERD (gastroesophageal reflux disease)    Past Surgical History  Procedure Laterality Date  . Tonsillectomy     Family History  Problem Relation Age of Onset  . Hypertension Mother    Social History  Substance Use Topics  . Smoking status: Current Every Day Smoker -- 1.00 packs/day for 6 years    Types: Cigarettes  . Smokeless tobacco: Never Used  . Alcohol Use: Yes     Comment: rarely   OB History    Gravida Para Term Preterm AB TAB SAB Ectopic Multiple Living       Review of Systems  Constitutional: Negative for fever.  HENT: Negative for postnasal drip.   Eyes: Positive  for photophobia.  Gastrointestinal: Positive for nausea. Negative for vomiting.  Musculoskeletal: Positive for neck pain.  Skin: Negative for rash.  Neurological: Positive for headaches. Negative for weakness and numbness.  All other systems reviewed and are negative.  Allergies  Penicillins; Shrimp; and Robitussin  Home Medications   Prior to Admission medications   Medication Sig Start Date End Date Taking? Authorizing Provider  Aspirin-Salicylamide-Caffeine (BC HEADACHE POWDER PO) Take 1 packet by mouth as needed (for headaches). For headache   Yes Historical Provider, MD   BP 137/69 mmHg  Pulse 96  Temp(Src) 98.7 F (37.1 C) (Oral)  Resp 18  Ht  (1.651 m)  Wt 285 lb (129.275 kg)  BMI 47.43 kg/m2  SpO2 98%  LMP 08/05/2015 Physical Exam  Constitutional: She is oriented to person, place, and time. She appears well-developed and well-nourished. No distress.  HENT:  Head: Normocephalic and atraumatic.  No sinus tenderness No rhinorrhea or swelling in the nose No erythema in back of the throat  Eyes: Conjunctivae are normal.  Cardiovascular: Normal rate.   Pulmonary/Chest: Effort normal.  Abdominal: She exhibits no distension.  Neurological: She is alert and oriented to person, place, and time.  Cranial Nerves are intact Finger to nose normal Distal motor and sensation intact Symmetric in patella and biceps  Skin: Skin is warm and dry.  Psychiatric: She has a normal mood  and affect.  Nursing note and vitals reviewed.  ED Course  Procedures  DIAGNOSTIC STUDIES:    Oxygen Saturation is 98% on RA, normal by my interpretation.   COORDINATION OF CARE:  11:29 AM Will administer pain medication. Discussed treatment plan with pt at bedside and pt agreed to plan.   Labs Review Labs Reviewed - No data to display   I have personally reviewed and evaluated these lab results as part of my medical decision-making.  MDM   Final diagnoses:  Nonintractable headache,  unspecified chronicity pattern, unspecified headache type    The patient arrived with signs and symptoms consistent with a migraine headache. The patient has history of migraines. This feels like previous migraines.The patient has a reassuring neuro exam lessening chances of intracranial abnormalities. The patient was given decadron, Compazine, Toradol, Benadryl with patient's pain significantly improved and is ready for discharge. The patient remained in no acute distress, hemodynamically stable with reassuring neuro exam. The patient was then discharged from the Emergency Department with no further acute issues. Recommend follow up with neurologist or PCP within the next week.   I personally performed the services described in this documentation, which was scribed in my presence. The recorded information has been reviewed and is accurate.      Tasha MemosJason Myesha Stillion, MD 08/08/15 1057

## 2015-08-06 NOTE — ED Notes (Signed)
Patient c/o headache that radiates into neck x3 days. Per patient had some nausea but denies any now. Denies any vomiting. Per patient sensitivity to light and sound. Per patient has tried multiple medications with no relief. Per patient took 2 BC powders last at 9:20 am with little relief.

## 2015-12-08 ENCOUNTER — Encounter (HOSPITAL_COMMUNITY): Payer: Self-pay | Admitting: Emergency Medicine

## 2015-12-08 ENCOUNTER — Emergency Department (HOSPITAL_COMMUNITY)
Admission: EM | Admit: 2015-12-08 | Discharge: 2015-12-08 | Disposition: A | Discharge status: Home / Self Care | Payer: Self-pay | Attending: Emergency Medicine | Admitting: Emergency Medicine

## 2015-12-08 DIAGNOSIS — R1030 Lower abdominal pain, unspecified: Secondary | ICD-10-CM | POA: Insufficient documentation

## 2015-12-08 DIAGNOSIS — J45909 Unspecified asthma, uncomplicated: Secondary | ICD-10-CM | POA: Insufficient documentation

## 2015-12-08 DIAGNOSIS — Z5321 Procedure and treatment not carried out due to patient leaving prior to being seen by health care provider: Secondary | ICD-10-CM | POA: Insufficient documentation

## 2015-12-08 DIAGNOSIS — F17210 Nicotine dependence, cigarettes, uncomplicated: Secondary | ICD-10-CM | POA: Insufficient documentation

## 2015-12-08 NOTE — ED Notes (Signed)
No answer when called to patient room 

## 2015-12-08 NOTE — ED Notes (Addendum)
Patient complaining of lower abdominal pain starting this morning. Denies vomiting or diarrhea. Denies dysuria.

## 2015-12-08 NOTE — ED Notes (Signed)
Patient not in waiting area when called to patient room

## 2015-12-09 ENCOUNTER — Encounter (HOSPITAL_COMMUNITY): Payer: Self-pay | Admitting: *Deleted

## 2015-12-09 ENCOUNTER — Emergency Department (HOSPITAL_COMMUNITY)
Admission: EM | Admit: 2015-12-09 | Discharge: 2015-12-09 | Disposition: A | Discharge status: Home / Self Care | Payer: Self-pay | Attending: Emergency Medicine | Admitting: Emergency Medicine

## 2015-12-09 ENCOUNTER — Other Ambulatory Visit (HOSPITAL_COMMUNITY): Payer: Self-pay | Admitting: Emergency Medicine

## 2015-12-09 ENCOUNTER — Other Ambulatory Visit (HOSPITAL_COMMUNITY): Payer: Self-pay

## 2015-12-09 DIAGNOSIS — R1030 Lower abdominal pain, unspecified: Secondary | ICD-10-CM

## 2015-12-09 DIAGNOSIS — R109 Unspecified abdominal pain: Secondary | ICD-10-CM

## 2015-12-09 DIAGNOSIS — Z791 Long term (current) use of non-steroidal anti-inflammatories (NSAID): Secondary | ICD-10-CM | POA: Insufficient documentation

## 2015-12-09 DIAGNOSIS — Z7982 Long term (current) use of aspirin: Secondary | ICD-10-CM | POA: Insufficient documentation

## 2015-12-09 DIAGNOSIS — Z8742 Personal history of other diseases of the female genital tract: Secondary | ICD-10-CM

## 2015-12-09 DIAGNOSIS — R1032 Left lower quadrant pain: Secondary | ICD-10-CM | POA: Insufficient documentation

## 2015-12-09 DIAGNOSIS — F17210 Nicotine dependence, cigarettes, uncomplicated: Secondary | ICD-10-CM | POA: Insufficient documentation

## 2015-12-09 DIAGNOSIS — J45909 Unspecified asthma, uncomplicated: Secondary | ICD-10-CM | POA: Insufficient documentation

## 2015-12-09 DIAGNOSIS — R3915 Urgency of urination: Secondary | ICD-10-CM | POA: Insufficient documentation

## 2015-12-09 DIAGNOSIS — Z79899 Other long term (current) drug therapy: Secondary | ICD-10-CM | POA: Insufficient documentation

## 2015-12-09 LAB — CBC WITH DIFFERENTIAL/PLATELET
Basophils Absolute: 0 10*3/uL (ref 0.0–0.1)
Basophils Relative: 0 %
EOS ABS: 0.2 10*3/uL (ref 0.0–0.7)
EOS PCT: 2 %
HCT: 40.6 % (ref 36.0–46.0)
Hemoglobin: 13.3 g/dL (ref 12.0–15.0)
LYMPHS ABS: 2.6 10*3/uL (ref 0.7–4.0)
LYMPHS PCT: 21 %
MCH: 28 pg (ref 26.0–34.0)
MCHC: 32.8 g/dL (ref 30.0–36.0)
MCV: 85.5 fL (ref 78.0–100.0)
MONOS PCT: 6 %
Monocytes Absolute: 0.7 10*3/uL (ref 0.1–1.0)
Neutro Abs: 9 10*3/uL — ABNORMAL HIGH (ref 1.7–7.7)
Neutrophils Relative %: 71 %
PLATELETS: 294 10*3/uL (ref 150–400)
RBC: 4.75 MIL/uL (ref 3.87–5.11)
RDW: 14.7 % (ref 11.5–15.5)
WBC: 12.6 10*3/uL — AB (ref 4.0–10.5)

## 2015-12-09 LAB — COMPREHENSIVE METABOLIC PANEL
ALK PHOS: 64 U/L (ref 38–126)
ALT: 23 U/L (ref 14–54)
ANION GAP: 6 (ref 5–15)
AST: 19 U/L (ref 15–41)
Albumin: 3.9 g/dL (ref 3.5–5.0)
BUN: 17 mg/dL (ref 6–20)
CALCIUM: 8.6 mg/dL — AB (ref 8.9–10.3)
CO2: 24 mmol/L (ref 22–32)
CREATININE: 0.6 mg/dL (ref 0.44–1.00)
Chloride: 107 mmol/L (ref 101–111)
Glucose, Bld: 94 mg/dL (ref 65–99)
Potassium: 2.9 mmol/L — ABNORMAL LOW (ref 3.5–5.1)
SODIUM: 137 mmol/L (ref 135–145)
Total Bilirubin: 0.3 mg/dL (ref 0.3–1.2)
Total Protein: 7.5 g/dL (ref 6.5–8.1)

## 2015-12-09 LAB — URINALYSIS, ROUTINE W REFLEX MICROSCOPIC
BILIRUBIN URINE: NEGATIVE
Glucose, UA: NEGATIVE mg/dL
HGB URINE DIPSTICK: NEGATIVE
KETONES UR: NEGATIVE mg/dL
NITRITE: NEGATIVE
PROTEIN: NEGATIVE mg/dL
Specific Gravity, Urine: 1.01 (ref 1.005–1.030)
pH: 6 (ref 5.0–8.0)

## 2015-12-09 LAB — URINE MICROSCOPIC-ADD ON

## 2015-12-09 LAB — LIPASE, BLOOD: LIPASE: 14 U/L (ref 11–51)

## 2015-12-09 LAB — PREGNANCY, URINE: PREG TEST UR: NEGATIVE

## 2015-12-09 MED ORDER — FENTANYL CITRATE (PF) 100 MCG/2ML IJ SOLN
100.0000 ug | Freq: Once | INTRAMUSCULAR | Status: AC
  Administered 2015-12-09: 100 ug via INTRAVENOUS
  Filled 2015-12-09: qty 2

## 2015-12-09 MED ORDER — KETOROLAC TROMETHAMINE 30 MG/ML IJ SOLN
30.0000 mg | Freq: Once | INTRAMUSCULAR | Status: AC
  Administered 2015-12-09: 30 mg via INTRAVENOUS
  Filled 2015-12-09: qty 1

## 2015-12-09 MED ORDER — SODIUM CHLORIDE 0.9 % IV BOLUS (SEPSIS)
1000.0000 mL | Freq: Once | INTRAVENOUS | Status: AC
  Administered 2015-12-09: 1000 mL via INTRAVENOUS

## 2015-12-09 MED ORDER — IBUPROFEN 800 MG PO TABS: 800.0000 mg | ORAL_TABLET | Freq: Three times a day (TID) | ORAL | Status: DC

## 2015-12-09 MED ORDER — CYCLOBENZAPRINE HCL 10 MG PO TABS: 10.0000 mg | ORAL_TABLET | Freq: Two times a day (BID) | ORAL | Status: DC | PRN

## 2015-12-09 MED ORDER — DICYCLOMINE HCL 10 MG/ML IM SOLN
20.0000 mg | Freq: Once | INTRAMUSCULAR | Status: AC
  Administered 2015-12-09: 20 mg via INTRAMUSCULAR
  Filled 2015-12-09: qty 2

## 2015-12-09 MED ORDER — POTASSIUM CHLORIDE CRYS ER 20 MEQ PO TBCR
40.0000 meq | EXTENDED_RELEASE_TABLET | Freq: Once | ORAL | Status: AC
  Administered 2015-12-09: 40 meq via ORAL
  Filled 2015-12-09: qty 2

## 2015-12-09 NOTE — ED Notes (Signed)
Pt c/o a "pulling" feeling in her lower abdomen since yesterday morning. Pt denies n/v/d or urinary symptoms.

## 2015-12-09 NOTE — ED Provider Notes (Signed)
CSN: [id number]     Arrival date & time 12/09/15  0134 History   First MD Initiated Contact with Patient 12/09/15 0147     Chief Complaint  Patient presents with  . Abdominal Pain     (Consider location/radiation/quality/duration/timing/severity/associated sxs/prior Treatment) Patient is a 25 y.o. female presenting with abdominal pain.  Abdominal Pain Pain location:  Suprapubic, LLQ and RLQ Pain quality: aching and cramping   Pain radiates to:  Does not radiate Pain severity:  Mild Onset quality:  Gradual Duration:  2 days Timing:  Constant Chronicity:  New Relieved by:  None tried Worsened by:  Nothing tried Ineffective treatments:  None tried   Past Medical History  Diagnosis Date  . Asthma   . Polycystic ovarian disease   . GERD (gastroesophageal reflux disease)    Past Surgical History  Procedure Laterality Date  . Tonsillectomy     Family History  Problem Relation Age of Onset  . Hypertension Mother    Social History  Substance Use Topics  . Smoking status: Current Every Day Smoker -- 1.00 packs/day for 6 years    Types: Cigarettes  . Smokeless tobacco: Never Used  . Alcohol Use: Yes     Comment: rarely   OB History    Gravida Para Term Preterm AB TAB SAB Ectopic Multiple Living       Review of Systems  Gastrointestinal: Positive for abdominal pain.  Genitourinary: Positive for urgency, flank pain and decreased urine volume.  All other systems reviewed and are negative.     Allergies  Penicillins; Shrimp; and Robitussin  Home Medications   Prior to Admission medications   Medication Sig Start Date End Date Taking? Authorizing Provider  Aspirin-Salicylamide-Caffeine (BC HEADACHE POWDER PO) Take 1 packet by mouth as needed (for headaches). For headache    Historical Provider, MD  cyclobenzaprine (FLEXERIL) 10 MG tablet Take 1 tablet (10 mg total) by mouth 2 (two) times daily as needed for muscle spasms. 12/09/15   Marily Memos,  MD  ibuprofen (ADVIL,MOTRIN) 800 MG tablet Take 1 tablet (800 mg total) by mouth 3 (three) times daily. 12/09/15   Marily Memos, MD   BP 103/88 mmHg  Pulse 97  Temp(Src) 98.1 F (36.7 C) (Oral)  Resp 18  Ht  (1.575 m)  Wt 285 lb (129.275 kg)  BMI 52.11 kg/m2  SpO2 99%  LMP 12/03/2015 Physical Exam  Constitutional: She is oriented to person, place, and time. She appears well-developed and well-nourished.  HENT:  Head: Normocephalic and atraumatic.  Neck: Normal range of motion.  Cardiovascular: Normal rate and regular rhythm.   Pulmonary/Chest: Effort normal and breath sounds normal. No stridor. No respiratory distress. She has no rales.  Abdominal: Soft. She exhibits no distension. There is tenderness (suprapubic). There is no rebound and no guarding.  Musculoskeletal: Normal range of motion. She exhibits no edema or tenderness.  Neurological: She is alert and oriented to person, place, and time.  Skin: Skin is warm and dry.  Nursing note and vitals reviewed.   ED Course  Procedures (including critical care time) Labs Review Labs Reviewed  CBC WITH DIFFERENTIAL/PLATELET - Abnormal; Notable for the following:    WBC 12.6 (*)    Neutro Abs 9.0 (*)    All other components within normal limits  COMPREHENSIVE METABOLIC PANEL - Abnormal; Notable for the following:    Potassium 2.9 (*)    Calcium 8.6 (*)  All other components within normal limits  URINALYSIS, ROUTINE W REFLEX MICROSCOPIC ([facility]) - Abnormal; Notable for the following:    Leukocytes, UA TRACE (*)    All other components within normal limits  URINE MICROSCOPIC-ADD ON - Abnormal; Notable for the following:    Squamous Epithelial / LPF 0-5 (*)    Bacteria, UA RARE (*)    All other components within normal limits  LIPASE, BLOOD  PREGNANCY, URINE    Imaging Review No results found. I have personally reviewed and evaluated these images and lab results as part of my medical decision-making.   EKG  Interpretation None      MDM   Final diagnoses:  Abdominal pain, unspecified abdominal location   Suprapubic pain radiating bilaterally. Some urinary symptoms.  abdomen overall benign. Mild ttp suprapubically, no cva ttp.  Likely UTI vs IBS. Low suspicion for significant abdominal pathology, will check labs, treat pain.  Labs unremarkable. Possible IBS vs muscle strain. Will get US in AM to ensure no torsion, ovarian cyst or other abnormalities, otherwise will obtain PCP follow up.   New Prescriptions: Discharge Medication List as of 12/09/2015  4:28 AM    START taking these medications   Details  cyclobenzaprine (FLEXERIL) 10 MG tablet Take 1 tablet (10 mg total) by mouth 2 (two) times daily as needed for muscle spasms., Starting 12/09/2015, Until Discontinued, Print    ibuprofen (ADVIL,MOTRIN) 800 MG tablet Take 1 tablet (800 mg total) by mouth 3 (three) times daily., Starting 12/09/2015, Until Discontinued, Print         I have personally and contemperaneously reviewed labs and imaging and used in my decision making as above.   A medical screening exam was performed and I feel the patient has had an appropriate workup for their chief complaint at this time and likelihood of emergent condition existing is low and thus workup can continue on an outpatient basis.. Their vital signs are stable. They have been counseled on decision, discharge, follow up and which symptoms necessitate immediate return to the emergency department.  They verbally stated understanding and agreement with plan and discharged in stable condition.      Marily MemosJason Jenniffer Vessels, MD 12/09/15 [phone]

## 2015-12-10 ENCOUNTER — Inpatient Hospital Stay (HOSPITAL_COMMUNITY): Admit: 2015-12-10 | Payer: Self-pay

## 2015-12-10 ENCOUNTER — Ambulatory Visit (HOSPITAL_COMMUNITY)
Admit: 2015-12-10 | Discharge: 2015-12-10 | Disposition: A | Discharge status: Home / Self Care | Payer: Self-pay | Attending: Emergency Medicine | Admitting: Emergency Medicine

## 2015-12-10 DIAGNOSIS — R1030 Lower abdominal pain, unspecified: Secondary | ICD-10-CM | POA: Insufficient documentation

## 2015-12-10 DIAGNOSIS — Z8742 Personal history of other diseases of the female genital tract: Secondary | ICD-10-CM | POA: Insufficient documentation

## 2015-12-12 IMAGING — US US PELVIS COMPLETE
1 series · 13 of 25 positions shown · non-contrast
Comparison: None

CLINICAL DATA: Intermittent pelvic pain for approximately 9 months.

EXAM:
TRANSABDOMINAL AND TRANSVAGINAL ULTRASOUND OF PELVIS
TECHNIQUE: Both transabdominal and transvaginal ultrasound examinations of the
pelvis were performed. Transabdominal technique was performed for
global imaging of the pelvis including uterus, ovaries, adnexal
regions, and pelvic cul-de-sac. It was necessary to proceed with
endovaginal exam following the transabdominal exam to visualize the
endometrium and ovaries..

[Series 1: us pelvis complete · 13 of 83 slices shown]
[im 1/83]
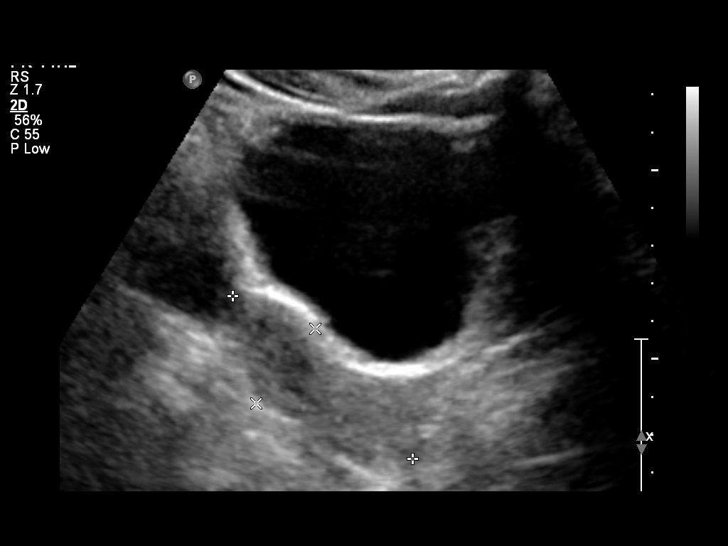
[im 7/83]
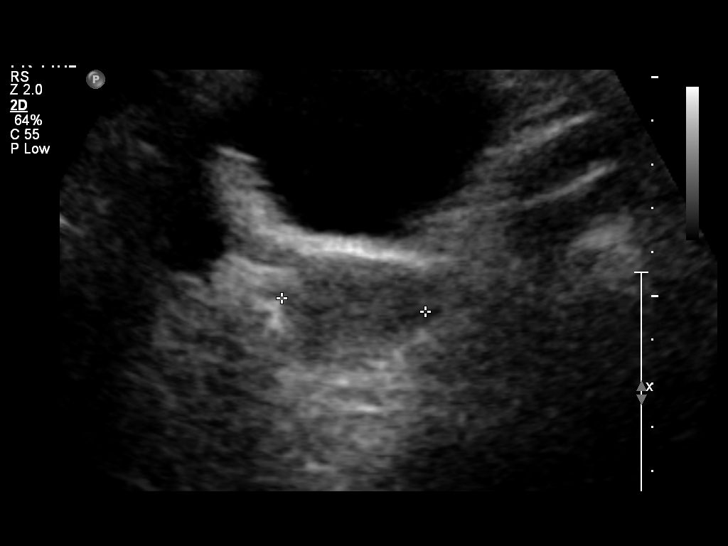
[im 14/83]
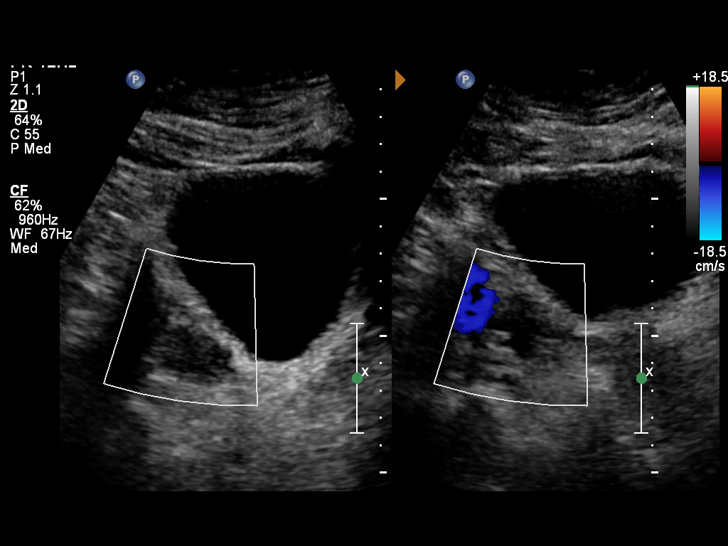
[im 21/83]
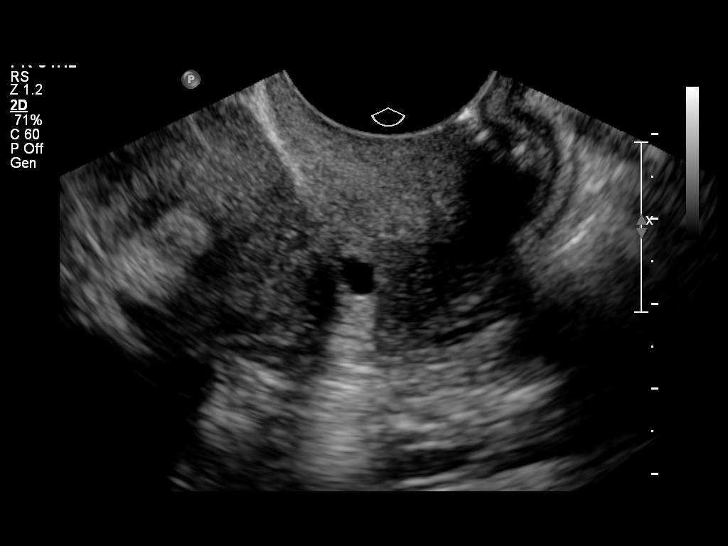
[im 28/83]
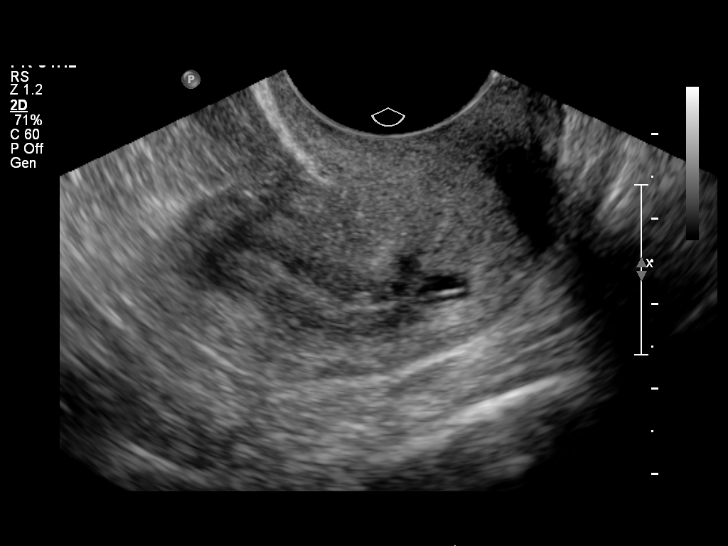
[im 35/83]
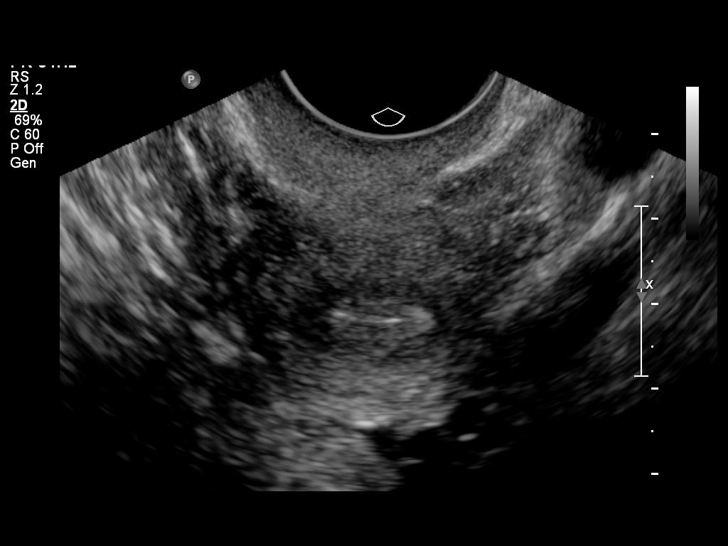
[im 42/83]
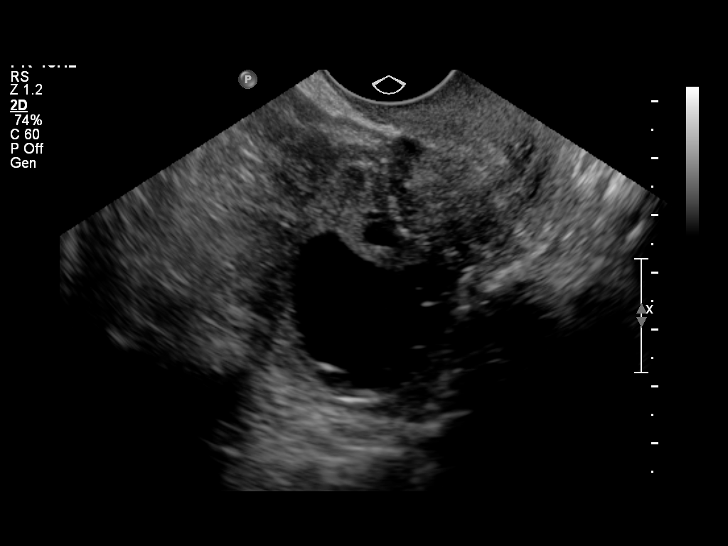
[im 48/83]
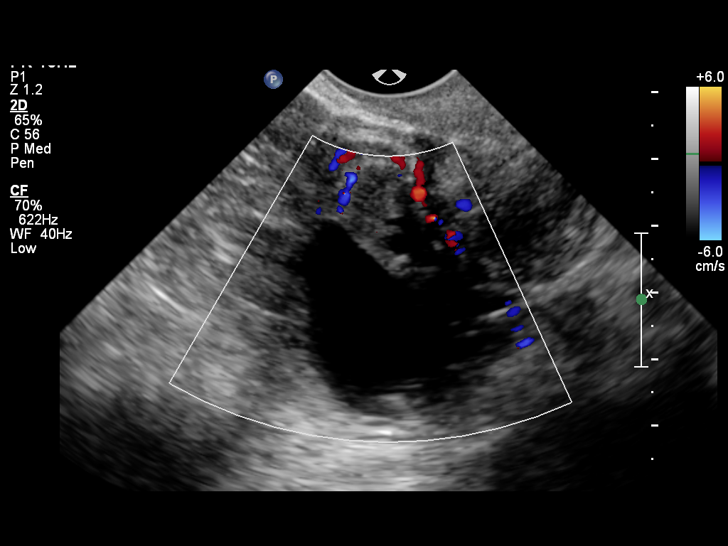
[im 55/83]
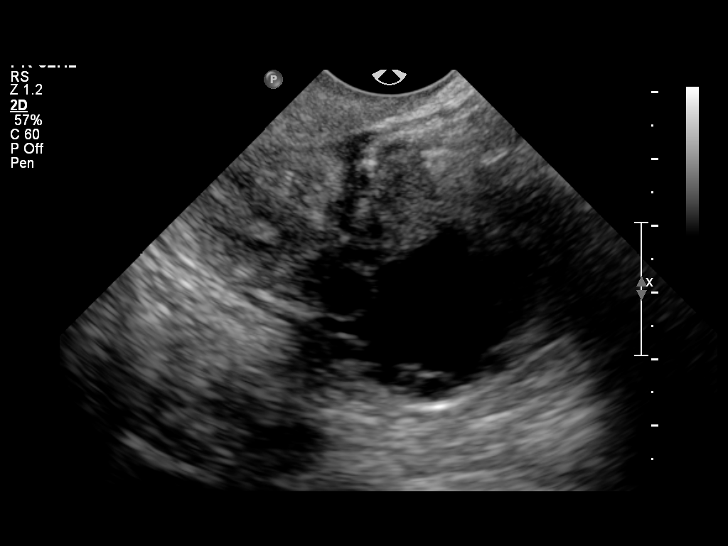
[im 62/83]
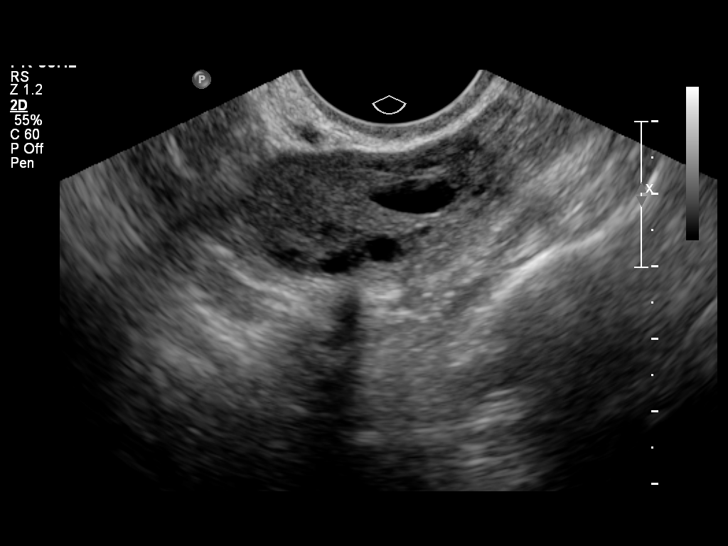
[im 69/83]
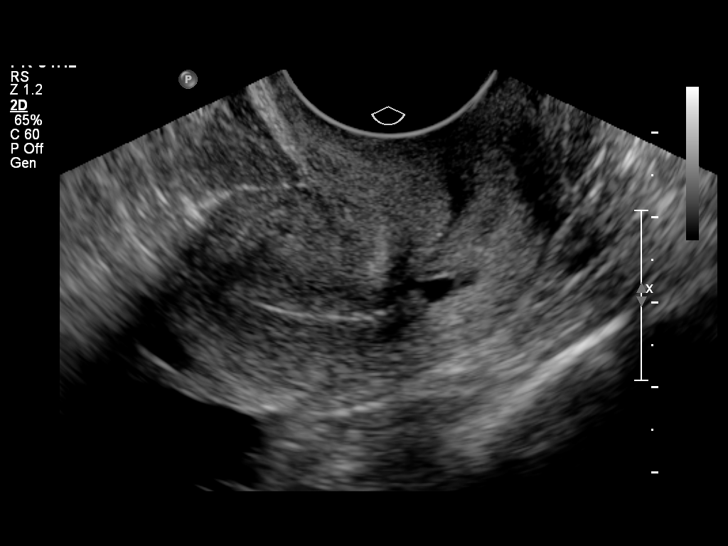
[im 76/83]
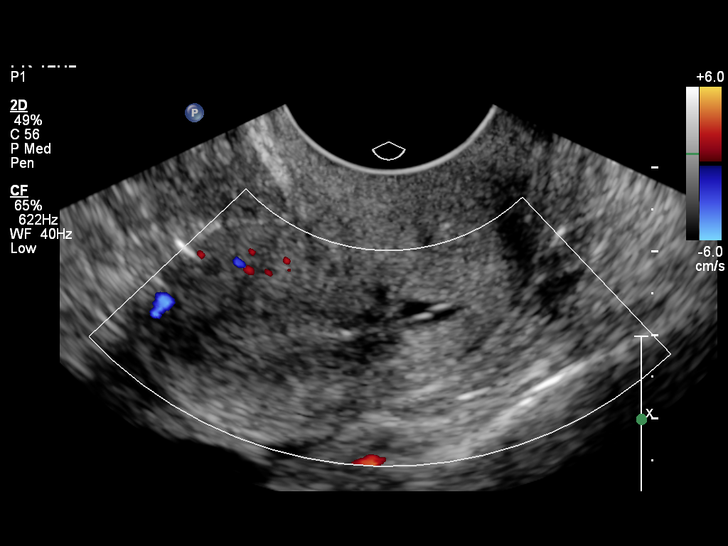
[im 83/83]
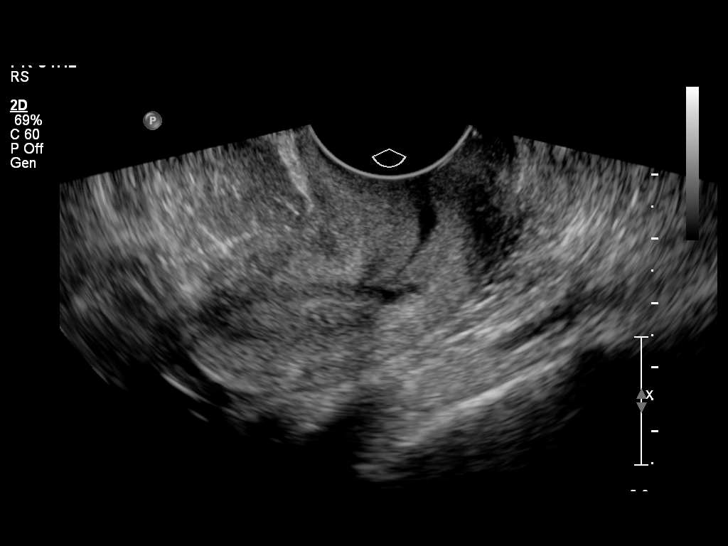

[13 of 25 positions shown; findings below may reference images not displayed]

FINDINGS: Uterus

Measurements: 6 x 2.6 x 3.0 cm. No fibroids or other mass
visualized.

Endometrium

Thickness: 5.3 mm..  No focal abnormality visualized.

Right ovary

Measurements: 3.9 x 1.8 x 3.9 cm. There are numerous small follicles
identified along the periphery of the right ovary. No mass noted.

Left ovary

Measurements: 4.6 x 3.1 x 3.7 cm.. Multiple follicles are identified
along the periphery of the left ovary. There is a dominant follicle/
cyst which measures 3.1 cm.

Other findings

Trace free fluid noted.
IMPRESSION: 1. No acute findings.  No explanation for patient's pain.
2. Dominant cyst in left ovary measures 3.1 cm. This is almost
certainly benign, and no specific imaging follow up is recommended
according to the Society of Radiologists in 8ltrasoundASHS Consensus
Conference Statement (Sumeye Hamade et al. Management of Asymptomatic
Ovarian and Other Adnexal Cysts Imaged at US: Society of
Radiologists in Ultrasound Consensus Conference Statement 9343.
Radiology [DATE]): [id number].).

## 2016-06-24 ENCOUNTER — Emergency Department (HOSPITAL_COMMUNITY)
Admission: EM | Admit: 2016-06-24 | Discharge: 2016-06-24 | Disposition: A | Discharge status: Home / Self Care | Payer: BLUE CROSS/BLUE SHIELD | Attending: Emergency Medicine | Admitting: Emergency Medicine

## 2016-06-24 ENCOUNTER — Encounter (HOSPITAL_COMMUNITY): Payer: Self-pay | Admitting: Emergency Medicine

## 2016-06-24 DIAGNOSIS — J45909 Unspecified asthma, uncomplicated: Secondary | ICD-10-CM | POA: Insufficient documentation

## 2016-06-24 DIAGNOSIS — Z79899 Other long term (current) drug therapy: Secondary | ICD-10-CM | POA: Diagnosis not present

## 2016-06-24 DIAGNOSIS — K219 Gastro-esophageal reflux disease without esophagitis: Secondary | ICD-10-CM | POA: Diagnosis not present

## 2016-06-24 DIAGNOSIS — Z87891 Personal history of nicotine dependence: Secondary | ICD-10-CM | POA: Insufficient documentation

## 2016-06-24 DIAGNOSIS — R1031 Right lower quadrant pain: Secondary | ICD-10-CM | POA: Diagnosis present

## 2016-06-24 DIAGNOSIS — R1030 Lower abdominal pain, unspecified: Secondary | ICD-10-CM

## 2016-06-24 LAB — URINALYSIS, ROUTINE W REFLEX MICROSCOPIC
BILIRUBIN URINE: NEGATIVE
GLUCOSE, UA: NEGATIVE mg/dL
HGB URINE DIPSTICK: NEGATIVE
Ketones, ur: NEGATIVE mg/dL
Leukocytes, UA: NEGATIVE
Nitrite: NEGATIVE
PH: 6 (ref 5.0–8.0)
Protein, ur: NEGATIVE mg/dL
SPECIFIC GRAVITY, URINE: 1.02 (ref 1.005–1.030)

## 2016-06-24 LAB — CBC
HEMATOCRIT: 41.4 % (ref 36.0–46.0)
HEMOGLOBIN: 13.4 g/dL (ref 12.0–15.0)
MCH: 27.4 pg (ref 26.0–34.0)
MCHC: 32.4 g/dL (ref 30.0–36.0)
MCV: 84.7 fL (ref 78.0–100.0)
Platelets: 288 10*3/uL (ref 150–400)
RBC: 4.89 MIL/uL (ref 3.87–5.11)
RDW: 13.3 % (ref 11.5–15.5)
WBC: 6.8 10*3/uL (ref 4.0–10.5)

## 2016-06-24 LAB — COMPREHENSIVE METABOLIC PANEL
ALBUMIN: 3.4 g/dL — AB (ref 3.5–5.0)
ALK PHOS: 64 U/L (ref 38–126)
ALT: 21 U/L (ref 14–54)
ANION GAP: 7 (ref 5–15)
AST: 18 U/L (ref 15–41)
BUN: 19 mg/dL (ref 6–20)
CALCIUM: 8.9 mg/dL (ref 8.9–10.3)
CHLORIDE: 102 mmol/L (ref 101–111)
CO2: 28 mmol/L (ref 22–32)
Creatinine, Ser: 0.62 mg/dL (ref 0.44–1.00)
GFR calc Af Amer: >60 mL/min (ref >60)
GFR calc non Af Amer: >60 mL/min (ref >60)
Glucose, Bld: 102 mg/dL — ABNORMAL HIGH (ref 65–99)
POTASSIUM: 4.3 mmol/L (ref 3.5–5.1)
SODIUM: 137 mmol/L (ref 135–145)
Total Bilirubin: 0.3 mg/dL (ref 0.3–1.2)
Total Protein: 6.8 g/dL (ref 6.5–8.1)

## 2016-06-24 LAB — PREGNANCY, URINE: Preg Test, Ur: NEGATIVE

## 2016-06-24 LAB — LIPASE, BLOOD: Lipase: 13 U/L (ref 11–51)

## 2016-06-24 MED ORDER — ONDANSETRON HCL 4 MG PO TABS: 4.0000 mg | ORAL_TABLET | Freq: Once | ORAL | Status: DC

## 2016-06-24 NOTE — ED Provider Notes (Signed)
[facility]-EMERGENCY DEPT Provider Note   CSN: [id number] Arrival date & time: 06/24/16  4098     History   Chief Complaint Chief Complaint  Patient presents with  . Abdominal Pain    HPI Tasha Hughes is a 26 y.o. female.  HPI The patient is a 26 year old African-American female with a past medical history of asthma, GERD and polycystic ovarian syndrome who presents with abdominal pain that started this morning. The patient's abdominal pain is bilaterally in her lower quadrants and is associated with diarrhea. It does not radiate. She is also having nausea but no emesis. She denies fevers or chills. She denies chest pain or shortness of breath. This pain came on this morning and has resolved following 3-4 episodes of diarrhea. The patient states that she had several alcoholic beverages yesterday evening and woke up this morning with this pain and diarrhea.  Additionally, the patient states that over the previous several weeks she has felt bloated and that her abdomen is larger. She also notes that her breasts have   increased in size and are tender to touch. Her last menstrual period was December 5th. She did have some spotting last week. She is not on birth control. She is sexually active. She has no additional acute complaints or concerns today. Past Medical History:  Diagnosis Date  . Asthma   . GERD (gastroesophageal reflux disease)   . Polycystic ovarian disease     There are no active problems to display for this patient.   Past Surgical History:  Procedure Laterality Date  . TONSILLECTOMY      OB History    Gravida Para Term Preterm AB Living   0 0 0 0 0 0   SAB TAB Ectopic Multiple Live Births   0 0 0 0         Home Medications    Prior to Admission medications   Medication Sig Start Date End Date Taking? Authorizing Provider  Aspirin-Salicylamide-Caffeine (BC HEADACHE POWDER PO) Take 1 packet by mouth as needed (for headaches). For headache    Historical  Provider, MD  cyclobenzaprine (FLEXERIL) 10 MG tablet Take 1 tablet (10 mg total) by mouth 2 (two) times daily as needed for muscle spasms. 12/09/15   Marily Memos, MD  ibuprofen (ADVIL,MOTRIN) 800 MG tablet Take 1 tablet (800 mg total) by mouth 3 (three) times daily. 12/09/15   Marily Memos, MD    Family History Family History  Problem Relation Age of Onset  . Hypertension Mother     Social History Social History  Substance Use Topics  . Smoking status: Former Smoker    Packs/day: 1.00    Years: 6.00    Types: Cigarettes    Quit date: 05/28/2016  . Smokeless tobacco: Never Used  . Alcohol use Yes     Comment: rarely     Allergies   Penicillins; Shrimp [shellfish allergy]; Robitussin [guaifenesin]; and Sudafed [pseudoephedrine hcl]   Review of Systems Review of Systems  All other systems reviewed and are negative.    Physical Exam Updated Vital Signs BP 122/76 (BP Location: Right Arm)   Pulse 82   Temp 97.7 F (36.5 C)   Resp 18   Ht 5\' 5"  (1.651 m)   Wt 129.3 kg   LMP 04/30/2016   SpO2 97%   BMI 47.43 kg/m   Physical Exam  Constitutional: She is oriented to person, place, and time. She appears well-developed and well-nourished.  Obese  HENT:  Head: Normocephalic and atraumatic.  Cardiovascular: Normal rate and regular rhythm.  Exam reveals no friction rub.   No murmur heard. Pulmonary/Chest: Effort normal and breath sounds normal. No respiratory distress. She has no wheezes.  Abdominal: Soft. Bowel sounds are normal. She exhibits no distension. There is no tenderness.  No tenderness to palpation in any abdominal quadrant. No suprapubic tenderness. Abdomen soft. Nondistended. Bowel sounds normal. No peritoneal signs. No rebound or guarding. Benign abdominal examination.  Neurological: She is alert and oriented to person, place, and time.     ED Treatments / Results  Labs (all labs ordered are listed, but only abnormal results are displayed) Labs Reviewed    COMPREHENSIVE METABOLIC PANEL - Abnormal; Notable for the following:       Result Value   Glucose, Bld 102 (*)    Albumin 3.4 (*)    All other components within normal limits  LIPASE, BLOOD  CBC  URINALYSIS, ROUTINE W REFLEX MICROSCOPIC  PREGNANCY, URINE    EKG  EKG Interpretation None       Radiology No results found.  Procedures Procedures (including critical care time)  Medications Ordered in ED Medications - No data to display   Initial Impression / Assessment and Plan / ED Course  I have reviewed the triage vital signs and the nursing notes.  Pertinent labs & imaging results that were available during my care of the patient were reviewed by me and considered in my medical decision making (see chart for details).     Patient presents with bilateral lower quadrant abdominal pain and diarrhea that started this morning. Pain is resolved after diarrhea. Patient had several alcoholic beverages last night and woke up feeling this way. Additionally, she states that over the previous several weeks she is felt bloated and he sent her abdomen is growing in size. She also states that her breasts feel larger and tender to palpation. Her physical examination is unrevealing. Abdominal examination is benign. Laboratory evaluation reveals a negative urine pregnancy test. Urinalysis without signs of infection or hematuria. CBC without leukocytosis or anemia. Comments metabolic panel largely unremarkable. Patient has remained afebrile and hemodynamically stable. At this time I think the most likely etiology of the patient's presentation is secondary to alcohol-induced acute gastritis. Patient also has a history of gastroesophageal reflux disease which may be contributing to her daily symptoms that have been ongoing for several weeks. I discussed with her the importance of starting a proton pump inhibitor and remaining on this for a minimum of 6 weeks as a therapeutic trial. Additionally, I  discussed the importance of establishing care with a primary care physician and an OB/GYN for ongoing healthcare management. At this time the patient is stable for discharge. Return precautions discussed.  Final Clinical Impressions(s) / ED Diagnoses   Final diagnoses:  Lower abdominal pain  Gastroesophageal reflux disease, esophagitis presence not specified    New Prescriptions New Prescriptions   No medications on file     Thomasene LotJames Bennet Kujawa, MD 06/24/16 [id number]    Blane OharaJoshua Zavitz, MD 06/24/16 1556

## 2016-06-24 NOTE — Discharge Instructions (Signed)
At this time I think your diarrhea and abdominal pain were caused by alcohol.  Also, I think some of your symptoms you have been having every day for the past several weeks may be caused by gastroesophageal reflux disease or heartburn. I've attached information about this disease.  I recommend starting omeprazole for the treatment of your heartburn. This medication is available over-the-counter without a prescription. Please take 1 pill daily for a minimum of 4-6 weeks.  Please ensure you make an appointment to see a primary care physician to establish care.

## 2016-06-24 NOTE — ED Triage Notes (Signed)
PT states generalized aches with nausea and diarrhea that started yesterday after drinking about 3 alcoholic drinks. PT also states unsure if she is pregnant or not. Pt denies any urinary symptoms or vaginal discharge.

## 2016-09-16 ENCOUNTER — Encounter (HOSPITAL_COMMUNITY): Payer: Self-pay | Admitting: *Deleted

## 2016-09-16 ENCOUNTER — Emergency Department (HOSPITAL_COMMUNITY)
Admission: EM | Admit: 2016-09-16 | Discharge: 2016-09-16 | Disposition: A | Discharge status: Home / Self Care | Payer: BLUE CROSS/BLUE SHIELD | Attending: Emergency Medicine | Admitting: Emergency Medicine

## 2016-09-16 DIAGNOSIS — Z7982 Long term (current) use of aspirin: Secondary | ICD-10-CM | POA: Insufficient documentation

## 2016-09-16 DIAGNOSIS — Z87891 Personal history of nicotine dependence: Secondary | ICD-10-CM | POA: Insufficient documentation

## 2016-09-16 DIAGNOSIS — B349 Viral infection, unspecified: Secondary | ICD-10-CM

## 2016-09-16 DIAGNOSIS — J45909 Unspecified asthma, uncomplicated: Secondary | ICD-10-CM | POA: Diagnosis not present

## 2016-09-16 DIAGNOSIS — J069 Acute upper respiratory infection, unspecified: Secondary | ICD-10-CM | POA: Diagnosis present

## 2016-09-16 DIAGNOSIS — Z79899 Other long term (current) drug therapy: Secondary | ICD-10-CM | POA: Insufficient documentation

## 2016-09-16 LAB — RAPID STREP SCREEN (MED CTR MEBANE ONLY): STREPTOCOCCUS, GROUP A SCREEN (DIRECT): NEGATIVE

## 2016-09-16 MED ORDER — FLUTICASONE PROPIONATE 50 MCG/ACT NA SUSP: 1.0000 | Freq: Every day | NASAL | 2 refills | Status: DC

## 2016-09-16 MED ORDER — BENZONATATE 100 MG PO CAPS: 200.0000 mg | ORAL_CAPSULE | Freq: Two times a day (BID) | ORAL | 0 refills | Status: DC | PRN

## 2016-09-16 MED ORDER — HYDROCODONE-ACETAMINOPHEN 7.5-325 MG/15ML PO SOLN: 15.0000 mL | Freq: Three times a day (TID) | ORAL | 0 refills | Status: DC | PRN

## 2016-09-16 MED ORDER — ACETAMINOPHEN 500 MG PO TABS
1000.0000 mg | ORAL_TABLET | Freq: Once | ORAL | Status: AC
  Administered 2016-09-16: 1000 mg via ORAL
  Filled 2016-09-16: qty 2

## 2016-09-16 NOTE — ED Provider Notes (Signed)
[facility] DEPT Provider Note   CSN: [id number] Arrival date & time: 09/16/16  0753     History   Chief Complaint Chief Complaint  Patient presents with  . URI    HPI Tasha Hughes is a 26 y.o. female.  HPI   Patient is a 26 year old female with history of asthma, PCOS and GERD who presents the ED with sore throat. Patient reports over the past 3 days she has had chills, generalized body ache, nasal congestion, rhinorrhea, sore throat, cough and decreased appetite. She reports having a fever of 102 on Friday and reports since taking TheraFlu her fever has resolved. She states she has continued taking TheraFlu and Alka-Seltzer with mild improvement of her symptoms. Denies headache, ear pain/drainage, watery/itchy eyes, dizziness, facial swelling, drooling, trismus, moist change, shortness of breath, wheezing, chest pain, abdominal pain, vomiting, diarrhea, rash. Pt denies any known sick contacts. Denies taking any medications today.  Past Medical History:  Diagnosis Date  . Asthma   . GERD (gastroesophageal reflux disease)   . Polycystic ovarian disease     There are no active problems to display for this patient.   Past Surgical History:  Procedure Laterality Date  . TONSILLECTOMY      OB History    Gravida Para Term Preterm AB Living   0 0 0 0 0 0   SAB TAB Ectopic Multiple Live Births   0 0 0 0         Home Medications    Prior to Admission medications   Medication Sig Start Date End Date Taking? Authorizing Provider  Aspirin-Salicylamide-Caffeine (BC HEADACHE POWDER PO) Take 1 packet by mouth daily as needed (for headaches). For headache   Yes Historical Provider, MD  Biotin 1 MG CAPS Take 1 mg by mouth daily.   Yes Historical Provider, MD  benzonatate (TESSALON) 100 MG capsule Take 2 capsules (200 mg total) by mouth 2 (two) times daily as needed for cough. 09/16/16   Barrett Henle, PA-C  cyclobenzaprine (FLEXERIL) 10 MG tablet Take 1 tablet (10  mg total) by mouth 2 (two) times daily as needed for muscle spasms. Patient not taking: Reported on 09/16/2016 12/09/15   Marily Memos, MD  fluticasone [facility]) 50 MCG/ACT nasal spray Place 1 spray into both nostrils daily. 09/16/16   Barrett Henle, PA-C  HYDROcodone-acetaminophen (HYCET) 7.5-325 mg/15 ml solution Take 15 mLs by mouth every 8 (eight) hours as needed for moderate pain. 09/16/16   Barrett Henle, PA-C  ibuprofen (ADVIL,MOTRIN) 800 MG tablet Take 1 tablet (800 mg total) by mouth 3 (three) times daily. Patient not taking: Reported on 09/16/2016 12/09/15   Marily Memos, MD    Family History Family History  Problem Relation Age of Onset  . Hypertension Mother     Social History Social History  Substance Use Topics  . Smoking status: Former Smoker    Packs/day: 1.00    Years: 6.00    Types: Cigarettes    Quit date: 05/28/2016  . Smokeless tobacco: Never Used  . Alcohol use Yes     Comment: rarely     Allergies   Bee venom; Other; Penicillins; Robitussin [guaifenesin]; Shrimp [shellfish allergy]; and Sudafed [pseudoephedrine hcl]   Review of Systems Review of Systems  Constitutional: Positive for appetite change (decreased), chills and fever.  HENT: Positive for congestion, rhinorrhea and sore throat.   Respiratory: Positive for cough.   Musculoskeletal: Positive for myalgias (generalized).  All other systems reviewed and are negative.  Physical Exam Updated Vital Signs BP 114/71 (BP Location: Right Arm)   Pulse 94   Temp 99 F (37.2 C) (Oral)   Resp 18   Ht  (1.651 m)   Wt 129.3 kg   LMP 08/29/2016   SpO2 97%   BMI 47.43 kg/m   Physical Exam  Constitutional: She is oriented to person, place, and time. She appears well-developed and well-nourished. No distress.  HENT:  Head: Normocephalic and atraumatic.  Right Ear: Tympanic membrane normal.  Left Ear: Tympanic membrane normal.  Nose: Mucosal edema and rhinorrhea present. Right  sinus exhibits no maxillary sinus tenderness and no frontal sinus tenderness. Left sinus exhibits no maxillary sinus tenderness and no frontal sinus tenderness.  Mouth/Throat: Uvula is midline and mucous membranes are normal. No oral lesions. No trismus in the jaw. No uvula swelling. Oropharyngeal exudate and posterior oropharyngeal erythema present. No posterior oropharyngeal edema or tonsillar abscesses.  S/p tonsillectomy.  Posterior oropharynx with erythema and white exudate. No trismus, drooling, facial/neck swelling or stridor on exam. No muffled voice. Floor of mouth soft.  Pt tolerating secretions.   Eyes: Conjunctivae and EOM are normal. Pupils are equal, round, and reactive to light. Right eye exhibits no discharge. Left eye exhibits no discharge. No scleral icterus.  Neck: Normal range of motion and full passive range of motion without pain. Neck supple. No spinous process tenderness and no muscular tenderness present. No neck rigidity. No edema, no erythema and normal range of motion present.  Cardiovascular: Normal rate, regular rhythm, normal heart sounds and intact distal pulses.   HR 98  Pulmonary/Chest: Effort normal and breath sounds normal. No respiratory distress. She has no wheezes. She has no rales. She exhibits no tenderness.  Abdominal: Soft. Bowel sounds are normal. She exhibits no distension and no mass. There is no tenderness. There is no rebound and no guarding. No hernia.  Musculoskeletal: Normal range of motion. She exhibits no edema.  Lymphadenopathy:    She has cervical adenopathy (bilateral submandibular).  Neurological: She is alert and oriented to person, place, and time.  Skin: Skin is warm and dry. She is not diaphoretic.  Nursing note and vitals reviewed.    ED Treatments / Results  Labs (all labs ordered are listed, but only abnormal results are displayed) Labs Reviewed  RAPID STREP SCREEN (NOT AT [facility])  CULTURE, GROUP A STREP [facility])    EKG  EKG  Interpretation None       Radiology No results found.  Procedures Procedures (including critical care time)  Medications Ordered in ED Medications  acetaminophen (TYLENOL) tablet 1,000 mg (1,000 mg Oral Given 09/16/16 0850)     Initial Impression / Assessment and Plan / ED Course  I have reviewed the triage vital signs and the nursing notes.  Pertinent labs & imaging results that were available during my care of the patient were reviewed by me and considered in my medical decision making (see chart for details).     Patient presents with sore throat and upper respiratory symptoms with associated cough for the past 3 days. VSS. Exam revealed erythema and exudate to posterior oropharynx, no facial swelling, no trismus or drooling. Rhinorrhea present. Tenderness over bilateral submandibular lymph nodes. Lungs clear to auscultation bilaterally. Abdominal exam benign. Patient given ibuprofen in the ED, tolerating by mouth. Strep negative. Suspect patient's symptoms are likely due to viral pharyngitis versus viral URI. Plan to discharge patient home with symptomatic treatment and PCP follow-up. Discussed return precautions.  Final  Clinical Impressions(s) / ED Diagnoses   Final diagnoses:  Viral illness    New Prescriptions New Prescriptions   BENZONATATE (TESSALON) 100 MG CAPSULE    Take 2 capsules (200 mg total) by mouth 2 (two) times daily as needed for cough.   FLUTICASONE (FLONASE) 50 MCG/ACT NASAL SPRAY    Place 1 spray into both nostrils daily.   HYDROCODONE-ACETAMINOPHEN (HYCET) 7.5-325 MG/15 ML SOLUTION    Take 15 mLs by mouth every 8 (eight) hours as needed for moderate pain.     Satira Sark [place], [place] 09/16/16 1011    Geoffery Lyons, MD 09/17/16 [phone]

## 2016-09-16 NOTE — Discharge Instructions (Signed)
Take your medications as prescribed. I also recommend taking Tylenol and ibuprofen as prescribed over-the-counter, alternating between doses every 3-4 hours. Continue drinking fluids at home to remain hydrated. I recommend eating a bland diet for the next few days and taper symptoms have improved. °Follow-up with your primary care provider in the next 3-4 days if symptoms have not improved. °Return to the emergency department if symptoms worsen or new onset of headache, neck stiffness, difficulty breathing, coughing up blood, chest pain, abdominal pain, vomiting, unable to keep fluids down.  °

## 2016-09-16 NOTE — ED Triage Notes (Signed)
Pt states sore throat, cough, body aches, headache, neck pain, fevers of 102 since Friday.

## 2016-09-18 LAB — CULTURE, GROUP A STREP (THRC)

## 2016-09-28 ENCOUNTER — Encounter (HOSPITAL_COMMUNITY): Payer: Self-pay | Admitting: Emergency Medicine

## 2016-09-28 ENCOUNTER — Emergency Department (HOSPITAL_COMMUNITY)
Admission: EM | Admit: 2016-09-28 | Discharge: 2016-09-28 | Disposition: A | Discharge status: Home / Self Care | Payer: BLUE CROSS/BLUE SHIELD | Attending: Emergency Medicine | Admitting: Emergency Medicine

## 2016-09-28 DIAGNOSIS — J45909 Unspecified asthma, uncomplicated: Secondary | ICD-10-CM | POA: Diagnosis not present

## 2016-09-28 DIAGNOSIS — Z7982 Long term (current) use of aspirin: Secondary | ICD-10-CM | POA: Diagnosis not present

## 2016-09-28 DIAGNOSIS — M549 Dorsalgia, unspecified: Secondary | ICD-10-CM | POA: Diagnosis present

## 2016-09-28 DIAGNOSIS — M62830 Muscle spasm of back: Secondary | ICD-10-CM | POA: Diagnosis not present

## 2016-09-28 DIAGNOSIS — Z87891 Personal history of nicotine dependence: Secondary | ICD-10-CM | POA: Diagnosis not present

## 2016-09-28 MED ORDER — DICLOFENAC SODIUM 50 MG PO TBEC: 50.0000 mg | DELAYED_RELEASE_TABLET | Freq: Two times a day (BID) | ORAL | 0 refills | Status: DC

## 2016-09-28 MED ORDER — CYCLOBENZAPRINE HCL 10 MG PO TABS: 10.0000 mg | ORAL_TABLET | Freq: Two times a day (BID) | ORAL | 0 refills | Status: DC | PRN

## 2016-09-28 MED ORDER — HYDROCORTISONE 1 % EX CREA: TOPICAL_CREAM | CUTANEOUS | 0 refills | Status: DC

## 2016-09-28 MED ORDER — MAGIC MOUTHWASH: 5.0000 mL | Freq: Four times a day (QID) | ORAL | 0 refills | Status: DC | PRN

## 2016-09-28 MED ORDER — LIDOCAINE VISCOUS 2 % MT SOLN: 15.0000 mL | Freq: Once | OROMUCOSAL | Status: DC
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
## 2016-09-28 NOTE — Discharge Instructions (Signed)
Do not work or drive while taking the muscle relaxant as it will make you sleepy.

## 2017-01-31 ENCOUNTER — Emergency Department (HOSPITAL_COMMUNITY)
Admission: EM | Admit: 2017-01-31 | Discharge: 2017-01-31 | Disposition: A | Discharge status: Home / Self Care | Payer: BLUE CROSS/BLUE SHIELD | Attending: Emergency Medicine | Admitting: Emergency Medicine

## 2017-01-31 ENCOUNTER — Encounter (HOSPITAL_COMMUNITY): Payer: Self-pay

## 2017-01-31 DIAGNOSIS — R110 Nausea: Secondary | ICD-10-CM | POA: Insufficient documentation

## 2017-01-31 DIAGNOSIS — Z5321 Procedure and treatment not carried out due to patient leaving prior to being seen by health care provider: Secondary | ICD-10-CM | POA: Insufficient documentation

## 2017-01-31 NOTE — ED Notes (Signed)
Pt stated if we would not be able to tell her what the cause of her nausea was she would follow up w/ a GI doctor. Pt then asked for a work note for tonight.

## 2017-01-31 NOTE — ED Notes (Signed)
Pt took hand written work note & left department. Advised pt we would check for pregnancy while waiting on the MD & w should be able to get some medication that may be helpful. Pt stated she may be back later.

## 2017-01-31 NOTE — ED Triage Notes (Signed)
Pt c/o nausea x 2 weeks, states is worse when she is at work at the [employer].   Pt states she has also been having "flutters in my stomach at times" but denies abd pain

## 2017-08-03 ENCOUNTER — Emergency Department (HOSPITAL_COMMUNITY)
Admission: EM | Admit: 2017-08-03 | Discharge: 2017-08-04 | Disposition: A | Discharge status: Home / Self Care | Payer: BLUE CROSS/BLUE SHIELD | Attending: Emergency Medicine | Admitting: Emergency Medicine

## 2017-08-03 ENCOUNTER — Encounter (HOSPITAL_COMMUNITY): Payer: Self-pay | Admitting: Emergency Medicine

## 2017-08-03 DIAGNOSIS — Z5321 Procedure and treatment not carried out due to patient leaving prior to being seen by health care provider: Secondary | ICD-10-CM | POA: Insufficient documentation

## 2017-08-03 DIAGNOSIS — R51 Headache: Secondary | ICD-10-CM | POA: Diagnosis not present

## 2017-08-03 MED ORDER — ONDANSETRON 4 MG PO TBDP
4.0000 mg | ORAL_TABLET | Freq: Once | ORAL | Status: AC
  Administered 2017-08-03: 4 mg via ORAL
  Filled 2017-08-03: qty 1

## 2017-08-03 MED ORDER — OXYCODONE-ACETAMINOPHEN 5-325 MG PO TABS
1.0000 | ORAL_TABLET | ORAL | Status: DC | PRN
  Administered 2017-08-03: 1 via ORAL
  Filled 2017-08-03: qty 1

## 2017-08-03 NOTE — ED Triage Notes (Signed)
Reports what feels like a sinus headache that started yesterday.   Sensitive to light and having n/v.

## 2017-08-04 ENCOUNTER — Emergency Department (HOSPITAL_COMMUNITY)
Admission: EM | Admit: 2017-08-04 | Discharge: 2017-08-04 | Disposition: A | Discharge status: Home / Self Care | Payer: BLUE CROSS/BLUE SHIELD | Source: Home / Self Care | Attending: Emergency Medicine | Admitting: Emergency Medicine

## 2017-08-04 ENCOUNTER — Emergency Department (HOSPITAL_COMMUNITY)
Admission: EM | Admit: 2017-08-04 | Discharge: 2017-08-04 | Discharge status: Against Medical Advice | Payer: BLUE CROSS/BLUE SHIELD | Source: Home / Self Care

## 2017-08-04 ENCOUNTER — Encounter (HOSPITAL_COMMUNITY): Payer: Self-pay | Admitting: Emergency Medicine

## 2017-08-04 ENCOUNTER — Other Ambulatory Visit: Payer: Self-pay

## 2017-08-04 DIAGNOSIS — R519 Headache, unspecified: Secondary | ICD-10-CM

## 2017-08-04 DIAGNOSIS — J45909 Unspecified asthma, uncomplicated: Secondary | ICD-10-CM

## 2017-08-04 DIAGNOSIS — Z87891 Personal history of nicotine dependence: Secondary | ICD-10-CM | POA: Insufficient documentation

## 2017-08-04 DIAGNOSIS — R51 Headache: Secondary | ICD-10-CM | POA: Insufficient documentation

## 2017-08-04 LAB — COMPREHENSIVE METABOLIC PANEL
ALK PHOS: 58 U/L (ref 38–126)
ALT: 19 U/L (ref 14–54)
AST: 16 U/L (ref 15–41)
Albumin: 3.6 g/dL (ref 3.5–5.0)
Anion gap: 11 (ref 5–15)
BUN: 12 mg/dL (ref 6–20)
CALCIUM: 9 mg/dL (ref 8.9–10.3)
CHLORIDE: 104 mmol/L (ref 101–111)
CO2: 24 mmol/L (ref 22–32)
Creatinine, Ser: 0.57 mg/dL (ref 0.44–1.00)
GFR calc Af Amer: >60 mL/min (ref >60)
Glucose, Bld: 92 mg/dL (ref 65–99)
Potassium: 3.6 mmol/L (ref 3.5–5.1)
Sodium: 139 mmol/L (ref 135–145)
TOTAL PROTEIN: 7 g/dL (ref 6.5–8.1)
Total Bilirubin: 0.4 mg/dL (ref 0.3–1.2)

## 2017-08-04 LAB — CBC WITH DIFFERENTIAL/PLATELET
BASOS ABS: 0 10*3/uL (ref 0.0–0.1)
Basophils Relative: 0 %
EOS PCT: 0 %
Eosinophils Absolute: 0 10*3/uL (ref 0.0–0.7)
HCT: 39.7 % (ref 36.0–46.0)
HEMOGLOBIN: 12.5 g/dL (ref 12.0–15.0)
LYMPHS PCT: 22 %
Lymphs Abs: 1.3 10*3/uL (ref 0.7–4.0)
MCH: 27.2 pg (ref 26.0–34.0)
MCHC: 31.5 g/dL (ref 30.0–36.0)
MCV: 86.5 fL (ref 78.0–100.0)
Monocytes Absolute: 0.3 10*3/uL (ref 0.1–1.0)
Monocytes Relative: 5 %
NEUTROS ABS: 4.3 10*3/uL (ref 1.7–7.7)
NEUTROS PCT: 73 %
PLATELETS: 277 10*3/uL (ref 150–400)
RBC: 4.59 MIL/uL (ref 3.87–5.11)
RDW: 15.3 % (ref 11.5–15.5)
WBC: 6 10*3/uL (ref 4.0–10.5)

## 2017-08-04 LAB — URINALYSIS, ROUTINE W REFLEX MICROSCOPIC
BILIRUBIN URINE: NEGATIVE
Bacteria, UA: NONE SEEN
GLUCOSE, UA: NEGATIVE mg/dL
KETONES UR: 20 mg/dL — AB
LEUKOCYTES UA: NEGATIVE
NITRITE: NEGATIVE
PH: 5 (ref 5.0–8.0)
Protein, ur: 100 mg/dL — AB
SPECIFIC GRAVITY, URINE: 1.027 (ref 1.005–1.030)

## 2017-08-04 LAB — INFLUENZA PANEL BY PCR (TYPE A & B)
Influenza A By PCR: NEGATIVE
Influenza B By PCR: NEGATIVE

## 2017-08-04 LAB — I-STAT BETA HCG BLOOD, ED (MC, WL, AP ONLY): I-stat hCG, quantitative: <5 m[IU]/mL (ref <5)

## 2017-08-04 MED ORDER — KETOROLAC TROMETHAMINE 30 MG/ML IJ SOLN
30.0000 mg | Freq: Once | INTRAMUSCULAR | Status: AC
Start: 2017-08-04 — End: 2017-08-04
  Administered 2017-08-04: 30 mg via INTRAVENOUS
  Filled 2017-08-04: qty 1

## 2017-08-04 MED ORDER — DIPHENHYDRAMINE HCL 50 MG/ML IJ SOLN
25.0000 mg | Freq: Once | INTRAMUSCULAR | Status: AC
  Administered 2017-08-04: 25 mg via INTRAVENOUS
  Filled 2017-08-04: qty 1

## 2017-08-04 MED ORDER — SODIUM CHLORIDE 0.9 % IV BOLUS (SEPSIS)
1000.0000 mL | Freq: Once | INTRAVENOUS | Status: AC
  Administered 2017-08-04: 1000 mL via INTRAVENOUS

## 2017-08-04 MED ORDER — AZITHROMYCIN 250 MG PO TABS: 250.0000 mg | ORAL_TABLET | Freq: Every day | ORAL | 0 refills | Status: DC

## 2017-08-04 MED ORDER — METOCLOPRAMIDE HCL 5 MG/ML IJ SOLN
10.0000 mg | Freq: Once | INTRAMUSCULAR | Status: AC
  Administered 2017-08-04: 10 mg via INTRAVENOUS
  Filled 2017-08-04: qty 2

## 2017-08-04 MED ORDER — ACETAMINOPHEN 325 MG PO TABS
650.0000 mg | ORAL_TABLET | ORAL | Status: DC | PRN
  Administered 2017-08-04: 650 mg via ORAL
  Filled 2017-08-04: qty 2

## 2017-08-04 MED ORDER — LORATADINE 10 MG PO TABS: 10.0000 mg | ORAL_TABLET | Freq: Every day | ORAL | 0 refills | Status: DC

## 2017-08-04 MED ORDER — METHYLPREDNISOLONE SODIUM SUCC 125 MG IJ SOLR
125.0000 mg | Freq: Once | INTRAMUSCULAR | Status: AC
  Administered 2017-08-04: 125 mg via INTRAVENOUS
  Filled 2017-08-04: qty 2

## 2017-08-04 MED ORDER — METOCLOPRAMIDE HCL 10 MG PO TABS: 10.0000 mg | ORAL_TABLET | Freq: Four times a day (QID) | ORAL | 0 refills | Status: DC | PRN

## 2017-08-04 MED ORDER — FLUTICASONE PROPIONATE 50 MCG/ACT NA SUSP: 2.0000 | Freq: Every day | NASAL | 2 refills | Status: DC

## 2017-08-04 MED ORDER — IBUPROFEN 600 MG PO TABS: 600.0000 mg | ORAL_TABLET | Freq: Four times a day (QID) | ORAL | 0 refills | Status: DC | PRN

## 2017-08-04 NOTE — ED Notes (Signed)
EDP at bedside updating patient and family. 

## 2017-08-04 NOTE — ED Notes (Signed)
Pt was moved to OTF previously during shift after staff was unable to locate.

## 2017-08-04 NOTE — ED Notes (Signed)
No answer x1 for treatment room 

## 2017-08-04 NOTE — ED Provider Notes (Signed)
[facility] EMERGENCY DEPARTMENT Provider Note   CSN: [id number] Arrival date & time: 08/04/17  4098     History   Chief Complaint Chief Complaint  Patient presents with  . Headache    HPI Tasha Hughes is a 27 y.o. female.  HPI Patient presents with 3 days of headache, generalized fevers and chills, myalgias, nasal congestion, sinus pressure, mild nonproductive cough.  Endorses photophobia but denies neck stiffness, focal weakness or numbness.  She has had nausea and some vomiting but denies abdominal pain.  States she is been taking Alka-Seltzer without relief. Past Medical History:  Diagnosis Date  . Asthma   . GERD (gastroesophageal reflux disease)   . Polycystic ovarian disease     There are no active problems to display for this patient.   Past Surgical History:  Procedure Laterality Date  . TONSILLECTOMY      OB History    Gravida Para Term Preterm AB Living   0 0 0 0 0 0   SAB TAB Ectopic Multiple Live Births   0 0 0 0         Home Medications    Prior to Admission medications   Medication Sig Start Date End Date Taking? Authorizing Provider  Aspirin Effervescent (ALKA-SELTZER ORIGINAL PO) Take 1 packet by mouth daily as needed (Headache).   Yes [provider]  Aspirin-Salicylamide-Caffeine (BC HEADACHE POWDER PO) Take 1 packet by mouth daily as needed (for headaches). For headache   Yes [provider]  azithromycin (ZITHROMAX) 250 MG tablet Take 1 tablet (250 mg total) by mouth daily. Take first 2 tablets together, then 1 every day until finished. 08/04/17   Loren Racer, MD  benzonatate (TESSALON) 100 MG capsule Take 2 capsules (200 mg total) by mouth 2 (two) times daily as needed for cough. Patient not taking: Reported on 08/04/2017 09/16/16   Barrett Henle, PA-C  Biotin 1 MG CAPS Take 1 mg by mouth daily.    [provider]  cyclobenzaprine (FLEXERIL) 10 MG tablet Take 1 tablet (10 mg total) by mouth 2 (two)  times daily as needed for muscle spasms. Patient not taking: Reported on 08/04/2017 09/28/16   Janne Napoleon, NP  diclofenac (VOLTAREN) 50 MG EC tablet Take 1 tablet (50 mg total) by mouth 2 (two) times daily. Patient not taking: Reported on 08/04/2017 09/28/16   Janne Napoleon, NP  fluticasone [facility]) 50 MCG/ACT nasal spray Place 2 sprays into both nostrils daily. 08/04/17   Loren Racer, MD  HYDROcodone-acetaminophen (HYCET) 7.5-325 mg/15 ml solution Take 15 mLs by mouth every 8 (eight) hours as needed for moderate pain. Patient not taking: Reported on 08/04/2017 09/16/16   Barrett Henle, PA-C  ibuprofen (ADVIL,MOTRIN) 600 MG tablet Take 1 tablet (600 mg total) by mouth every 6 (six) hours as needed. 08/04/17   Loren Racer, MD  loratadine (CLARITIN) 10 MG tablet Take 1 tablet (10 mg total) by mouth daily. 08/04/17   Loren Racer, MD  metoCLOPramide (REGLAN) 10 MG tablet Take 1 tablet (10 mg total) by mouth every 6 (six) hours as needed for nausea (headache). 08/04/17   Loren Racer, MD    Family History Family History  Problem Relation Age of Onset  . Hypertension Mother     Social History Social History   Tobacco Use  . Smoking status: Former Smoker    Packs/day: 1.00    Years: 6.00    Pack years: 6.00    Types: Cigarettes  Last attempt to quit: 05/28/2016    Years since quitting: 1.1  . Smokeless tobacco: Never Used  Substance Use Topics  . Alcohol use: Yes    Comment: rarely  . Drug use: No     Allergies   Bee venom; Other; Penicillins; Robitussin [guaifenesin]; Shrimp [shellfish allergy]; and Sudafed [pseudoephedrine hcl]   Review of Systems Review of Systems  Constitutional: Positive for chills, fatigue and fever.  HENT: Positive for congestion, sinus pressure and sinus pain. Negative for sore throat.   Respiratory: Positive for cough. Negative for chest tightness and shortness of breath.   Cardiovascular: Negative for chest pain.    Gastrointestinal: Positive for nausea and vomiting. Negative for abdominal pain, constipation and diarrhea.  Genitourinary: Negative for dysuria, flank pain and frequency.  Musculoskeletal: Positive for myalgias. Negative for arthralgias, back pain, neck pain and neck stiffness.  Skin: Negative for rash and wound.  Neurological: Positive for headaches. Negative for dizziness, syncope, weakness, light-headedness and numbness.  All other systems reviewed and are negative.    Physical Exam Updated Vital Signs BP 114/77   Pulse (!) 59   Temp 98.5 F (36.9 C) (Oral)   Resp 18   Ht 5\' 5"  (1.651 m)   Wt 132 kg (291 lb)   LMP 08/01/2017   SpO2 100%   BMI 48.42 kg/m   Physical Exam  Constitutional: She is oriented to person, place, and time. She appears well-developed and well-nourished. No distress.  HENT:  Head: Normocephalic and atraumatic.  Mouth/Throat: Oropharynx is clear and moist. No oropharyngeal exudate.  Bilateral nasal mucosal edema.  Patient has bilateral frontal sinus tenderness to percussion.  Oropharynx is clear.  Eyes: EOM are normal. Pupils are equal, round, and reactive to light.  Neck: Normal range of motion. Neck supple.  No meningismus.  Cardiovascular: Normal rate and regular rhythm. Exam reveals no gallop and no friction rub.  No murmur heard. Pulmonary/Chest: Effort normal and breath sounds normal. No stridor. No respiratory distress. She has no wheezes. She has no rales. She exhibits no tenderness.  Abdominal: Soft. Bowel sounds are normal. She exhibits no distension and no mass. There is no tenderness. There is no rebound and no guarding. No hernia.  Musculoskeletal: Normal range of motion. She exhibits no edema or tenderness.  No lower extremity swelling, asymmetry or tenderness.  No CVA tenderness.  Lymphadenopathy:    She has no cervical adenopathy.  Neurological: She is alert and oriented to person, place, and time.  Moves all extremities without focal  deficit.  Sensation fully intact.  Skin: Skin is warm and dry. Capillary refill takes less than 2 seconds. No rash noted. She is not diaphoretic. No erythema.  Psychiatric: She has a normal mood and affect. Her behavior is normal.  Nursing note and vitals reviewed.    ED Treatments / Results  Labs (all labs ordered are listed, but only abnormal results are displayed) Labs Reviewed  URINALYSIS, ROUTINE W REFLEX MICROSCOPIC - Abnormal; Notable for the following components:      Result Value   APPearance HAZY (*)    Hgb urine dipstick LARGE (*)    Ketones, ur 20 (*)    Protein, ur 100 (*)    Squamous Epithelial / LPF 0-5 (*)    All other components within normal limits  CBC WITH DIFFERENTIAL/PLATELET  COMPREHENSIVE METABOLIC PANEL  INFLUENZA PANEL BY PCR (TYPE A & B)  I-STAT BETA HCG BLOOD, ED ([facility], [facility], [facility] ONLY)    EKG  EKG  Interpretation None       Radiology No results found.  Procedures Procedures (including critical care time)  Medications Ordered in ED Medications  acetaminophen (TYLENOL) tablet 650 mg (650 mg Oral Given 08/04/17 1111)  sodium chloride 0.9 % bolus 1,000 mL (0 mLs Intravenous Stopped 08/04/17 1152)  metoCLOPramide (REGLAN) injection 10 mg (10 mg Intravenous Given 08/04/17 1111)  diphenhydrAMINE (BENADRYL) injection 25 mg (25 mg Intravenous Given 08/04/17 1111)  methylPREDNISolone sodium succinate (SOLU-MEDROL) 125 mg/2 mL injection 125 mg (125 mg Intravenous Given 08/04/17 1111)  ketorolac (TORADOL) 30 MG/ML injection 30 mg (30 mg Intravenous Given 08/04/17 1128)     Initial Impression / Assessment and Plan / ED Course  I have reviewed the triage vital signs and the nursing notes.  Pertinent labs & imaging results that were available during my care of the patient were reviewed by me and considered in my medical decision making (see chart for details).     She states her headache is near completely resolved.  Has characteristics of migraine headache  and sinus headache.  Normal neurologic exam.  Return precautions have been given.  Final Clinical Impressions(s) / ED Diagnoses   Final diagnoses:  Sinus headache    ED Discharge Orders        Ordered    fluticasone (FLONASE) 50 MCG/ACT nasal spray  Daily     08/04/17 1327    loratadine (CLARITIN) 10 MG tablet  Daily     08/04/17 1327    azithromycin (ZITHROMAX) 250 MG tablet  Daily     08/04/17 1327    ibuprofen (ADVIL,MOTRIN) 600 MG tablet  Every 6 hours PRN     08/04/17 1327    metoCLOPramide (REGLAN) 10 MG tablet  Every 6 hours PRN     08/04/17 1327       Loren Racer, MD 08/04/17 1328

## 2017-08-04 NOTE — ED Triage Notes (Signed)
PT c/o headache x3 days and n/v that started last night. PT c/o history of headaches and has light/noise sensitivity.

## 2018-12-23 ENCOUNTER — Emergency Department (HOSPITAL_COMMUNITY)
Admission: EM | Admit: 2018-12-23 | Discharge: 2018-12-23 | Disposition: A | Discharge status: Home / Self Care | Payer: 59 | Attending: Emergency Medicine | Admitting: Emergency Medicine

## 2018-12-23 DIAGNOSIS — Z87891 Personal history of nicotine dependence: Secondary | ICD-10-CM | POA: Diagnosis not present

## 2018-12-23 DIAGNOSIS — T782XXA Anaphylactic shock, unspecified, initial encounter: Secondary | ICD-10-CM | POA: Diagnosis not present

## 2018-12-23 DIAGNOSIS — J45909 Unspecified asthma, uncomplicated: Secondary | ICD-10-CM | POA: Diagnosis not present

## 2018-12-23 DIAGNOSIS — R21 Rash and other nonspecific skin eruption: Secondary | ICD-10-CM | POA: Diagnosis present

## 2018-12-23 MED ORDER — EPINEPHRINE 0.3 MG/0.3ML IJ SOAJ
0.3000 mg | Freq: Once | INTRAMUSCULAR | Status: AC
  Administered 2018-12-23: 0.3 mg via INTRAMUSCULAR
  Filled 2018-12-23: qty 0.3

## 2018-12-23 MED ORDER — PREDNISONE 10 MG PO TABS: 50.0000 mg | ORAL_TABLET | Freq: Every day | ORAL | 0 refills | Status: DC

## 2018-12-23 MED ORDER — DIPHENHYDRAMINE HCL 25 MG PO CAPS
50.0000 mg | ORAL_CAPSULE | Freq: Once | ORAL | Status: AC
  Administered 2018-12-23: 50 mg via ORAL
  Filled 2018-12-23: qty 2

## 2018-12-23 MED ORDER — DIPHENHYDRAMINE HCL 25 MG PO CAPS: 25.0000 mg | ORAL_CAPSULE | Freq: Four times a day (QID) | ORAL | 0 refills | Status: DC | PRN

## 2018-12-23 MED ORDER — EPINEPHRINE 0.3 MG/0.3ML IJ SOAJ: 0.3000 mg | INTRAMUSCULAR | 1 refills | Status: DC | PRN

## 2018-12-23 MED ORDER — PREDNISONE 20 MG PO TABS
60.0000 mg | ORAL_TABLET | Freq: Once | ORAL | Status: AC
  Administered 2018-12-23: 16:00:00 60 mg via ORAL
  Filled 2018-12-23: qty 3

## 2018-12-23 NOTE — ED Provider Notes (Signed)
[facility]   CSN: [id number] Arrival date & time: 12/23/18  1339    History   Chief Complaint Chief Complaint  Patient presents with  . Allergic Reaction    HPI Tasha Hughes is a 28 y.o. female.     HPI  28 year old female comes in a chief complaint of swelling and rash.  Patient reports that she finished having her meal and within 5 minutes she started noticing itching over her body followed by rash under her breast and forearms.  Shortly after she started feeling like her throat was closing and she decided to come to the hospital.  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  She reports that she has taken this meal in the past without any problems.  Currently she still having some soreness and difficulty in swallowing.  She denies any shortness of breath, wheezing, chest pain.  She continues to have itching all over her skin without any nausea or vomiting.  Past Medical History:  Diagnosis Date  . Asthma   . GERD (gastroesophageal reflux disease)   . Polycystic ovarian disease     There are no active problems to display for this patient.   Past Surgical History:  Procedure Laterality Date  . TONSILLECTOMY       OB History    Gravida  0   Para  0   Term  0   Preterm  0   AB  0   Living  0     SAB  0   TAB  0   Ectopic  0   Multiple  0   Live Births               Home Medications    Prior to Admission medications   Medication Sig Start Date End Date Taking? Authorizing Provider  Aspirin-Salicylamide-Caffeine (BC HEADACHE POWDER PO) Take 1 packet by mouth daily as needed (for headaches). For headache   Yes [provider]  diphenhydrAMINE (BENADRYL) 25 mg capsule Take 1 capsule (25 mg total) by mouth every 6 (six) hours as needed for itching. 12/23/18   Derwood KaplanNanavati, Nyeli Holtmeyer, MD  EPINEPHrine 0.3 mg/0.3 mL IJ SOAJ injection Inject 0.3  mLs (0.3 mg total) into the muscle as needed for anaphylaxis. 12/23/18   Derwood KaplanNanavati, Caileen Veracruz, MD  predniSONE (DELTASONE) 10 MG tablet Take 5 tablets (50 mg total) by mouth daily. 12/23/18   Derwood KaplanNanavati, Whitfield Dulay, MD    Family History Family History  Problem Relation Age of Onset  . Hypertension Mother     Social History Social History   Tobacco Use  . Smoking status: Former Smoker    Packs/day: 1.00    Years: 6.00    Pack years: 6.00    Types: Cigarettes    Quit date: 05/28/2016    Years since quitting: 2.5  . Smokeless tobacco: Never Used  Substance Use Topics  . Alcohol use: Yes    Comment: rarely  . Drug use: No     Allergies   Bee venom, Other, Penicillins, Robitussin [guaifenesin], Shrimp [shellfish allergy], and Sudafed [pseudoephedrine hcl]   Review of Systems Review of Systems  Constitutional: Positive for activity change.  HENT: Positive for facial swelling and trouble swallowing. Negative for drooling and postnasal drip.   Respiratory: Negative for cough and shortness of breath.   Cardiovascular: Negative for chest pain.  Gastrointestinal: Negative for nausea and vomiting.  All other systems reviewed  and are negative.    Physical Exam Updated Vital Signs BP 119/68 (BP Location: Right Arm)   Pulse 73   Resp 16   SpO2 100%   Physical Exam Vitals signs and nursing note reviewed.  Constitutional:      Appearance: She is well-developed.  HENT:     Head: Normocephalic and atraumatic.     Mouth/Throat:     Pharynx: No oropharyngeal exudate or posterior oropharyngeal erythema.     Comments: Oral exam does not reveal any lingual edema.  Posterior pharynx is clear Eyes:     Pupils: Pupils are equal, round, and reactive to light.     Comments: Patient has periorbital edema  Neck:     Musculoskeletal: Neck supple.     Comments: No stridor Cardiovascular:     Rate and Rhythm: Normal rate and regular rhythm.     Heart sounds: Normal heart sounds.  Pulmonary:      Effort: Pulmonary effort is normal. No respiratory distress.  Skin:    General: Skin is warm and dry.     Findings: Erythema and rash present.     Comments: Erythematous rash over the upper extremity without any urticaria/wheals  Neurological:     Mental Status: She is alert and oriented to person, place, and time.      ED Treatments / Results  Labs (all labs ordered are listed, but only abnormal results are displayed) Labs Reviewed - No data to display  EKG None  Radiology No results found.  Procedures .Critical Care Performed by: Varney Biles, MD Authorized by: Varney Biles, MD   Critical care provider statement:    Critical care time (minutes):  30   Critical care was necessary to treat or prevent imminent or life-threatening deterioration of the following conditions: Anaphylaxis.   Critical care was time spent personally by me on the following activities:  Discussions with consultants, evaluation of patient's response to treatment, examination of patient, ordering and performing treatments and interventions, ordering and review of laboratory studies, ordering and review of radiographic studies, pulse oximetry, re-evaluation of patient's condition, obtaining history from patient or surrogate and review of old charts   (including critical care time)  Medications Ordered in ED Medications  EPINEPHrine (EPI-PEN) injection 0.3 mg (0.3 mg Intramuscular Given 12/23/18 1529)  predniSONE (DELTASONE) tablet 60 mg (60 mg Oral Given 12/23/18 1531)  diphenhydrAMINE (BENADRYL) capsule 50 mg (50 mg Oral Given 12/23/18 1530)     Initial Impression / Assessment and Plan / ED Course  I have reviewed the triage vital signs and the nursing notes.  Pertinent labs & imaging results that were available during my care of the patient were reviewed by me and considered in my medical decision making (see chart for details).  Clinical Course as of Dec 22 1613  Wed Dec 23, 2018  1615  Patient reassessed.  Throat symptoms have improved and she is swallowing without any difficulty.  She tolerated oral meds without any problem.  Swelling around her eyes have come down.  Itching has resolved and the rash is not pronounced anymore over her torso.   [AN]    Clinical Course User Index [AN] Varney Biles, MD      Patient comes in a chief complaint of itching, rash, swelling to her eyes and difficulty in swallowing and throat discomfort.  Likely anaphylaxis.  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  Symptoms started 5 minutes after she had a meal replacement  product for her lunch.  She has taken this product before without problems.  Given that she is having throat discomfort EpiPen was given.  Patient was reassessed at 4:00 and she felt a lot better.  Her onset of symptoms was around noon.  She was given oral medications that she tolerated.  Given that she is feeling better and we are 4 hours outside of the initial event, we feel comfortable sending her home with strict ER return precautions.  Final Clinical Impressions(s) / ED Diagnoses   Final diagnoses:  Anaphylaxis, initial encounter    ED Discharge Orders         Ordered    predniSONE (DELTASONE) 10 MG tablet  Daily     12/23/18 1604    diphenhydrAMINE (BENADRYL) 25 mg capsule  Every 6 hours PRN     12/23/18 1604    EPINEPHrine 0.3 mg/0.3 mL IJ SOAJ injection  As needed     12/23/18 1604           Derwood KaplanNanavati, Trevar Boehringer, MD 12/23/18 1615

## 2018-12-23 NOTE — ED Triage Notes (Signed)
Pt drank a meal replacement shake and then had swelling in her throat and face. Pt is able to speak in clear sentences but feels like her throat is closing.

## 2018-12-23 NOTE — Discharge Instructions (Addendum)
We saw you in the ER after you had the allergic reaction.  The reaction is severe, however, it appears to be in control and there is no increased swelling or any difficulty in breathing noted. We are not sure what caused the reaction, and it is important for you to follow up with an allergist. Please take the medications prescribed. PLEASE RETURN TO THE ER IMMEDIATELY IN CASE YOU START HAVING WORSENING SWELLING, DIFFICULTY IN BREATHING ETC.  

## 2019-05-28 DIAGNOSIS — I2699 Other pulmonary embolism without acute cor pulmonale: Secondary | ICD-10-CM

## 2019-05-28 HISTORY — DX: Other pulmonary embolism without acute cor pulmonale: I26.99

## 2020-02-16 ENCOUNTER — Encounter: Payer: 59 | Admitting: Adult Health

## 2020-03-10 LAB — HM PAP SMEAR: HM Pap smear: NEGATIVE

## 2020-04-07 ENCOUNTER — Emergency Department (HOSPITAL_COMMUNITY): Payer: 59

## 2020-04-07 ENCOUNTER — Emergency Department (HOSPITAL_COMMUNITY)
Admission: EM | Admit: 2020-04-07 | Discharge: 2020-04-08 | Disposition: A | Discharge status: Home / Self Care | Payer: 59 | Attending: Emergency Medicine | Admitting: Emergency Medicine

## 2020-04-07 ENCOUNTER — Other Ambulatory Visit: Payer: Self-pay

## 2020-04-07 ENCOUNTER — Encounter (HOSPITAL_COMMUNITY): Payer: Self-pay | Admitting: Radiology

## 2020-04-07 DIAGNOSIS — J45909 Unspecified asthma, uncomplicated: Secondary | ICD-10-CM | POA: Diagnosis not present

## 2020-04-07 DIAGNOSIS — I2694 Multiple subsegmental pulmonary emboli without acute cor pulmonale: Secondary | ICD-10-CM | POA: Diagnosis not present

## 2020-04-07 DIAGNOSIS — Z87891 Personal history of nicotine dependence: Secondary | ICD-10-CM | POA: Insufficient documentation

## 2020-04-07 DIAGNOSIS — R0781 Pleurodynia: Secondary | ICD-10-CM | POA: Diagnosis not present

## 2020-04-07 DIAGNOSIS — Z7984 Long term (current) use of oral hypoglycemic drugs: Secondary | ICD-10-CM | POA: Insufficient documentation

## 2020-04-07 DIAGNOSIS — M25512 Pain in left shoulder: Secondary | ICD-10-CM | POA: Insufficient documentation

## 2020-04-07 LAB — BASIC METABOLIC PANEL
Anion gap: 17 — ABNORMAL HIGH (ref 5–15)
BUN: 21 mg/dL — ABNORMAL HIGH (ref 6–20)
CO2: 19 mmol/L — ABNORMAL LOW (ref 22–32)
Calcium: 9.3 mg/dL (ref 8.9–10.3)
Chloride: 104 mmol/L (ref 98–111)
Creatinine, Ser: 0.68 mg/dL (ref 0.44–1.00)
GFR, Estimated: >60 mL/min (ref >60)
Glucose, Bld: 102 mg/dL — ABNORMAL HIGH (ref 70–99)
Potassium: 3.7 mmol/L (ref 3.5–5.1)
Sodium: 140 mmol/L (ref 135–145)

## 2020-04-07 LAB — D-DIMER, QUANTITATIVE: D-Dimer, Quant: 4.88 ug/mL-FEU — ABNORMAL HIGH (ref 0.00–0.50)

## 2020-04-07 LAB — I-STAT BETA HCG BLOOD, ED (NOT ORDERABLE): I-stat hCG, quantitative: <5 m[IU]/mL (ref <5)

## 2020-04-07 LAB — CBC
HCT: 43 % (ref 36.0–46.0)
Hemoglobin: 13.7 g/dL (ref 12.0–15.0)
MCH: 26.6 pg (ref 26.0–34.0)
MCHC: 31.9 g/dL (ref 30.0–36.0)
MCV: 83.3 fL (ref 80.0–100.0)
Platelets: 281 10*3/uL (ref 150–400)
RBC: 5.16 MIL/uL — ABNORMAL HIGH (ref 3.87–5.11)
RDW: 14.5 % (ref 11.5–15.5)
WBC: 11.3 10*3/uL — ABNORMAL HIGH (ref 4.0–10.5)
nRBC: 0 % (ref 0.0–0.2)

## 2020-04-07 LAB — TROPONIN I (HIGH SENSITIVITY): Troponin I (High Sensitivity): 2 ng/L (ref <18)

## 2020-04-07 MED ORDER — OXYCODONE HCL 5 MG PO CAPS: 5.0000 mg | ORAL_CAPSULE | Freq: Four times a day (QID) | ORAL | 0 refills | Status: DC | PRN

## 2020-04-07 MED ORDER — APIXABAN 5 MG PO TABS
10.0000 mg | ORAL_TABLET | ORAL | Status: AC
  Administered 2020-04-08: 10 mg via ORAL
  Filled 2020-04-07: qty 2

## 2020-04-07 MED ORDER — SODIUM CHLORIDE 0.9 % IV BOLUS
500.0000 mL | Freq: Once | INTRAVENOUS | Status: AC
  Administered 2020-04-07: 500 mL via INTRAVENOUS

## 2020-04-07 MED ORDER — MORPHINE SULFATE (PF) 4 MG/ML IV SOLN
4.0000 mg | Freq: Once | INTRAVENOUS | Status: AC
  Administered 2020-04-07: 4 mg via INTRAVENOUS
  Filled 2020-04-07: qty 1

## 2020-04-07 MED ORDER — ONDANSETRON HCL 4 MG/2ML IJ SOLN
4.0000 mg | Freq: Once | INTRAMUSCULAR | Status: AC
  Administered 2020-04-07: 4 mg via INTRAVENOUS
  Filled 2020-04-07: qty 2

## 2020-04-07 MED ORDER — IOHEXOL 350 MG/ML SOLN
100.0000 mL | Freq: Once | INTRAVENOUS | Status: AC | PRN
  Administered 2020-04-07: 100 mL via INTRAVENOUS

## 2020-04-07 MED ORDER — APIXABAN (ELIQUIS) VTE STARTER PACK (10MG AND 5MG): ORAL_TABLET | ORAL | 0 refills | Status: DC

## 2020-04-07 NOTE — ED Triage Notes (Signed)
BIB EMS from home, C/C L shoulder pain that radiates into her L side since 7 am this morning, deep breathing exacerbates her pain. Denies trauma, injury or recent strain. Denies CP, syncope, dizziness. 12 lead unremarkable w/ EMS.

## 2020-04-07 NOTE — ED Provider Notes (Signed)
[facility]-EMERGENCY DEPT Provider Note   CSN: [id number] Arrival date & time: 04/07/20  1849     History Chief Complaint  Patient presents with   Shoulder Pain    Tasha Hughes is a 29 y.o. female.  Patient with history of PCOS presents to the emergency department today for evaluation of left shoulder and left-sided chest pain starting after waking up this morning.  Patient states that it is difficult to take a deep breath due to the pain.  No shortness of breath reported.  She has never had pain like this in the past.  She denies associated fever or cough.  No nausea, vomiting, or diarrhea.  No lower extremity pain or swelling.  Patient reports initiating oral birth control pills in October.  She is also a smoker.  She denies any recent surgeries or immobilizations but does sit for long hours for her job.  She states that she works for [employer] at home.  No personal or family history of blood clotting disorders.  No personal history of cancer.  Patient was transported the hospital by EMS.        Past Medical History:  Diagnosis Date   Asthma    GERD (gastroesophageal reflux disease)    Polycystic ovarian disease     There are no problems to display for this patient.   Past Surgical History:  Procedure Laterality Date   TONSILLECTOMY       OB History    Gravida  0   Para  0   Term  0   Preterm  0   AB  0   Living  0     SAB  0   TAB  0   Ectopic  0   Multiple  0   Live Births              Family History  Problem Relation Age of Onset   Hypertension Mother     Social History   Tobacco Use   Smoking status: Former Smoker    Packs/day: 1.00    Years: 6.00    Pack years: 6.00    Types: Cigarettes    Quit date: 05/28/2016    Years since quitting: 3.8   Smokeless tobacco: Never Used  Vaping Use   Vaping Use: Never used  Substance Use Topics   Alcohol use: Yes    Comment: rarely   Drug use: No     Home Medications Prior to Admission medications   Medication Sig Start Date End Date Taking? Authorizing Provider  Aspirin-Salicylamide-Caffeine (BC HEADACHE POWDER PO) Take 1 packet by mouth daily as needed (for headaches). For headache   Yes [provider]  AUROVELA FE 1/20 1-20 MG-MCG tablet Take 1 tablet by mouth daily. 03/08/20  Yes [provider]  EPINEPHrine 0.3 mg/0.3 mL IJ SOAJ injection Inject 0.3 mLs (0.3 mg total) into the muscle as needed for anaphylaxis. 12/23/18  Yes Derwood KaplanNanavati, Ankit, MD  metFORMIN (GLUCOPHAGE) 500 MG tablet Take 500 mg by mouth every evening.  03/15/20  Yes [provider]  diphenhydrAMINE (BENADRYL) 25 mg capsule Take 1 capsule (25 mg total) by mouth every 6 (six) hours as needed for itching. Patient not taking: Reported on 04/07/2020 12/23/18   Derwood KaplanNanavati, Ankit, MD  predniSONE (DELTASONE) 10 MG tablet Take 5 tablets (50 mg total) by mouth daily. Patient not taking: Reported on 04/07/2020 12/23/18   Derwood KaplanNanavati, Ankit, MD    Allergies    Bee venom,  Other, Penicillins, Robitussin [guaifenesin], Shrimp [shellfish allergy], and Sudafed [pseudoephedrine hcl]  Review of Systems   Review of Systems  Constitutional: Negative for diaphoresis and fever.  Eyes: Negative for redness.  Respiratory: Negative for cough and shortness of breath.   Cardiovascular: Positive for chest pain. Negative for palpitations and leg swelling.  Gastrointestinal: Negative for abdominal pain, nausea and vomiting.  Genitourinary: Negative for dysuria.  Musculoskeletal: Positive for myalgias. Negative for back pain and neck pain.  Skin: Negative for rash.  Neurological: Negative for syncope and light-headedness.  Psychiatric/Behavioral: The patient is not nervous/anxious.     Physical Exam Updated Vital Signs BP 136/75 (BP Location: Left Arm)    Pulse 89    Temp 99.5 F (37.5 C) (Oral)    Resp 19    Ht 5\' 5"  (1.651 m)    Wt (!) 163.3 kg    LMP 04/07/2020  (Within Days)    SpO2 98%    BMI 59.91 kg/m   Physical Exam Vitals and nursing note reviewed.  Constitutional:      General: She is not in acute distress.    Appearance: She is well-developed.  HENT:     Head: Normocephalic and atraumatic.     Right Ear: External ear normal.     Left Ear: External ear normal.     Nose: Nose normal.  Eyes:     Conjunctiva/sclera: Conjunctivae normal.  Cardiovascular:     Rate and Rhythm: Normal rate and regular rhythm.     Heart sounds: No murmur heard.   Pulmonary:     Effort: No respiratory distress.     Breath sounds: No wheezing, rhonchi or rales.  Abdominal:     Palpations: Abdomen is soft.     Tenderness: There is no abdominal tenderness. There is no guarding or rebound.  Musculoskeletal:     Cervical back: Normal range of motion and neck supple.     Right lower leg: No edema.     Left lower leg: No edema.  Skin:    General: Skin is warm and dry.     Findings: No rash.  Neurological:     General: No focal deficit present.     Mental Status: She is alert. Mental status is at baseline.     Motor: No weakness.  Psychiatric:        Mood and Affect: Mood normal.     ED Results / Procedures / Treatments   Labs (all labs ordered are listed, but only abnormal results are displayed) Labs Reviewed  BASIC METABOLIC PANEL - Abnormal; Notable for the following components:      Result Value   CO2 19 (*)    Glucose, Bld 102 (*)    BUN 21 (*)    Anion gap 17 (*)    All other components within normal limits  CBC - Abnormal; Notable for the following components:   WBC 11.3 (*)    RBC 5.16 (*)    All other components within normal limits  D-DIMER, QUANTITATIVE (NOT AT [facility]) - Abnormal; Notable for the following components:   D-Dimer, Quant 4.88 (*)    All other components within normal limits  I-STAT BETA HCG BLOOD, ED ([facility], [facility], [facility] ONLY)  I-STAT BETA HCG BLOOD, ED (NOT ORDERABLE)  TROPONIN I (HIGH SENSITIVITY)   ED ECG REPORT   Date:  04/07/2020  Rate: 82  Rhythm: normal sinus rhythm  QRS Axis: normal  Intervals: normal  ST/T Wave abnormalities: normal  Conduction Disutrbances:none  Narrative Interpretation:   Old EKG Reviewed: unchanged  I have personally reviewed the EKG tracing and agree with the computerized printout as noted.   Radiology DG Chest 2 View  Result Date: 04/07/2020 CLINICAL DATA:  Shoulder pain left shoulder pain EXAM: CHEST - 2 VIEW COMPARISON:  None. FINDINGS: The heart size and mediastinal contours are within normal limits. Both lungs are clear. The visualized skeletal structures are unremarkable. IMPRESSION: No active cardiopulmonary disease. Electronically Signed   By: Jasmine Pang M.D.   On: 04/07/2020 19:54    Procedures Procedures (including critical care time)  Medications Ordered in ED Medications  apixaban (ELIQUIS) tablet 10 mg (has no administration in time range)  morphine 4 MG/ML injection 4 mg (4 mg Intravenous Given 04/07/20 2237)  ondansetron (ZOFRAN) injection 4 mg (4 mg Intravenous Given 04/07/20 2236)  sodium chloride 0.9 % bolus 500 mL (500 mLs Intravenous New Bag/Given 04/07/20 2240)  iohexol (OMNIPAQUE) 350 MG/ML injection 100 mL (100 mLs Intravenous Contrast Given 04/07/20 2245)    ED Course  I have reviewed the triage vital signs and the nursing notes.  Pertinent labs & imaging results that were available during my care of the patient were reviewed by me and considered in my medical decision making (see chart for details).  Patient seen and examined.  Patient had lab work ordered at triage.  Elevated D-dimer.  CTA ordered.  Added troponin and EKG.  EKG reviewed.  Vital signs reviewed and are as follows: BP 136/75 (BP Location: Left Arm)    Pulse 89    Temp 99.5 F (37.5 C) (Oral)    Resp 19    Ht 5\' 5"  (1.651 m)    Wt (!) 163.3 kg    LMP 04/07/2020 (Within Days)    SpO2 98%    BMI 59.91 kg/m   CT imaging reviewed personally.  Discussed findings with  radiologist.  Patient has bilateral segmental PE.  No signs of right heart strain.  I discussed findings with patient at bedside.  Patient is low risk pulmonary embolism severity index.  She would like to go home.  Her pain is better controlled at this time, but continues to have pain in her left back..  She has been up to the restroom and ambulated without significant shortness of breath or difficulty.  I stressed that she needs to discontinue oral birth control pills indefinitely.  She should let her prescriber know that these have been discontinued and that she developed a pulmonary embolism.  She needs to contact her primary care doctor in regards to follow-up.  We discussed need to return to the ED with uncontrolled pain, shortness of breath, lightheadedness/syncope.   12:00 AM Pt on phone talking with pharmacy now regarding instructions -- plan for d/c when she receives her medications.     MDM Rules/Calculators/A&P                          Patient with bilateral segmental pulmonary emboli.  Risk factors include recent initiation of OCPs, smoking, obesity, immobility for her job.  Low risk PESI score.  Vitals are stable without hypoxia or tachycardia.  No evidence of right heart strain on CT imaging or by EKG.  Troponin is negative.  Patient is comfortable with discharge home with close outpatient follow-up.  She seems reliable to return if symptoms worsen.    Final Clinical Impression(s) / ED Diagnoses Final diagnoses:  Multiple subsegmental pulmonary emboli without acute cor  pulmonale (HCC)  Pleurodynia  Acute pain of left shoulder    Rx / DC Orders ED Discharge Orders         Ordered    APIXABAN (ELIQUIS) VTE STARTER PACK (10MG  AND 5MG )        04/07/20 2355    oxycodone (OXY-IR) 5 MG capsule  Every 6 hours PRN        04/07/20 2355           13/12/21, PA-C 04/08/20 0001    Renne Crigler, MD 04/08/20 2205

## 2020-04-08 NOTE — Progress Notes (Signed)
ANTICOAGULATION CONSULT NOTE - Initial Consult  Pharmacy Consult for Eliquis Indication: pulmonary embolus  Allergies  Allergen Reactions  . Bee Venom Hives and Swelling  . Other Hives    Cat dander  . Penicillins Anaphylaxis and Hives    Has patient had a PCN reaction causing immediate rash, facial/tongue/throat swelling, SOB or lightheadedness with hypotension: Yes Has patient had a PCN reaction causing severe rash involving mucus membranes or skin necrosis: No Has patient had a PCN reaction that required hospitalization Yes Has patient had a PCN reaction occurring within the last 10 years: No If all of the above answers are "NO", then may proceed with Cephalosporin use.   . Robitussin [Guaifenesin] Hives and Itching  . Shrimp [Shellfish Allergy] Anaphylaxis, Hives, Itching and Swelling  . Sudafed [Pseudoephedrine Hcl] Hives    Patient Measurements: Height: 5\' 5"  (165.1 cm) Weight: (!) 163.3 kg (360 lb) IBW/kg (Calculated) : 57  Vital Signs: Temp: 99.5 F (37.5 C) (11/12 1857) Temp Source: Oral (11/12 1857) BP: 109/61 (11/13 0000) Pulse Rate: 84 (11/13 0000)  Labs: Recent Labs    04/07/20 2056 04/07/20 2235  HGB 13.7  --   HCT 43.0  --   PLT 281  --   CREATININE 0.68  --   TROPONINIHS  --  2    Estimated Creatinine Clearance: 165.9 mL/min (by C-G formula based on SCr of 0.68 mg/dL).   Medical History: Past Medical History:  Diagnosis Date  . Asthma   . GERD (gastroesophageal reflux disease)   . Polycystic ovarian disease     Assessment: 29 yo F who presents with new PE.  She has multiple modifiable risk factors for VTE including BCPs for her PCOS, smoker, obesity, sedentary lifestyle.  Not currently on blood thinners.   CBC WNL.   Plan:  Eliquis 10mg  PO BID x7 days then 5mg  PO BID Patient educated on medication.  Questions answered.  Discount cards from manufacturer provided to patient.   34 PharmD 04/08/2020,12:07 AM

## 2020-04-08 NOTE — Discharge Instructions (Signed)
You were diagnosed with a blood clot in your lungs.  Take Eliquis as prescribed.  Do not miss any doses.  Follow-up with a primary care doctor.  If you do not have a primary care physician, make an appointment with the [facility] for follow-up

## 2020-04-11 ENCOUNTER — Other Ambulatory Visit: Payer: Self-pay

## 2020-04-11 ENCOUNTER — Encounter: Payer: Self-pay | Admitting: Medical

## 2020-04-11 ENCOUNTER — Ambulatory Visit (INDEPENDENT_AMBULATORY_CARE_PROVIDER_SITE_OTHER): Payer: 59 | Admitting: Medical

## 2020-04-11 ENCOUNTER — Telehealth: Payer: Self-pay | Admitting: Family Medicine

## 2020-04-11 VITALS — BP 128/70 | HR 85 | Ht 65.0 in | Wt 363.8 lb

## 2020-04-11 DIAGNOSIS — Z6841 Body Mass Index (BMI) 40.0 and over, adult: Secondary | ICD-10-CM

## 2020-04-11 DIAGNOSIS — R0600 Dyspnea, unspecified: Secondary | ICD-10-CM

## 2020-04-11 DIAGNOSIS — F17200 Nicotine dependence, unspecified, uncomplicated: Secondary | ICD-10-CM | POA: Diagnosis not present

## 2020-04-11 DIAGNOSIS — R079 Chest pain, unspecified: Secondary | ICD-10-CM

## 2020-04-11 DIAGNOSIS — I2699 Other pulmonary embolism without acute cor pulmonale: Secondary | ICD-10-CM | POA: Diagnosis not present

## 2020-04-11 MED ORDER — APIXABAN 5 MG PO TABS: 5.0000 mg | ORAL_TABLET | Freq: Two times a day (BID) | ORAL | 3 refills | Status: DC

## 2020-04-11 NOTE — Telephone Encounter (Signed)
Letter typed and sent to pt via email as requested.

## 2020-04-11 NOTE — Telephone Encounter (Signed)
Patient called and needs letter to return to work.  She states she is not completely alert since she is on the narcotics, she wants to return to work on Thursday.  Can you email letter to [email or web address]

## 2020-04-11 NOTE — Patient Instructions (Signed)
Recommendations:  STOP Tobacco!  Consider smoking cessation class through your employer  Do not take any more hormonal birth control  Make sure your gynecologist is aware of the recent blood clot in reference to your PCOS  Take the Eliquis blood thinner 10mg  twice daily for the first 7 days, then change to the 5mg  twice daily dose  You will likely need to be on Eliquis for 4-6 months  Let me know if you find out any other blood clot family history  Consider wearing compression hose over the counter  Move around throughout the day instead of prolonged sitting  If you have sudden massive headache, go to the emergency dept or call 911  If you have unusual bleeding then recheck or return right away  Lets see you back in 3 months, sooner if needed    Pulmonary Embolism  A pulmonary embolism (PE) is a sudden blockage or decrease of blood flow in one or both lungs. Most blockages come from a blood clot that forms in the vein of a lower leg, thigh, or arm (deep vein thrombosis, DVT) and travels to the lungs. A clot is blood that has thickened into a gel or solid. PE is a dangerous and life-threatening condition that needs to be treated right away. What are the causes? This condition is usually caused by a blood clot that forms in a vein and moves to the lungs. In rare cases, it may be caused by air, fat, part of a tumor, or other tissue that moves through the veins and into the lungs. What increases the risk? The following factors may make you more likely to develop this condition:  Experiencing a traumatic injury, such as breaking a hip or leg.  Having: ? A spinal cord injury. ? Orthopedic surgery, especially hip or knee replacement. ? Any major surgery. ? A stroke. ? DVT. ? Blood clots or blood clotting disease. ? Long-term (chronic) lung or heart disease. ? Cancer treated with chemotherapy. ? A central venous catheter.  Taking medicines that contain estrogen. These include  birth control pills and hormone replacement therapy.  Being: ? Pregnant. ? In the period of time after your baby is delivered (postpartum). ? Older than age 4. ? Overweight. ? A smoker, especially if you have other risks. What are the signs or symptoms? Symptoms of this condition usually start suddenly and include:  Shortness of breath during activity or at rest.  Coughing, coughing up blood, or coughing up blood-tinged mucus.  Chest pain that is often worse with deep breaths.  Rapid or irregular heartbeat.  Feeling light-headed or dizzy.  Fainting.  Feeling anxious.  Fever.  Sweating.  Pain and swelling in a leg. This is a symptom of DVT, which can lead to PE. How is this diagnosed? This condition may be diagnosed based on:  Your medical history.  A physical exam.  Blood tests.  CT pulmonary angiogram. This test checks blood flow in and around your lungs.  Ventilation-perfusion scan, also called a lung VQ scan. This test measures air flow and blood flow to the lungs.  An ultrasound of the legs. How is this treated? Treatment for this condition depends on many factors, such as the cause of your PE, your risk for bleeding or developing more clots, and other medical conditions you have. Treatment aims to remove, dissolve, or stop blood clots from forming or growing larger. Treatment may include:  Medicines, such as: ? Blood thinning medicines (anticoagulants) to stop clots from forming. ?  Medicines that dissolve clots (thrombolytics).  Procedures, such as: ? Using a flexible tube to remove a blood clot (embolectomy) or to deliver medicine to destroy it (catheter-directed thrombolysis). ? Inserting a filter into a large vein that carries blood to the heart (inferior vena cava). This filter (vena cava filter) catches blood clots before they reach the lungs. ? Surgery to remove the clot (surgical embolectomy). This is rare. You may need a combination of immediate,  long-term (up to 3 months after diagnosis), and extended (more than 3 months after diagnosis) treatments. Your treatment may continue for several months (maintenance therapy). You and your health care provider will work together to choose the treatment program that is best for you. Follow these instructions at home: Medicines  Take over-the-counter and prescription medicines only as told by your health care provider.  If you are taking an anticoagulant medicine: ? Take the medicine every day at the same time each day. ? Understand what foods and drugs interact with your medicine. ? Understand the side effects of this medicine, including excessive bruising or bleeding. Ask your health care provider or pharmacist about other side effects. General instructions  Wear a medical alert bracelet or carry a medical alert card that says you have had a PE and lists what medicines you take.  Ask your health care provider when you may return to your normal activities. Avoid sitting or lying for a long time without moving.  Maintain a healthy weight. Ask your health care provider what weight is healthy for you.  Do not use any products that contain nicotine or tobacco, such as cigarettes, e-cigarettes, and chewing tobacco. If you need help quitting, ask your health care provider.  Talk with your health care provider about any travel plans. It is important to make sure that you are still able to take your medicine while on trips.  Keep all follow-up visits as told by your health care provider. This is important. Contact a health care provider if:  You missed a dose of your blood thinner medicine. Get help right away if:  You have: ? New or increased pain, swelling, warmth, or redness in an arm or leg. ? Numbness or tingling in an arm or leg. ? Shortness of breath during activity or at rest. ? A fever. ? Chest pain. ? A rapid or irregular heartbeat. ? A severe headache. ? Vision changes. ? A  serious fall or accident, or you hit your head. ? Stomach (abdominal) pain. ? Blood in your vomit, stool, or urine. ? A cut that will not stop bleeding.  You cough up blood.  You feel light-headed or dizzy.  You cannot move your arms or legs.  You are confused or have memory loss. These symptoms may represent a serious problem that is an emergency. Do not wait to see if the symptoms will go away. Get medical help right away. Call your local emergency services (911 in the [place] Do not drive yourself to the hospital. Summary  A pulmonary embolism (PE) is a sudden blockage or decrease of blood flow in one or both lungs. PE is a dangerous and life-threatening condition that needs to be treated right away.  Treatments for this condition usually include medicines to thin your blood (anticoagulants) or medicines to break apart blood clots (thrombolytics).  If you are given blood thinners, it is important to take the medicine every day at the same time each day.  Understand what foods and drugs interact with any medicines that  you are taking.  If you have signs of PE or DVT, call your local emergency services (911 in the [place] This information is not intended to replace advice given to you by your health care provider. Make sure you discuss any questions you have with your health care provider. Document Revised: 02/18/2018 Document Reviewed: 02/18/2018 Elsevier Patient Education  2020 [employer].

## 2020-04-11 NOTE — Progress Notes (Signed)
Subjective:  Tasha Hughes is a 29 y.o. female who presents for Chief Complaint  Patient presents with  . New Patient (Initial Visit)    blood clots in lung      Here as a new patient to establish care and to follow-up on recent pulmonary embolism diagnosis.   Was seeing [facility] prior.  She has underlying history of headaches, PCOS, prediabetes ,chronic back pain, GERD, allergies.    Here for f/u from hospital visit.  Had emergency dept visit 04/07/20 with chest wall and shoulder pain,  Went to the ED by EMS.   Was diagnosed with PE.  She was at that time on birth control.  She is a smoker.  Denies recent surgery, no recent immobilization, but sits for long hours on the job.  No personal or family hx/o clotting disorder.  No personal history of cancer.    Was initiated on Eliquis.   OCP was discontinued.   She has cut down on tobacco recently since the hospital visit.    Is taking Metformin for prediabetes and PCOS, through gynecology  She had Covid vaccine 03/26/20, pfizer, just a few weeks before the clot diagnosis.     Needs note for work exempting her from repeat covid vaccines  otherwise no c/o   No other aggravating or relieving factors.    No other c/o.  Past Medical History:  Diagnosis Date  . Allergy   . Chronic back pain   . GERD (gastroesophageal reflux disease)   . Polycystic ovarian disease    Current Outpatient Medications on File Prior to Visit  Medication Sig Dispense Refill  . APIXABAN (ELIQUIS) VTE STARTER PACK (10MG  AND 5MG ) Take as directed on package: start with two-5mg  tablets twice daily for 7 days. On day 8, switch to one-5mg  tablet twice daily. 1 each 0  . EPINEPHrine 0.3 mg/0.3 mL IJ SOAJ injection Inject 0.3 mLs (0.3 mg total) into the muscle as needed for anaphylaxis. 2 each 1  . metFORMIN (GLUCOPHAGE) 500 MG tablet Take 500 mg by mouth every evening.     oxycodone (OXY-IR) 5 MG capsule Take 1 capsule (5 mg total) by mouth every 6 (six)  hours as needed. 10 capsule 0  . AUROVELA FE 1/20 1-20 MG-MCG tablet Take 1 tablet by mouth daily. (Patient not taking: Reported on 04/11/2020)    . [DISCONTINUED] diphenhydrAMINE (BENADRYL) 25 mg capsule Take 1 capsule (25 mg total) by mouth every 6 (six) hours as needed for itching. (Patient not taking: Reported on 04/07/2020) 20 capsule 0   No current facility-administered medications on file prior to visit.    The following portions of the patient's history were reviewed and updated as appropriate: allergies, current medications, past family history, past medical history, past social history, past surgical history and problem list.  ROS Otherwise as in subjective above   Objective: BP 128/70   Pulse 85   Ht 5\' 5"  (1.651 m)   Wt (!) 363 lb 12.8 oz (165 kg)   LMP 04/07/2020 (Within Days)   SpO2 97%   BMI 60.54 kg/m   General appearance: alert, no distress, well developed, well nourished, African American female Neck: supple, no lymphadenopathy, no thyromegaly, no masses Heart: RRR, normal S1, S2, no murmurs Lungs: CTA bilaterally, no wheezes, rhonchi, or rales Pulses: 2+ radial pulses, 2+ pedal pulses, normal cap refill Ext: no edema Legs nontender, no asymmetry, no calve or leg swelling    CT angio chest 04/07/20 IMPRESSION: 1. Bilateral segmental  pulmonary emboli. No evidence of right heart strain. 2. Mild cardiomegaly.    Assessment: Encounter Diagnoses  Name Primary?  . Other acute pulmonary embolism, unspecified whether acute cor pulmonale present (HCC) Yes  . Smoker   . BMI 60.0-69.9, adult (HCC)   . Dyspnea, unspecified type   . Chest pain, unspecified type      Plan: I reviewed her 04/07/2020 emergency department notes, CT chest, labs, chest x-ray, and medicines reconciled.  We discussed possible causes of PE.  She had Covid vaccine for the first time less than 2 weeks before the blood clot diagnosis.  She also was a smoker on birth control, obese.  No  family or self history of prior DVT/PE  She has since stopped contraception and has cut significantly back on tobacco.  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  We discussed diagnosis of blood clot, possible risks and complications, treatment recommendations and follow-up  We discussed the following recommendations  Recommendations:  STOP Tobacco!  Consider smoking cessation class through your employer  Do not take any more hormonal birth control  Make sure your gynecologist is aware of the recent blood clot in reference to your PCOS  Take the Eliquis blood thinner 10mg  twice daily for the first 7 days, then change to the 5mg  twice daily dose  You will likely need to be on Eliquis for 4-6 months  Let me know if you find out any other blood clot family history  Consider wearing compression hose over the counter  Move around throughout the day instead of prolonged sitting  If you have sudden massive headache, go to the emergency dept or call 911  If you have unusual bleeding then recheck or return right away  Lets see you back in 3 months, sooner if needed   Tasha Hughes was seen today for new patient (initial visit).  Diagnoses and all orders for this visit:  Other acute pulmonary embolism, unspecified whether acute cor pulmonale present (HCC)  Smoker  BMI 60.0-69.9, adult (HCC)  Dyspnea, unspecified type  Chest pain, unspecified type  Other orders -     apixaban (ELIQUIS) 5 MG TABS tablet; Take 1 tablet (5 mg total) by mouth 2 (two) times daily.  we will request prior records  Follow up: 44mo, sooner prn

## 2020-04-11 NOTE — Telephone Encounter (Signed)
That is fine.  Please send letter/write letter

## 2020-04-12 ENCOUNTER — Telehealth: Payer: Self-pay | Admitting: Medical

## 2020-04-12 NOTE — Telephone Encounter (Signed)
Pt emailed me a form from her job that needs to be completed about the Covid Vaccine. Pt needs form by 11/30. When form is complete please return back to me so I can email it back to her. Form will be in red folder on Genera's desk.

## 2020-04-14 NOTE — Telephone Encounter (Signed)
Copy and return form

## 2020-04-19 NOTE — Telephone Encounter (Signed)
done

## 2020-05-08 ENCOUNTER — Telehealth: Payer: Self-pay | Admitting: Medical

## 2020-05-08 NOTE — Telephone Encounter (Signed)
Records received from [facility]

## 2020-05-17 ENCOUNTER — Telehealth: Payer: Self-pay | Admitting: Medical

## 2020-05-17 NOTE — Telephone Encounter (Signed)
Tasha Hughes this is being placed on your desk.

## 2020-05-17 NOTE — Telephone Encounter (Signed)
FMLA forms dropped off. They states insurance requiring additional info. They just received yesterday and insurance is requesting by 05/21/2020. Pt can be reached at [phone].

## 2020-05-29 NOTE — Telephone Encounter (Signed)
Copy /scan and fax back form, charge usual FMLA feel.   Maybe add cover sheet stating that this is the soonest the form could be returned to holidays and provider out sick during the holidays which delayed form getting back sooner.

## 2020-06-20 ENCOUNTER — Other Ambulatory Visit: Payer: Self-pay | Admitting: Internal Medicine

## 2020-07-07 ENCOUNTER — Other Ambulatory Visit: Payer: Self-pay

## 2020-07-07 ENCOUNTER — Ambulatory Visit (INDEPENDENT_AMBULATORY_CARE_PROVIDER_SITE_OTHER): Payer: 59 | Admitting: Medical

## 2020-07-07 ENCOUNTER — Encounter: Payer: Self-pay | Admitting: Medical

## 2020-07-19 ENCOUNTER — Telehealth: Payer: Self-pay | Admitting: Medical

## 2020-07-19 NOTE — Telephone Encounter (Signed)
Called [facility] in reference to a Medical records request. Per facility no records available

## 2020-07-21 ENCOUNTER — Telehealth (INDEPENDENT_AMBULATORY_CARE_PROVIDER_SITE_OTHER): Payer: 59 | Admitting: Medical

## 2020-07-21 ENCOUNTER — Other Ambulatory Visit: Payer: Self-pay

## 2020-07-21 ENCOUNTER — Encounter: Payer: Self-pay | Admitting: Medical

## 2020-07-21 VITALS — Ht 64.5 in | Wt 361.0 lb

## 2020-07-21 DIAGNOSIS — Z86711 Personal history of pulmonary embolism: Secondary | ICD-10-CM | POA: Insufficient documentation

## 2020-07-21 DIAGNOSIS — Z6841 Body Mass Index (BMI) 40.0 and over, adult: Secondary | ICD-10-CM

## 2020-07-21 DIAGNOSIS — R9389 Abnormal findings on diagnostic imaging of other specified body structures: Secondary | ICD-10-CM | POA: Insufficient documentation

## 2020-07-21 DIAGNOSIS — R0602 Shortness of breath: Secondary | ICD-10-CM | POA: Diagnosis not present

## 2020-07-21 DIAGNOSIS — F17200 Nicotine dependence, unspecified, uncomplicated: Secondary | ICD-10-CM | POA: Diagnosis not present

## 2020-07-21 NOTE — Progress Notes (Signed)
Done

## 2020-07-21 NOTE — Progress Notes (Signed)
This visit type was conducted due to national recommendations for restrictions regarding the COVID-19 Pandemic (e.g. social distancing) in an effort to limit this patient's exposure and mitigate transmission in our community.  Due to their co-morbid illnesses, this patient is at least at moderate risk for complications without adequate follow up.  This format is felt to be most appropriate for this patient at this time.    Documentation for virtual audio and video telecommunications through [place] encounter:  The patient was located at home. The provider was located in the office. The patient did consent to this visit and is aware of possible charges through their insurance for this visit.  The other persons participating in this telemedicine service were mother. Time spent on call was 26 minutes and in review of previous records >35 minutes total.  This virtual service is not related to other E/M service within previous 7 days.    Subjective:  Tasha Hughes is a 30 y.o. female who presents for Chief Complaint  Patient presents with  . Follow-up    3 month follow up on Pulmonary Embolism. Cold symptoms started 07/17/20. Has SOB when taking Eliquis and Metformin at the same time.      Virtual consult for recheck.    I saw her as a new patient in 03/2020 for hospital f/u after pulmonary embolism.  She was a smoker on birth control and obese.  No related trauma, injury surgery or long travel.  This was her first every PE.    She still continues to be a little SOB.   No hemoptysis, no fever, no phlegm.   No calve pain or swelling.  She was smoking 1ppd, still smoking about 6 cigarettes daily.  Takes Metformin for PCOS and prediabetes.  prescribed by gynecology  Not on birth control, and no sexual activity in the past year.  At the time she was diagnosed with PE, she had just received 1st covid vaccine 12 days earlier.    She has quit tobacco before cold [place].  No prior  Wellbutrin or chantix, 1ppd down to 6 cigarettes,  compliant with eliquis still.   No hx/o asthma  BP normal at home,  Today BP 128/82  No other aggravating or relieving factors.    No other c/o.  The following portions of the patient's history were reviewed and updated as appropriate: allergies, current medications, past family history, past medical history, past social history, past surgical history and problem list.  ROS Otherwise as in subjective above  Objective: Ht 5' 4.5" (1.638 m)   Wt (!) 361 lb (163.7 kg)   BMI 61.01 kg/m   General appearance: alert, no distress, well developed, well nourished No dyspnea on exam and answering questions in complete sentences   Assessment: Encounter Diagnoses  Name Primary?  . SOB (shortness of breath) Yes  . History of pulmonary embolism   . Smoker   . BMI 60.0-69.9, adult (HCC)   . Abnormal chest x-ray      Plan: Pulmonary embolism diagnosed November 2021.  At that time she had been on birth control and was smoker with underlying obesity.  She had also had the COVID vaccine 12 days prior to the pulmonary embolism diagnosis.  She promptly stopped birth control.  She is down to 6 cigarettes daily but still smokes.  She will continue Eliquis for now.  I would like her to be on Eliquis a full 4 months before we stop this.  She has an appointment with me  for physical next month.  She has been on Eliquis approximately 3 months at this time.  She continues to feel shortness of breath.  I asked her to get a chest x-ray to rule out any new concerns.  Shortness of breath could be related to deconditioning, obesity, ongoing complications from the pulmonary embolism, or other.  She did have cardiomegaly on chest x-ray back in November.   I am going to refer her to cardiology for evaluation given cardiomegaly on x-ray, pulmonary embolism shortly after receiving COVID vaccine, obesity and preop clearance in the event she ultimately has weight  loss surgery  Referral to bariatric clinic for consult for weight management including nutrition and counseling  I recommend she continue efforts to quit smoking completely.  She currently declines Wellbutrin or Chantix medication.  I recommend she call the 1 800 quit NOW hotline.  Follow-up soon as planned for physical  She also has appointment coming up soon with her gynecology   Analiya was seen today for follow-up.  Diagnoses and all orders for this visit:  SOB (shortness of breath) -     DG Chest 2 View; Future -     Ambulatory referral to Cardiology  History of pulmonary embolism -     DG Chest 2 View; Future -     Ambulatory referral to Cardiology  Smoker -     DG Chest 2 View; Future -     Ambulatory referral to Cardiology  BMI 60.0-69.9, adult [facility]) -     Ambulatory referral to Cardiology -     Amb Referral to Bariatric Surgery  Abnormal chest x-ray -     Ambulatory referral to Cardiology    Follow up: March for physical as planned

## 2020-07-21 NOTE — Patient Instructions (Signed)
Please go to [facility] for your chest xray.   Their hours are 8am - 4:30 pm Monday - Friday.  Take your insurance card with you.  [facility] [phone]  [address], [address] [place], [place] [address]  [address] [place], [place] [address]   Referrals: Cardiology for evaluation [facility] for weight loss consult including nutrition counseling   Summary from today:  Pulmonary embolism diagnosed November 2021.  At that time she had been on birth control and was smoker with underlying obesity.  She had also had the COVID vaccine 12 days prior to the pulmonary embolism diagnosis.  She promptly stopped birth control.  She is down to 6 cigarettes daily but still smokes.  She will continue Eliquis for now.  I would like her to be on Eliquis a full 4 months before we stop this.  She has an appointment with me for physical next month.  She has been on Eliquis approximately 3 months at this time.  She continues to feel shortness of breath.  I asked her to get a chest x-ray to rule out any new concerns.  Shortness of breath could be related to deconditioning, obesity, ongoing complications from the pulmonary embolism, or other.  She did have cardiomegaly on chest x-ray back in November.   I am going to refer her to cardiology for evaluation given cardiomegaly on x-ray, pulmonary embolism shortly after receiving COVID vaccine, obesity and preop clearance in the event she ultimately has weight loss surgery  Referral to bariatric clinic for consult for weight management including nutrition and counseling  I recommend she continue efforts to quit smoking completely.  She currently declines Wellbutrin or Chantix medication.  I recommend she call the 1 800 quit NOW hotline.  Follow-up soon as planned for physical  She also has appointment coming up soon with her gynecology

## 2020-07-25 NOTE — Progress Notes (Signed)
done

## 2020-07-27 ENCOUNTER — Ambulatory Visit: Payer: 59 | Admitting: Cardiology

## 2020-08-04 ENCOUNTER — Ambulatory Visit: Payer: 59 | Admitting: Cardiology

## 2020-08-07 ENCOUNTER — Ambulatory Visit: Payer: 59 | Admitting: Cardiology

## 2020-08-07 ENCOUNTER — Telehealth: Payer: Self-pay | Admitting: Family Medicine

## 2020-08-07 NOTE — Telephone Encounter (Signed)
PT called and wants to know why FMLA was cut back to Feb with [employer]?  Pt stopped Metformin on Feb 25  Pt stopped Eliquis on the 1st but was hurting so bad she started back on Friday.  Pt has not had xray and states she may have to reschedule her cardiology appointment if FMLA is not fixed.  Please call pt [phone]

## 2020-08-07 NOTE — Telephone Encounter (Signed)
Patient called back and advised RECERTIFICATION is required for the FMLA and [employer] changed the dates themselves.  We just need to recertify.

## 2020-08-07 NOTE — Telephone Encounter (Signed)
I have reviewed the message.  I would need to see the FMLA forms as I do not see them scanned in.  I cannot recall what I wrote on there without looking at the forms  So I am not sure about the concern.  We did make referrals to bariatric weight loss clinic and cardiology.  It does not appear that she has seen cardiology yet  Let me see the forms and I can try to address her question

## 2020-08-07 NOTE — Progress Notes (Deleted)
Cardiology Office Note:    Date:  08/07/2020   ID:  Tasha Hughes, DOB 11-21-1990, MRN [id number]  PCP:  Jac Canavan, PA-C  Cardiologist:  No primary care provider on file.  Electrophysiologist:  None   Referring MD: Jac Canavan, PA-C   No chief complaint on file. ***  History of Present Illness:    Tasha Hughes is a 30 y.o. female with a hx of pulmonary embolism, tobacco use who is referred by Crosby Oyster, PA for evaluation of cardiomegaly.  She was diagnosed with PE November 2021.  At the time she had been on birth control and was smoking.  She had had her Covid vaccine 12 days prior.  Cardiomegaly noted on CTPA.  Past Medical History:  Diagnosis Date  . Allergy   . Chronic back pain   . GERD (gastroesophageal reflux disease)   . Polycystic ovarian disease     Past Surgical History:  Procedure Laterality Date  . TONSILLECTOMY      Current Medications: No outpatient medications have been marked as taking for the 08/07/20 encounter (Appointment) with Little Ishikawa, MD.     Allergies:   Bee venom, Other, Penicillins, Robitussin [guaifenesin], Shrimp [shellfish allergy], and Sudafed [pseudoephedrine hcl]   Social History   Socioeconomic History  . Marital status: Single    Spouse name: Not on file  . Number of children: Not on file  . Years of education: Not on file  . Highest education level: Not on file  Occupational History  . Not on file  Tobacco Use  . Smoking status: Current Every Day Smoker    Packs/day: 1.00    Years: 6.00    Pack years: 6.00    Types: Cigarettes  . Smokeless tobacco: Never Used  Vaping Use  . Vaping Use: Never used  Substance and Sexual Activity  . Alcohol use: Yes    Comment: 4  . Drug use: No  . Sexual activity: Not Currently  Other Topics Concern  . Not on file  Social History Narrative   Lives with mother.  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  ROS:   Please see the history of present illness.    *** All other systems reviewed and are negative.  EKGs/Labs/Other Studies Reviewed:    The following studies were reviewed today: ***  EKG:  EKG is *** ordered today.  The ekg ordered today demonstrates ***  Recent Labs: 04/07/2020: BUN 21; Creatinine, Ser 0.68; Hemoglobin 13.7; Platelets 281; Potassium 3.7; Sodium 140  Recent Lipid Panel No results found for: CHOL, TRIG, HDL, CHOLHDL, VLDL, LDLCALC, LDLDIRECT  Physical Exam:    VS:  There were no vitals taken for this visit.    Wt Readings from Last 3 Encounters:  07/21/20 (!) 361 lb (163.7 kg)  07/07/20 (!) 361 lb (163.7 kg)  04/11/20 (!) 363 lb 12.8 oz (165 kg)     GEN: *** Well nourished, well developed in no acute distress HEENT: Normal NECK: No JVD; No carotid bruits LYMPHATICS: No lymphadenopathy CARDIAC: ***RRR, no murmurs, rubs, gallops RESPIRATORY:  Clear to auscultation without rales, wheezing or rhonchi  ABDOMEN:  Soft, non-tender, non-distended MUSCULOSKELETAL:  No edema; No deformity  SKIN: Warm and dry NEUROLOGIC:  Alert and oriented x 3 PSYCHIATRIC:  Normal affect   ASSESSMENT:    No diagnosis found. PLAN:    Cardiomegaly: Noted on CTPA, we will check echocardiogram  PE: diagnosed with PE November 2021.  At the time she had been on birth control and was smoking.  She had had her Covid vaccine 12 days prior.  On Eliquis 5 mg twice daily  Tobacco use: Patient counseled on the risk of tobacco use and cessation strongly recommended.  RTC in***  Medication Adjustments/Labs and Tests Ordered: Current medicines are reviewed at length with the patient today.   Concerns regarding medicines are outlined above.  No orders of the defined types were placed in this encounter.  No orders of the defined types were placed in this encounter.   There are no Patient Instructions on file for this visit.   Signed, Little Ishikawa, MD  08/07/2020 6:35 AM    [facility]

## 2020-08-07 NOTE — Telephone Encounter (Signed)
Form given back to [facility] for review.

## 2020-08-08 ENCOUNTER — Telehealth: Payer: Self-pay | Admitting: Family Medicine

## 2020-08-08 NOTE — Telephone Encounter (Signed)
Called pt and let her know we only received 4 pages of a 15 page form request.  She will ask them to resend. I had emailed and called [employer] yesterday to ask for them to resend and nothing received.

## 2020-08-17 DIAGNOSIS — I517 Cardiomegaly: Secondary | ICD-10-CM | POA: Insufficient documentation

## 2020-08-17 NOTE — Progress Notes (Signed)
Cardiology Office Note   Date:  08/18/2020   ID:  Tasha Hughes, DOB [birth year], MRN [id number]  PCP:  Jac Canavan, PA-C  Cardiologist:   Rollene Rotunda, MD Referring:  Jac Canavan, PA-C  Chief Complaint  Patient presents with  . Shortness of Breath      History of Present Illness: Tasha Hughes is a 30 y.o. female who is referred by Jac Canavan, PA-C for evaluation of SOB and cardiomegaly on CT.    She has a history of PE.   This happened shortly after getting the COVID vaccine.   She was seen in the ED in Nov for this.  I reviewed these records for this visit.   Her risk appeared to possibly be BCP.    She was short of breath back in November when this was happening.  However, she actually thinks it is better now.  She has a sedentary call center job but she does her chores of daily living which includes going to the grocery store.  She thinks she could walk 100 yards without being significantly short of breath.  She is not having any resting shortness of breath, PND or orthopnea.  She says that some of her breathing is gotten better since she quit smoking in February.  She denies any chest pressure, neck or arm discomfort.  She has not had any palpitations, presyncope or syncope.  He said her shortness of breath was very coincident with the diagnosis of PE.  She is otherwise had no prior cardiac history.  She is being considered for bariatric surgery.  Past Medical History:  Diagnosis Date  . Allergy   . Chronic back pain   . GERD (gastroesophageal reflux disease)   . Polycystic ovarian disease     Past Surgical History:  Procedure Laterality Date  . TONSILLECTOMY       Current Outpatient Medications  Medication Sig Dispense Refill  . apixaban (ELIQUIS) 5 MG TABS tablet Take 1 tablet (5 mg total) by mouth 2 (two) times daily. 60 tablet 3  . EPINEPHrine 0.3 mg/0.3 mL IJ SOAJ injection Inject 0.3 mLs (0.3 mg total) into the muscle as needed for  anaphylaxis. 2 each 1  . metFORMIN (GLUCOPHAGE) 500 MG tablet Take 500 mg by mouth every evening.     . tretinoin (RETIN-A) 0.025 % cream SMARTSIG:1 Topical Every Night     No current facility-administered medications for this visit.    Allergies:   Bee venom, Other, Penicillins, Robitussin [guaifenesin], Shrimp [shellfish allergy], and Sudafed [pseudoephedrine hcl]    Social History:  The patient  reports that she has quit smoking. Her smoking use included cigarettes. She has a 6.00 pack-year smoking history. She has never used smokeless tobacco. She reports current alcohol use. She reports that she does not use drugs.   Family History:  The patient's family history includes Diabetes in her father; Hypertension in her mother.    ROS:  Please see the history of present illness.   Otherwise, review of systems are positive for none.   All other systems are reviewed and negative.    PHYSICAL EXAM: VS:  BP 107/68   Pulse 78   Ht 5' 4.5" (1.638 m)   Wt (!) 362 lb 9.6 oz (164.5 kg)   BMI 61.28 kg/m  , BMI Body mass index is 61.28 kg/m. GENERAL:  Well appearing HEENT:  Pupils equal round and reactive, fundi not visualized, oral mucosa unremarkable NECK:  No jugular venous  distention, waveform within normal limits, carotid upstroke brisk and symmetric, no bruits, no thyromegaly LYMPHATICS:  No cervical, inguinal adenopathy LUNGS:  Clear to auscultation bilaterally BACK:  No CVA tenderness CHEST:  Unremarkable HEART:  PMI not displaced or sustained,S1 and S2 within normal limits, no S3, no S4, no clicks, no rubs, no murmurs ABD:  Flat, positive bowel sounds normal in frequency in pitch, no bruits, no rebound, no guarding, no midline pulsatile mass, no hepatomegaly, no splenomegaly EXT:  2 plus pulses throughout, no edema, no cyanosis no clubbing SKIN:  No rashes no nodules NEURO:  Cranial nerves II through XII grossly intact, motor grossly intact throughout PSYCH:  Cognitively intact,  oriented to person place and time    EKG:  EKG is ordered today. The ekg ordered today demonstrates sinus rhythm, rate 70, axis within normal limits, intervals within normal limits, no acute ST-T wave changes.   Recent Labs: 04/07/2020: BUN 21; Creatinine, Ser 0.68; Hemoglobin 13.7; Platelets 281; Potassium 3.7; Sodium 140    Lipid Panel No results found for: CHOL, TRIG, HDL, CHOLHDL, VLDL, LDLCALC, LDLDIRECT    Wt Readings from Last 3 Encounters:  08/18/20 (!) 362 lb 9.6 oz (164.5 kg)  07/21/20 (!) 361 lb (163.7 kg)  07/07/20 (!) 361 lb (163.7 kg)      Other studies Reviewed: Additional studies/ records that were reviewed today include: ER records. Review of the above records demonstrates:  Please see elsewhere in the note.     ASSESSMENT AND PLAN:  SOB: This seems to be mild and probably related to her pulmonary embolism.  It seems to have improved since she has been on anticoagulation.  Because of the cardiomegaly and this complaint and PE I will follow up with an echocardiogram.  HISTORY OF PE: I think this was secondary likely to birth control pills plus or minus the vaccine.  I think 3 to 6 months would be adequate therapy per her primary providers.  I do not think she needs indefinite anticoagulation.  OBESITY: She is considering bariatric surgery.  See below.  TOBACCO:  She quit!!  PREOP: Provided the echocardiogram is okay the patient would be at acceptable risk for planned surgery.  Current medicines are reviewed at length with the patient today.  The patient does not have concerns regarding medicines.  The following changes have been made:  no change  Labs/ tests ordered today include:   Orders Placed This Encounter  Procedures  . EKG 12-Lead  . ECHOCARDIOGRAM COMPLETE     Disposition:   FU with me as needed.     Signed, Rollene Rotunda, MD  08/18/2020 2:07 PM    [facility]

## 2020-08-18 ENCOUNTER — Encounter: Payer: Self-pay | Admitting: Cardiology

## 2020-08-18 ENCOUNTER — Other Ambulatory Visit: Payer: Self-pay

## 2020-08-18 ENCOUNTER — Ambulatory Visit (INDEPENDENT_AMBULATORY_CARE_PROVIDER_SITE_OTHER): Payer: 59 | Admitting: Cardiology

## 2020-08-18 VITALS — BP 107/68 | HR 78 | Ht 64.5 in | Wt 362.6 lb

## 2020-08-18 DIAGNOSIS — I517 Cardiomegaly: Secondary | ICD-10-CM

## 2020-08-18 DIAGNOSIS — R0602 Shortness of breath: Secondary | ICD-10-CM

## 2020-08-18 NOTE — Patient Instructions (Signed)
Medication Instructions:  Your physician recommends that you continue on your current medications as directed. Please refer to the Current Medication list given to you today.  *If you need a refill on your cardiac medications before your next appointment, please call your pharmacy*   Lab Work: NONE ordered at this time of appointment   If you have labs (blood work) drawn today and your tests are completely normal, you will receive your results only by: Marland Kitchen MyChart Message (if you have MyChart) OR . A paper copy in the mail If you have any lab test that is abnormal or we need to change your treatment, we will call you to review the results.   Testing/Procedures: Your physician has requested that you have an echocardiogram. Echocardiography is a painless test that uses sound waves to create images of your heart. It provides your doctor with information about the size and shape of your heart and how well your heart's chambers and valves are working. This procedure takes approximately one hour. There are no restrictions for this procedure. This test is performed at [address], [place], [place] [address]   Please schedule for 3-4 weeks   Follow-Up: At [facility], you and your health needs are our priority.  As part of our continuing mission to provide you with exceptional heart care, we have created designated Provider Care Teams.  These Care Teams include your primary Cardiologist (physician) and Advanced Practice Providers (APPs -  Physician Assistants and Nurse Practitioners) who all work together to provide you with the care you need, when you need it.  Your next appointment:   As needed    The format for your next appointment:   In Person  Provider:   You may see Rollene Rotunda, MD or one of the following Advanced Practice Providers on your designated Care Team:    Theodore Demark, PA-C  Joni Reining, DNP, ANP    Other Instructions

## 2020-08-24 ENCOUNTER — Encounter: Payer: 59 | Admitting: Medical

## 2020-08-29 ENCOUNTER — Telehealth: Payer: Self-pay | Admitting: Medical

## 2020-08-29 NOTE — Telephone Encounter (Signed)
Please call patient about the FMLA forms I received  Looking back, we did initial FMLA forms in November given the shortness of breath and history of blood clot.  Those forms would carry her through till May 2022 based on the dates have put on there.  FMLA was completed due to shortness of breath related to the pulmonary embolism.  At this point she should have completed 3 to 4 months of anticoagulation  therapy with Eliquis, and she should be able to resume work as normal at this point.  So the paperwork I had done prior would end in May  So at this point there does not appear to be need for renewing FMLA  Also I recommend we go ahead and get her on the schedule for fasting physical and recheck.  I reviewed her recent cardiology notes.  They are planning an echocardiogram

## 2020-08-29 NOTE — Telephone Encounter (Signed)
Message has been sent to patient via mychart  

## 2020-09-19 ENCOUNTER — Telehealth: Payer: Self-pay | Admitting: Medical

## 2020-09-19 NOTE — Telephone Encounter (Signed)
Scanned made [place] a copy called and informed pt that forms was ready and that she would need to pay the 25.00 form fee before the forms could be sent out, she will be here Thursday to pay

## 2020-09-19 NOTE — Telephone Encounter (Signed)
FMLA paperwork has been completed.  Please SCAN copy for [place].  Return form to patient or employer as requested.    Charge the customary fee for FMLA form completion.  Make sure she has a follow up appt within next 60 days

## 2020-09-22 ENCOUNTER — Ambulatory Visit (HOSPITAL_COMMUNITY): Payer: 59 | Attending: Cardiovascular Disease

## 2020-09-22 ENCOUNTER — Other Ambulatory Visit: Payer: Self-pay

## 2020-09-22 DIAGNOSIS — R0602 Shortness of breath: Secondary | ICD-10-CM | POA: Diagnosis not present

## 2020-09-22 DIAGNOSIS — I517 Cardiomegaly: Secondary | ICD-10-CM | POA: Diagnosis not present

## 2020-09-22 LAB — ECHOCARDIOGRAM COMPLETE
Area-P 1/2: 3.36 cm2
S' Lateral: 3.3 cm

## 2021-02-26 ENCOUNTER — Other Ambulatory Visit: Payer: Self-pay | Admitting: Medical

## 2021-02-26 NOTE — Telephone Encounter (Signed)
Send 30 day supply and get him for f/u/med check

## 2021-04-13 ENCOUNTER — Encounter: Payer: Self-pay | Admitting: Internal Medicine

## 2021-05-02 ENCOUNTER — Encounter: Payer: Self-pay | Admitting: Internal Medicine

## 2021-05-03 ENCOUNTER — Telehealth (INDEPENDENT_AMBULATORY_CARE_PROVIDER_SITE_OTHER): Payer: 59 | Admitting: Medical

## 2021-05-03 ENCOUNTER — Other Ambulatory Visit: Payer: Self-pay

## 2021-05-03 VITALS — Wt 368.0 lb

## 2021-05-03 DIAGNOSIS — J069 Acute upper respiratory infection, unspecified: Secondary | ICD-10-CM | POA: Diagnosis not present

## 2021-05-03 DIAGNOSIS — H9203 Otalgia, bilateral: Secondary | ICD-10-CM | POA: Diagnosis not present

## 2021-05-03 DIAGNOSIS — H938X3 Other specified disorders of ear, bilateral: Secondary | ICD-10-CM

## 2021-05-03 MED ORDER — CLARITHROMYCIN 500 MG PO TABS: 500.0000 mg | ORAL_TABLET | Freq: Two times a day (BID) | ORAL | 0 refills | Status: DC

## 2021-05-03 NOTE — Progress Notes (Signed)
Subjective:     Patient ID: Tasha Hughes, female   DOB: 1990-12-09, 30 y.o.   MRN: [id number]  This visit type was conducted due to national recommendations for restrictions regarding the COVID-19 Pandemic (e.g. social distancing) in an effort to limit this patient's exposure and mitigate transmission in our community.  Due to their co-morbid illnesses, this patient is at least at moderate risk for complications without adequate follow up.  This format is felt to be most appropriate for this patient at this time.    Documentation for virtual audio and video telecommunications through [employer] encounter:  The patient was located at home. The provider was located in the office. The patient did consent to this visit and is aware of possible charges through their insurance for this visit.  The other persons participating in this telemedicine service were none. Time spent on call was 20 minutes and in review of previous records 20 minutes total.  This virtual service is not related to other E/M service within previous 7 days.   HPI Chief Complaint  Patient presents with   Ear Fullness    Bilateral ears feel full of water with itching in left ear. 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 Has Headache as well she has been taking BC powder for   She has several days feeling of pressure in both ears, worse on the left.  She had a pretty bad cold/respiratory tract infection around Thanksgiving.  At that time she had head pressure, sinus pressure, cough, runny nose, congestion.  Most of the symptoms have resolved except she still has some sinus pressure and ear pressure.  She is drinking a lot of water.  She has used some Flonase and Benadryl.  However she still has the symptoms. Has a whooshing sound in her ears and a dull headache as well.   No current fever, body aches or chills, no ear drainage.  No cough.  Both ears feel full.  Left one itches.  has some dizziness, no nausea or vomiting.   She has not had problems with ear wax needing ear lavage as an adult.  No other aggravating or relieving factors. No other complaint.  Past Medical History:  Diagnosis Date   Allergy    Chronic back pain    GERD (gastroesophageal reflux disease)    Polycystic ovarian disease    Current Outpatient Medications on File Prior to Visit  Medication Sig Dispense Refill   apixaban (ELIQUIS) 5 MG TABS tablet TAKE 1 TABLET(5 MG) BY MOUTH TWICE DAILY 60 tablet 0   EPINEPHrine 0.3 mg/0.3 mL IJ SOAJ injection Inject 0.3 mLs (0.3 mg total) into the muscle as needed for anaphylaxis. 2 each 1   metFORMIN (GLUCOPHAGE) 500 MG tablet Take 500 mg by mouth every evening.      tretinoin (RETIN-A) 0.025 % cream SMARTSIG:1 Topical Every Night     [DISCONTINUED] diphenhydrAMINE (BENADRYL) 25 mg capsule Take 1 capsule (25 mg total) by mouth every 6 (six) hours as needed for itching. (Patient not taking: Reported on 04/07/2020) 20 capsule 0   No current facility-administered medications on file prior to visit.     Review of Systems As in subjective    Objective:   Physical Exam Due to coronavirus pandemic stay at home measures, patient visit was virtual and they were not examined in person.   Wt (!) 368 lb (166.9 kg)   BMI 62.19 kg/m  General: Well developed well nourished no acute distress No congested sounding No labored  breathing or wheezing     Assessment:     Encounter Diagnoses  Name Primary?   Pressure sensation in both ears Yes   Recent upper respiratory tract infection    Otalgia of both ears        Plan:     She has several drug allergies.  Discussed limitations of virtual consult.  Advised that this would be better suited for in person visit but she was unable to do that.  Based on recent cold symptoms and current symptoms, we will empirically treat for possible eustachian tube dysfunction/ear infection.  She has not seen benefit with Flonase and Benadryl.  She is allergic to  guaifenesin and Sudafed.  Continue with good hydration.  Add medication below.  If possible come by for in person examination.  If not much improved over the next 3 to 4 days then recheck  Mailin was seen today for ear fullness.  Diagnoses and all orders for this visit:  Pressure sensation in both ears  Recent upper respiratory tract infection  Otalgia of both ears  Other orders -     clarithromycin (BIAXIN) 500 MG tablet; Take 1 tablet (500 mg total) by mouth 2 (two) times daily.  F/u prn

## 2021-05-18 IMAGING — CT CT ANGIO CHEST
2 of 6 series · 18 of 36 positions shown · IV contrast (omnipaque)
Comparison: 04/07/2020

CLINICAL DATA: Left shoulder pain, pain with inspiration

EXAM:
CT ANGIOGRAPHY CHEST WITH CONTRAST
TECHNIQUE: Multidetector CT imaging of the chest was performed using the
standard protocol during bolus administration of intravenous
contrast. Multiplanar CT image reconstructions and MIPs were
obtained to evaluate the vascular anatomy.
CONTRAST:  100mL OMNIPAQUE IOHEXOL 350 MG/ML SOLN

[Series 6: thins · axial · 0.62mm/px · z∈[-341,-79]mm · 17 of 296 slices shown]
[im 17/296  lung]
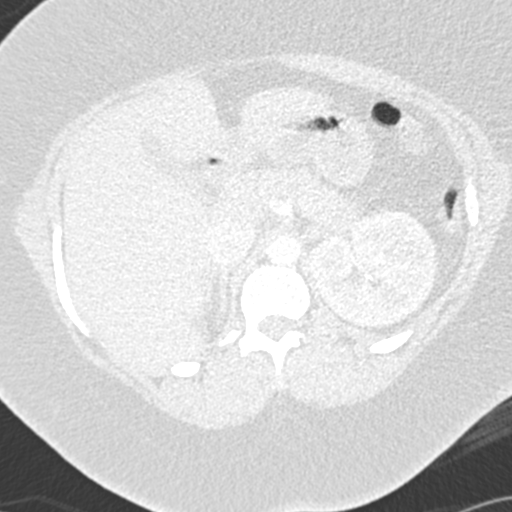
[im 33/296  mediastinal]
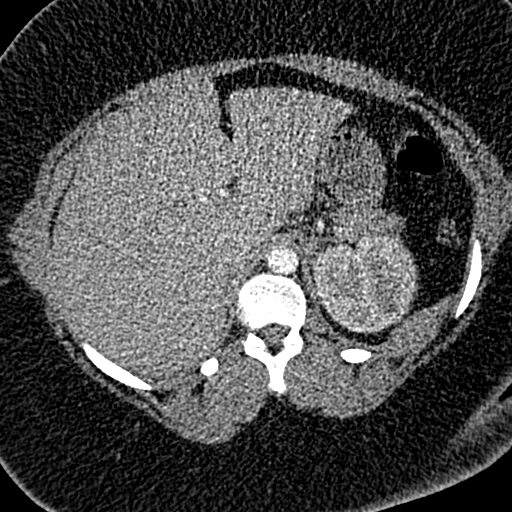
[im 50/296  lung]
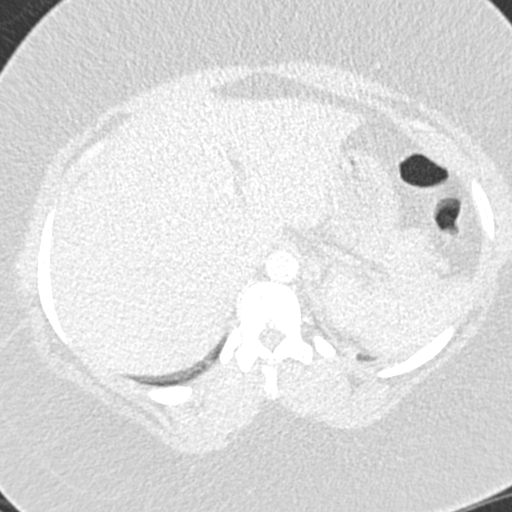
[im 66/296  mediastinal]
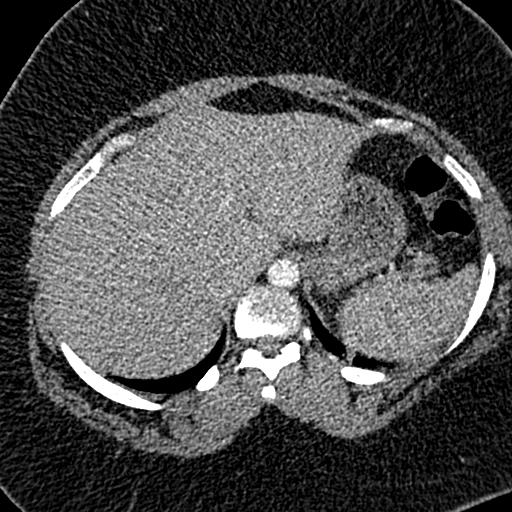
[im 82/296  lung]
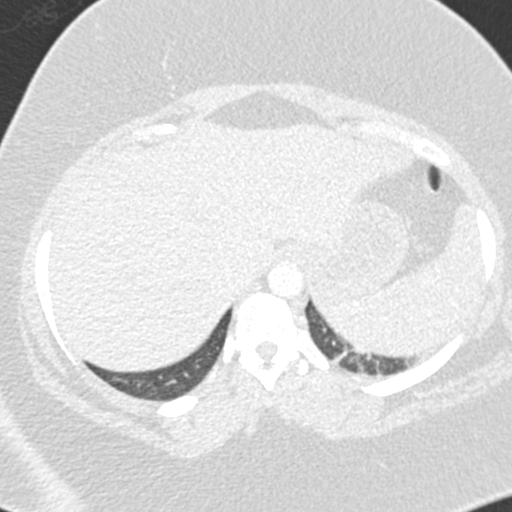
[im 99/296  mediastinal]
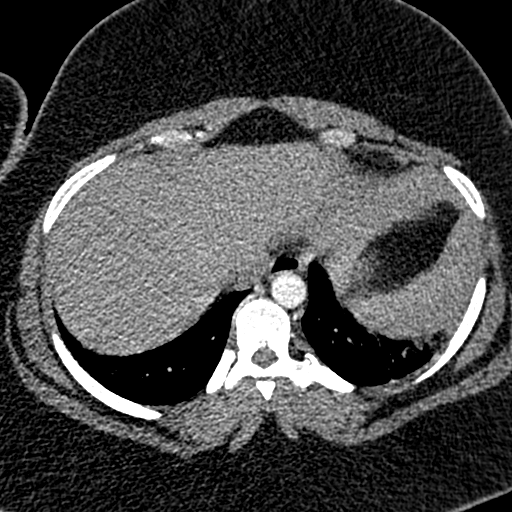
[im 115/296  lung]
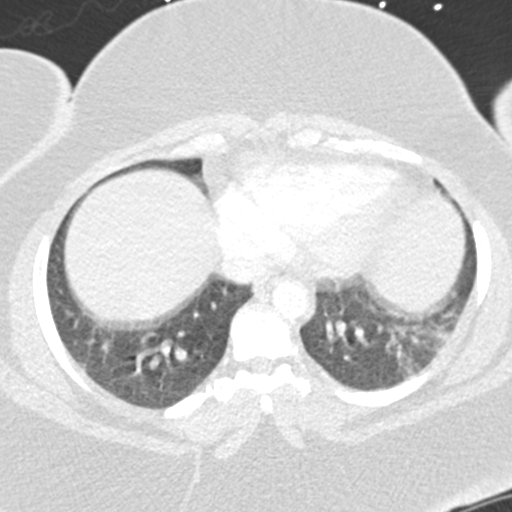
[im 132/296  mediastinal]
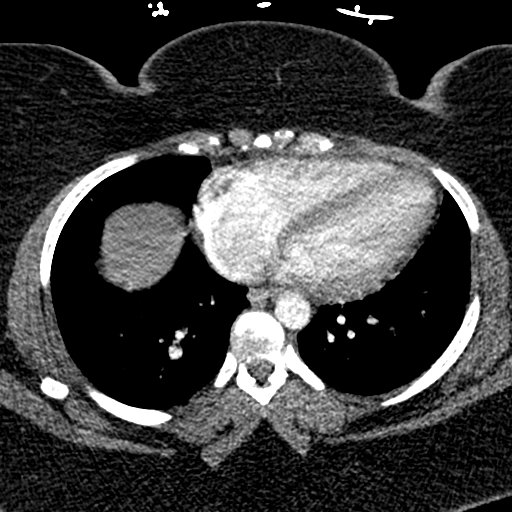
[im 148/296  lung]
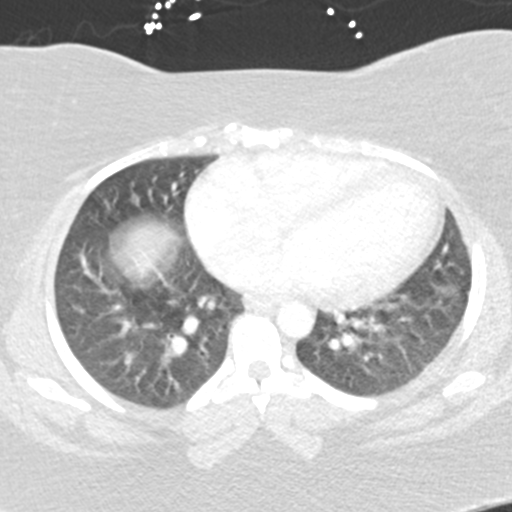
[im 164/296  mediastinal]
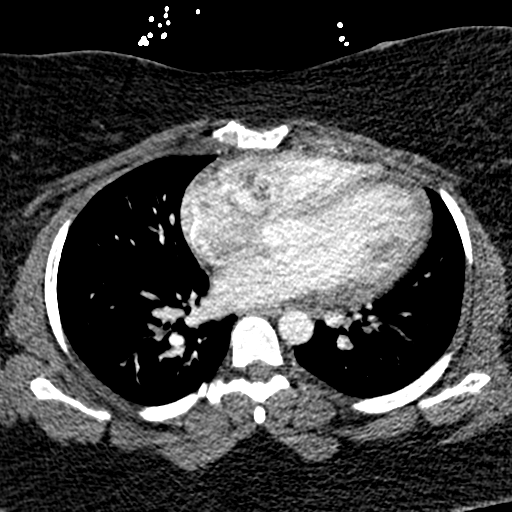
[im 181/296  lung]
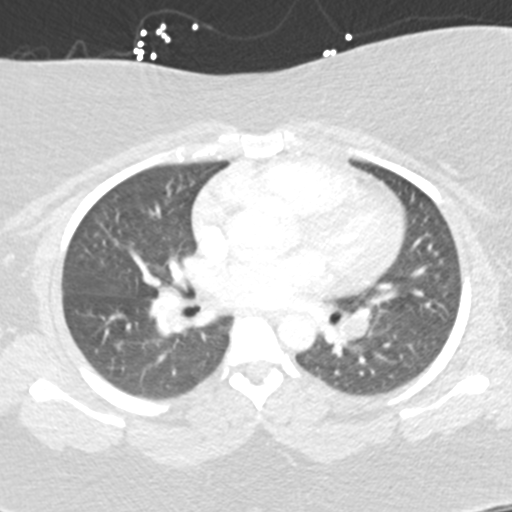
[im 197/296  mediastinal]
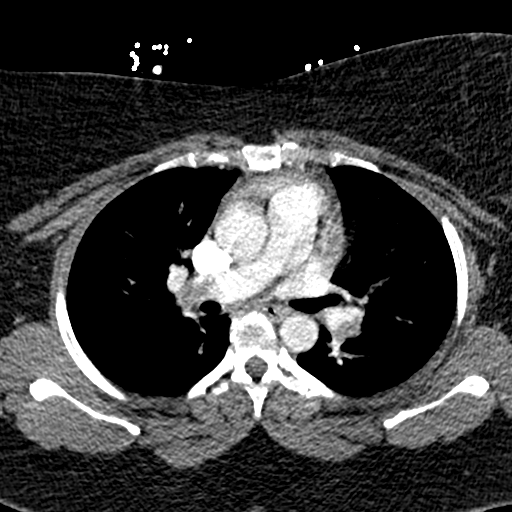
[im 214/296  lung]
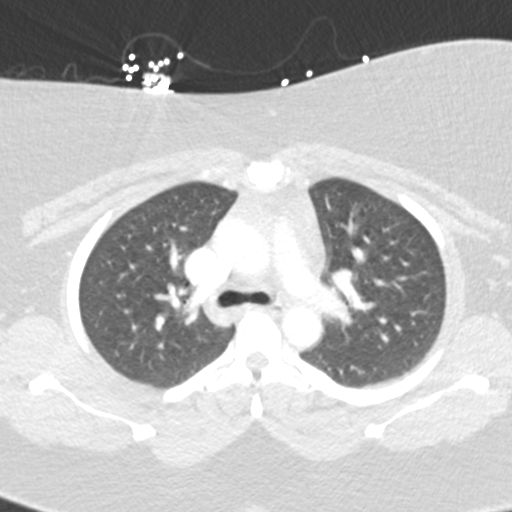
[im 230/296  mediastinal]
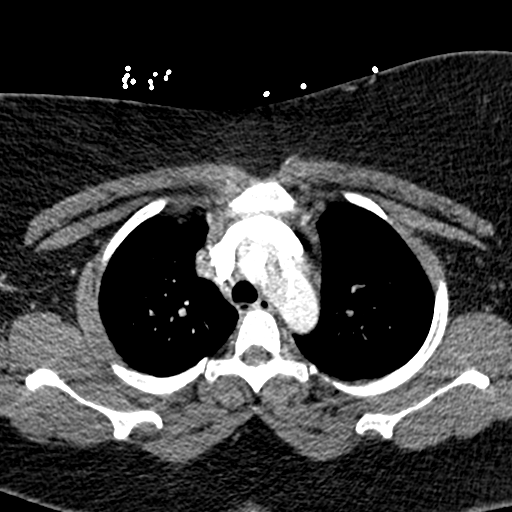
[im 246/296  lung]
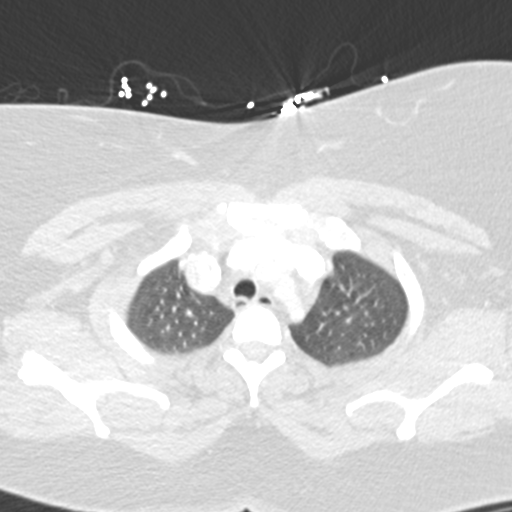
[im 263/296  mediastinal]
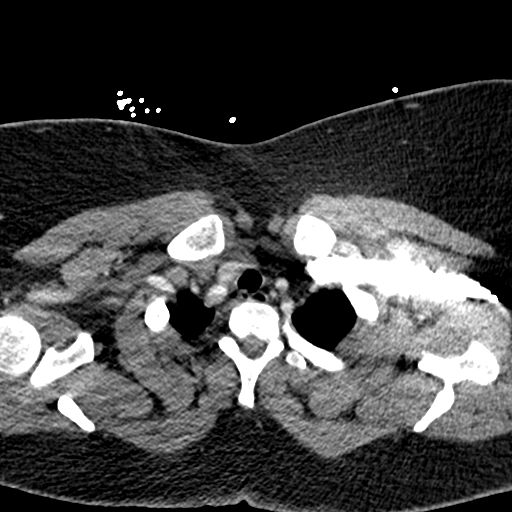
[im 279/296  lung]
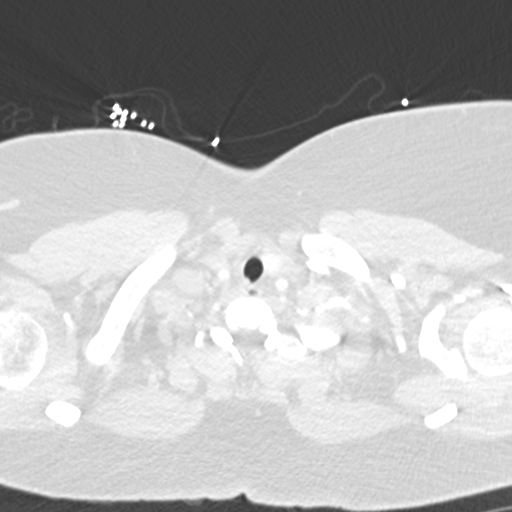

[Series 8: coronal mpr · coronal · 0.61mm/px · 1 of 151 slices shown]
[im 76/151  mediastinal]
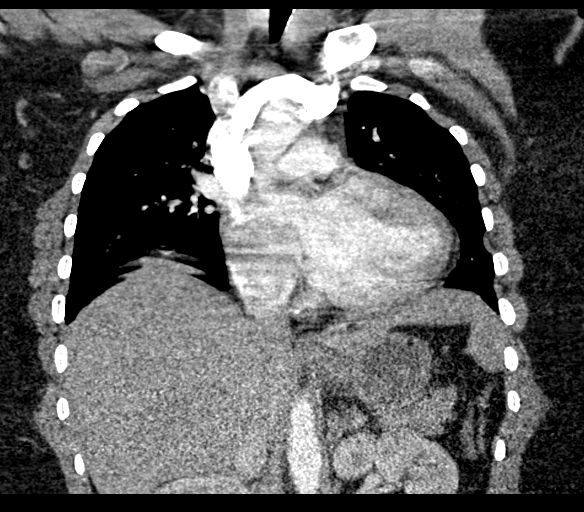

[18 of 36 positions shown; findings below may reference images not displayed]

FINDINGS: Cardiovascular: This is a technically adequate evaluation of the
pulmonary vasculature. There are bilateral segmental pulmonary
emboli, most prominent in the left lower lobe distribution. No
evidence of right heart strain.

There is mild cardiomegaly. No pericardial effusion. Thoracic aorta
is unremarkable.

Mediastinum/Nodes: No enlarged mediastinal, hilar, or axillary lymph
nodes. Thyroid gland, trachea, and esophagus demonstrate no
significant findings.

Lungs/Pleura: No airspace disease, effusion, or pneumothorax.
Central airways are patent.

Upper Abdomen: No acute abnormality.

Musculoskeletal: No acute or destructive bony lesions. Reconstructed
images demonstrate no additional findings.

Review of the MIP images confirms the above findings.
IMPRESSION: 1. Bilateral segmental pulmonary emboli. No evidence of right heart
strain.
2. Mild cardiomegaly.

Critical Value/emergent results were called by telephone at the time
of interpretation on 04/07/2020 at [DATE] to provider TIMAMMY
MARJAN , who verbally acknowledged these results.

## 2021-05-27 HISTORY — PX: WISDOM TOOTH EXTRACTION: SHX21

## 2021-06-08 ENCOUNTER — Encounter: Payer: Self-pay | Admitting: Internal Medicine

## 2021-06-13 ENCOUNTER — Encounter: Payer: Self-pay | Admitting: Internal Medicine

## 2021-10-10 ENCOUNTER — Telehealth (INDEPENDENT_AMBULATORY_CARE_PROVIDER_SITE_OTHER): Payer: 59 | Admitting: Medical

## 2021-10-10 VITALS — Wt 387.0 lb

## 2021-10-10 DIAGNOSIS — J019 Acute sinusitis, unspecified: Secondary | ICD-10-CM | POA: Diagnosis not present

## 2021-10-10 DIAGNOSIS — J301 Allergic rhinitis due to pollen: Secondary | ICD-10-CM

## 2021-10-10 DIAGNOSIS — H9202 Otalgia, left ear: Secondary | ICD-10-CM | POA: Diagnosis not present

## 2021-10-10 MED ORDER — CLARITHROMYCIN 500 MG PO TABS: 500.0000 mg | ORAL_TABLET | Freq: Two times a day (BID) | ORAL | 0 refills | Status: DC

## 2021-10-10 NOTE — Progress Notes (Signed)
?Subjective:  ?  ? Patient ID: Rosaly Labarbera, female   DOB: 08/23/1990, 31 y.o.   MRN: [id number] ? ?This visit type was conducted due to national recommendations for restrictions regarding the COVID-19 Pandemic (e.g. social distancing) in an effort to limit this patient's exposure and mitigate transmission in our community.  Due to their co-morbid illnesses, this patient is at least at moderate risk for complications without adequate follow up.  This format is felt to be most appropriate for this patient at this time.   ? ?Documentation for virtual audio and video telecommunications through [employer] encounter: ? ?The patient was located at home. ?The provider was located in the office. ?The patient did consent to this visit and is aware of possible charges through their insurance for this visit. ? ?The other persons participating in this telemedicine service were none. ?Time spent on call was 20 minutes and in review of previous records 20 minutes total. ? ?This virtual service is not related to other E/M service within previous 7 days. ? ? ?HPI ?Chief Complaint  ?Patient presents with  ? Sinus Problem  ?  Sinus problem. Ear pain, swollen lymph node. Thinks her left ear is infected. Back in December had the same thing but didn't finish antibiotic. been going on for about 6 days   ? ?Virtual consult for ear and sinus pressure.  Symptoms for at least a week as far as ear pain and head pressure, worse last night.  She has had allergy problems ongoing though.  She notes sinus pressure, scratchy throat, had some left ear itching but now ear hurts on the left.   Feels a swollen lymph node in front of the ear.  No fever, no nausea, no vomiting.  You started using Tylenol severe sinus last night. ? ?She notes having some normal blood work with her gynecologist in March 2023.  Of note she has history of slight elevated white back in 2021 but has not been back here in over a year for recheck or physical ? ?Past Medical  History:  ?Diagnosis Date  ? Allergy   ? Chronic back pain   ? GERD (gastroesophageal reflux disease)   ? Polycystic ovarian disease   ? ?Current Outpatient Medications on File Prior to Visit  ?Medication Sig Dispense Refill  ? EPINEPHrine 0.3 mg/0.3 mL IJ SOAJ injection Inject 0.3 mLs (0.3 mg total) into the muscle as needed for anaphylaxis. 2 each 1  ? metFORMIN (GLUCOPHAGE) 500 MG tablet Take 500 mg by mouth every evening.     ? tretinoin (RETIN-A) 0.025 % cream SMARTSIG:1 Topical Every Night    ? [DISCONTINUED] diphenhydrAMINE (BENADRYL) 25 mg capsule Take 1 capsule (25 mg total) by mouth every 6 (six) hours as needed for itching. (Patient not taking: Reported on 04/07/2020) 20 capsule 0  ? ?No current facility-administered medications on file prior to visit.  ? ? ?Review of Systems ?As in subjective ?   ?Objective:  ? Physical Exam ?Due to coronavirus pandemic stay at home measures, patient visit was virtual and they were not examined in person.   ?Wt (!) 387 lb (175.5 kg)   BMI 65.40 kg/m?  ? ?Gen: wd, wn ,nad ?She seems to have a slightly swollen lymph node in of her left postauricular area.  Ear pinna look normal.  Otherwise well-appearing ? ? ?   ?Assessment:  ?   ?Encounter Diagnoses  ?Name Primary?  ? Acute sinusitis, recurrence not specified, unspecified location Yes  ? Allergic rhinitis due  to pollen, unspecified seasonality   ? Left ear pain   ? ? ?   ?Plan:  ?   ?Discussed limitations of virtual consult.  Begin Biaxin.  Can use ibuprofen next few days for pain and inflammation.  Advise rest, good hydration.  She can continue the Tylenol sinus but I did remind her her allergies show history of hives with Sudafed.  To be on the watch for any type of change or allergy since this Tylenol Sinus contains phenylephrine ? ?Advised nasal saline flush.  Discussed trying a different allergy pill since Claritin is not helping as much.  She will consider Allegra plus Flonase nasal spray over-the-counter ? ?If not  much improved over the next few days then call back or recheck.  Follow-up soon for fasting physical otherwise ? ?Stefany was seen today for sinus problem. ? ?Diagnoses and all orders for this visit: ? ?Acute sinusitis, recurrence not specified, unspecified location ? ?Allergic rhinitis due to pollen, unspecified seasonality ? ?Left ear pain ? ?Other orders ?-     clarithromycin (BIAXIN) 500 MG tablet; Take 1 tablet (500 mg total) by mouth 2 (two) times daily. ? ?Follow-up prn ?

## 2021-10-11 ENCOUNTER — Telehealth: Payer: 59 | Admitting: Medical

## 2021-11-10 ENCOUNTER — Emergency Department (HOSPITAL_COMMUNITY)
Admission: EM | Admit: 2021-11-10 | Discharge: 2021-11-10 | Disposition: A | Discharge status: Home / Self Care | Payer: 59 | Attending: Emergency Medicine | Admitting: Emergency Medicine

## 2021-11-10 ENCOUNTER — Encounter (HOSPITAL_COMMUNITY): Payer: Self-pay | Admitting: Emergency Medicine

## 2021-11-10 ENCOUNTER — Other Ambulatory Visit: Payer: Self-pay

## 2021-11-10 DIAGNOSIS — T7840XA Allergy, unspecified, initial encounter: Secondary | ICD-10-CM | POA: Diagnosis present

## 2021-11-10 DIAGNOSIS — T783XXA Angioneurotic edema, initial encounter: Secondary | ICD-10-CM | POA: Insufficient documentation

## 2021-11-10 DIAGNOSIS — Z87898 Personal history of other specified conditions: Secondary | ICD-10-CM

## 2021-11-10 MED ORDER — FAMOTIDINE 20 MG PO TABS
20.0000 mg | ORAL_TABLET | Freq: Once | ORAL | Status: AC
  Administered 2021-11-10: 20 mg via ORAL
  Filled 2021-11-10: qty 1

## 2021-11-10 MED ORDER — EPINEPHRINE 0.3 MG/0.3ML IJ SOAJ: 0.3000 mg | INTRAMUSCULAR | 1 refills | Status: AC | PRN

## 2021-11-10 MED ORDER — DEXAMETHASONE 4 MG PO TABS
10.0000 mg | ORAL_TABLET | Freq: Once | ORAL | Status: AC
  Administered 2021-11-10: 10 mg via ORAL
  Filled 2021-11-10: qty 3

## 2021-11-10 NOTE — ED Provider Notes (Signed)
[facility] EMERGENCY DEPARTMENT Provider Note   CSN: [id number] Arrival date & time: 11/10/21  0132     History  Chief Complaint  Patient presents with   Allergic Reaction    Tasha Hughes is a 31 y.o. female.  HPI     This is a 31 year old female with history of multiple allergies, anaphylaxis, angioedema who presents with concern for throat swelling.  Patient reports that she has a history of both angioedema and anaphylaxis.  She normally has an EpiPen.  However she has run out of refills.  Patient states tonight that she ate several bites of dinner.  She subsequently felt like the left side of her tongue was swelling.  She took Benadryl and states that her symptoms have essentially resolved.  However, she is uncomfortable being at home without an EpiPen.  She did not have any shortness of breath at that time.  No nausea or vomiting.    Home Medications Prior to Admission medications   Medication Sig Start Date End Date Taking? Authorizing Provider  EPINEPHrine 0.3 mg/0.3 mL IJ SOAJ injection Inject 0.3 mg into the muscle as needed for anaphylaxis. 11/10/21  Yes Corben Auzenne, Mayer Masker, MD  clarithromycin (BIAXIN) 500 MG tablet Take 1 tablet (500 mg total) by mouth 2 (two) times daily. 10/10/21   Tysinger, Kermit Balo, PA-C  EPINEPHrine 0.3 mg/0.3 mL IJ SOAJ injection Inject 0.3 mLs (0.3 mg total) into the muscle as needed for anaphylaxis. 12/23/18   Derwood Kaplan, MD  metFORMIN (GLUCOPHAGE) 500 MG tablet Take 500 mg by mouth every evening.  03/15/20   [provider]  tretinoin (RETIN-A) 0.025 % cream SMARTSIG:1 Topical Every Night 07/16/20   [provider]  diphenhydrAMINE (BENADRYL) 25 mg capsule Take 1 capsule (25 mg total) by mouth every 6 (six) hours as needed for itching. Patient not taking: Reported on 04/07/2020 12/23/18 04/07/20  Derwood Kaplan, MD      Allergies    Bee venom, Other, Penicillins, Robitussin [guaifenesin], Shrimp [shellfish allergy], and  Sudafed [pseudoephedrine hcl]    Review of Systems   Review of Systems  HENT:         Tongue swelling  All other systems reviewed and are negative.   Physical Exam Updated Vital Signs BP (!) 142/109 (BP Location: Right Wrist)   Pulse 88   Temp 97.7 F (36.5 C) (Oral)   Resp 18   Ht 1.638 m (5' 4.5")   Wt (!) 172.4 kg   LMP 10/28/2021   SpO2 100%   BMI 64.22 kg/m  Physical Exam Vitals and nursing note reviewed.  Constitutional:      Appearance: She is well-developed. She is obese. She is not ill-appearing.     Comments: ABCs intact  HENT:     Head: Normocephalic and atraumatic.     Mouth/Throat:     Comments: No tongue swelling appreciated, midline, no fullness noted under the tongue, posterior oropharynx clear, no stridor Eyes:     Pupils: Pupils are equal, round, and reactive to light.  Cardiovascular:     Rate and Rhythm: Normal rate and regular rhythm.     Heart sounds: Normal heart sounds.  Pulmonary:     Effort: Pulmonary effort is normal. No respiratory distress.     Breath sounds: No wheezing.  Abdominal:     Palpations: Abdomen is soft.  Musculoskeletal:     Cervical back: Neck supple.  Lymphadenopathy:     Cervical: No cervical adenopathy.  Skin:    General:  Skin is warm and dry.     Findings: No rash.  Neurological:     Mental Status: She is alert and oriented to person, place, and time.  Psychiatric:        Mood and Affect: Mood normal.     ED Results / Procedures / Treatments   Labs (all labs ordered are listed, but only abnormal results are displayed) Labs Reviewed - No data to display  EKG None  Radiology No results found.  Procedures Procedures    Medications Ordered in ED Medications  dexamethasone (DECADRON) tablet 10 mg (has no administration in time range)  famotidine (PEPCID) tablet 20 mg (has no administration in time range)    ED Course/ Medical Decision Making/ A&P                           Medical Decision  Making Risk Prescription drug management.   This patient presents to the ED for concern of tongue swelling, since resolved, this involves an extensive number of treatment options, and is a complaint that carries with it a high risk of complications and morbidity.  I considered the following differential and admission for this acute, potentially life threatening condition.  The differential diagnosis includes angioedema, allergic reaction  MDM:    This is a 31 year old female with history of both angioedema and anaphylaxis.  Reports tongue swelling prior to arrival which has since resolved.  No signs or symptoms at this time of angioedema or anaphylaxis.  Given response to Benadryl, suspect histamine mediated reaction.  Patient was given Decadron and Pepcid.  No indication right now for an EpiPen although I do feel it is appropriate for her to have EpiPen at home given her history.  Will discharge with a prescription for an EpiPen.  (Labs, imaging, consults)  Labs: I Ordered, and personally interpreted labs.  The pertinent results include: None  Imaging Studies ordered: I ordered imaging studies including none I independently visualized and interpreted imaging. I agree with the radiologist interpretation  Additional history obtained from chart review.  External records from outside source obtained and reviewed including prior evaluations  Cardiac Monitoring: The patient was maintained on a cardiac monitor.  I personally viewed and interpreted the cardiac monitored which showed an underlying rhythm of: Normal sinus rhythm  Reevaluation: After the interventions noted above, I reevaluated the patient and found that they have :resolved  Social Determinants of Health: Lives independently  Disposition: Discharge  Co morbidities that complicate the patient evaluation  Past Medical History:  Diagnosis Date   Allergy    Chronic back pain    GERD (gastroesophageal reflux disease)     Polycystic ovarian disease      Medicines Meds ordered this encounter  Medications   dexamethasone (DECADRON) tablet 10 mg   famotidine (PEPCID) tablet 20 mg   EPINEPHrine 0.3 mg/0.3 mL IJ SOAJ injection    Sig: Inject 0.3 mg into the muscle as needed for anaphylaxis.    Dispense:  1 each    Refill:  1    I have reviewed the patients home medicines and have made adjustments as needed  Problem List / ED Course: Problem List Items Addressed This Visit   None Visit Diagnoses     History of angioedema    -  Primary                   Final Clinical Impression(s) / ED Diagnoses Final diagnoses:  History of angioedema    Rx / DC Orders ED Discharge Orders          Ordered    EPINEPHrine 0.3 mg/0.3 mL IJ SOAJ injection  As needed        11/10/21 0242              Valarie Farace, Mayer Masker, MD 11/10/21 [phone]

## 2021-11-10 NOTE — Discharge Instructions (Addendum)
You were seen today with concerns for potential allergic reaction.  You will be given a prescription for an EpiPen.  Only use if you have symptoms related to anaphylaxis.  Continue Benadryl as needed at home.

## 2021-11-10 NOTE — ED Triage Notes (Signed)
Pt states she has a hx of angio edema and anaphylaxis and noticed her tongue started to swell so she came here for evaluation. Pt took a Benadryl PTA. Pt states that swelling has gone down since being here and that she normally has an EPI pen that she keeps at home but that her prescription has "ran out" and she cannot get in touch with her MD until Monday and does not feel safe going home without having one in case something happens again. Pt in no apparent distress.

## 2021-11-12 ENCOUNTER — Ambulatory Visit (INDEPENDENT_AMBULATORY_CARE_PROVIDER_SITE_OTHER): Payer: 59 | Admitting: Medical

## 2021-11-12 ENCOUNTER — Encounter: Payer: Self-pay | Admitting: Medical

## 2021-11-12 VITALS — BP 128/80 | HR 88 | Temp 98.3°F | Wt 397.8 lb

## 2021-11-12 DIAGNOSIS — R220 Localized swelling, mass and lump, head: Secondary | ICD-10-CM

## 2021-11-12 DIAGNOSIS — R519 Headache, unspecified: Secondary | ICD-10-CM

## 2021-11-12 MED ORDER — CLINDAMYCIN HCL 300 MG PO CAPS: 300.0000 mg | ORAL_CAPSULE | Freq: Three times a day (TID) | ORAL | 0 refills | Status: DC

## 2021-11-12 NOTE — Progress Notes (Signed)
Subjective:  Tasha Hughes is a 31 y.o. female who presents for Chief Complaint  Patient presents with   other    Possible infected tooth did have swelling on face but that has gone down     Here for facial swelling.   Went to ED 2 days ago for swelling on face without pain.   Was given steroid and other medications, epipen refilled.  However later that day started getting teeth and facial pain in lower left jaw.  Had root canal of left lower molar this past year.   Worried about teeth infection of abscess.  No fever.  Over weekend did feel a mucous plug release and thinks she may have had parotid gland clogged up.  Has upcoming dental visit planned.  Saw dentist back in 06/2021.    Given hx/o pulmonary embolism and angioedema and anaphylaxis, wanted to come in to discuss the above.    With hx/o PE, ended up using blood thinners x 4 months total.  No other aggravating or relieving factors.    No other c/o.  Past Medical History:  Diagnosis Date   Allergy    Chronic back pain    GERD (gastroesophageal reflux disease)    Polycystic ovarian disease     Current Outpatient Medications on File Prior to Visit  Medication Sig Dispense Refill   EPINEPHrine 0.3 mg/0.3 mL IJ SOAJ injection Inject 0.3 mg into the muscle as needed for anaphylaxis. 1 each 1   metFORMIN (GLUCOPHAGE) 500 MG tablet Take 500 mg by mouth every evening.      tretinoin (RETIN-A) 0.025 % cream SMARTSIG:1 Topical Every Night     [DISCONTINUED] diphenhydrAMINE (BENADRYL) 25 mg capsule Take 1 capsule (25 mg total) by mouth every 6 (six) hours as needed for itching. (Patient not taking: Reported on 04/07/2020) 20 capsule 0   No current facility-administered medications on file prior to visit.      The following portions of the patient's history were reviewed and updated as appropriate: allergies, current medications, past family history, past medical history, past social history, past surgical history and problem  list.  ROS Otherwise as in subjective above    Objective: BP 128/80   Pulse 88   Temp 98.3 F (36.8 C)   Wt (!) 397 lb 12.8 oz (180.4 kg)   LMP 10/28/2021   BMI 67.23 kg/m   Wt Readings from Last 3 Encounters:  11/12/21 (!) 397 lb 12.8 oz (180.4 kg)  11/10/21 (!) 380 lb (172.4 kg)  10/10/21 (!) 387 lb (175.5 kg)   General appearance: alert, no distress, well developed, well nourished HEENT: normocephalic, sclerae anicteric, conjunctiva pink and moist, TMs pearly, nares patent, no discharge or erythema, pharynx normal Oral cavity: MMM, right posterior molar with some decay, but no obvious swelling or deformity in left side of mouth and teeth.   Teeth otherwise in good repair Neck: supple, no lymphadenopathy, no thyromegaly, no masses    Assessment: Encounter Diagnoses  Name Primary?   Facial pain Yes   Facial swelling      Plan: I reviewed her hospital emergency department encounter from over the weekend, reviewed treatment and medicines reconciled.  Facial pain and swelling - likely recently parotitis that seems to be improved.  She did experience a pus or other plug that could have been the gland releasing.  Continue lemon drops and begin antibiotic below  Follow up with dentist soon  Hx/o pulmonary embolism - recheck soon for physical and labs.  Completed  anticoagulant medication x 4 months as of 2021  History of angioedema and anaphylaxis - she is considering allergist referral  Tasha Hughes was seen today for other.  Diagnoses and all orders for this visit:  Facial pain  Facial swelling  Other orders -     clindamycin (CLEOCIN) 300 MG capsule; Take 1 capsule (300 mg total) by mouth 3 (three) times daily.  Follow up: in near future for fasting physical and labs including hypercoagulable panel.

## 2022-01-30 ENCOUNTER — Encounter: Payer: Self-pay | Admitting: Internal Medicine

## 2022-02-27 ENCOUNTER — Telehealth: Payer: Self-pay | Admitting: Medical

## 2022-02-27 NOTE — Telephone Encounter (Signed)
Pt called in, she has a physical with you in January 2024 but she is thinking of getting surgery to help get rid of blood clots, she said you were supposed to do blood panels at her physical appointment and she was wondering if she should have the blood panels done before January of next year. She does not have an exact date for her surgery yet.

## 2022-03-05 ENCOUNTER — Encounter: Payer: Self-pay | Admitting: Internal Medicine

## 2022-03-15 ENCOUNTER — Ambulatory Visit (INDEPENDENT_AMBULATORY_CARE_PROVIDER_SITE_OTHER): Payer: 59 | Admitting: Medical

## 2022-03-15 ENCOUNTER — Encounter: Payer: Self-pay | Admitting: Medical

## 2022-03-15 VITALS — BP 118/78 | HR 81 | Temp 97.8°F | Wt >= 6400 oz

## 2022-03-15 DIAGNOSIS — Z2821 Immunization not carried out because of patient refusal: Secondary | ICD-10-CM

## 2022-03-15 DIAGNOSIS — Z6841 Body Mass Index (BMI) 40.0 and over, adult: Secondary | ICD-10-CM | POA: Diagnosis not present

## 2022-03-15 DIAGNOSIS — I517 Cardiomegaly: Secondary | ICD-10-CM | POA: Diagnosis not present

## 2022-03-15 DIAGNOSIS — Z87891 Personal history of nicotine dependence: Secondary | ICD-10-CM | POA: Diagnosis not present

## 2022-03-15 DIAGNOSIS — Z86711 Personal history of pulmonary embolism: Secondary | ICD-10-CM

## 2022-03-15 NOTE — Progress Notes (Signed)
Subjective:  Tasha Hughes is a 31 y.o. female who presents for Chief Complaint  Patient presents with   discuss blood clots    Discuss blood clots, last seen for this in 2021 so is wanting updated labs and imaging. Only took eliquis for 3 months. No tobacco products since feb 2022. This week started with possible weight loss surgery     Here for panel for hypercoagulable lab.  We had discussed doing this when she is off blood thinners.  Hx/o pulmonary embolism 2021, was on initially 4 months.  She has not been on blood thinners in a little over a year.  She is talking with bariatric clinic and trying to get initial consult soon.  She is considering bariatric surgery.  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  No other aggravating or relieving factors.    No other c/o.  Past Medical History:  Diagnosis Date   Allergy    Chronic back pain    Former smoker    GERD (gastroesophageal reflux disease)    Obese    Polycystic ovarian disease    Pulmonary emboli (HCC) 2021   Current Outpatient Medications on File Prior to Visit  Medication Sig Dispense Refill   EPINEPHrine 0.3 mg/0.3 mL IJ SOAJ injection Inject 0.3 mg into the muscle as needed for anaphylaxis. 1 each 1   metFORMIN (GLUCOPHAGE) 500 MG tablet Take 500 mg by mouth every evening.      tretinoin (RETIN-A) 0.025 % cream SMARTSIG:1 Topical Every Night     [DISCONTINUED] diphenhydrAMINE (BENADRYL) 25 mg capsule Take 1 capsule (25 mg total) by mouth every 6 (six) hours as needed for itching. (Patient not taking: Reported on 04/07/2020) 20 capsule 0   No current facility-administered medications on file prior to visit.     The following portions of the patient's history were reviewed and updated as appropriate: allergies, current medications, past family history, past medical history, past social history, past surgical  history and problem list.  ROS Otherwise as in subjective above    Objective: BP 118/78   Pulse 81   Temp 97.8 F (36.6 C)   Wt (!) 400 lb 12.8 oz (181.8 kg)   SpO2 98%   BMI 67.73 kg/m   Wt Readings from Last 3 Encounters:  03/15/22 (!) 400 lb 12.8 oz (181.8 kg)  11/12/21 (!) 397 lb 12.8 oz (180.4 kg)  11/10/21 (!) 380 lb (172.4 kg)   General appearance: alert, no distress, well developed, well nourished Neck: supple, no lymphadenopathy, no thyromegaly, no masses, no JVD or bruits Heart: RRR, normal S1, S2, no murmurs Lungs: CTA bilaterally, no wheezes, rhonchi, or rales Pulses: 2+ radial pulses, 2+ pedal pulses, normal cap refill Ext: no edema, no calf pain or swelling    Assessment: Encounter Diagnoses  Name Primary?   History of pulmonary embolism Yes   BMI 60.0-69.9, adult (HCC)    Cardiomegaly    Former smoker      Plan: Labs today to evaluate for underlying risk for blood clot given history of pulmonary embolism in 2021.  She completed 4 months of therapy and has not had any recurrence of blood clot.  No significant family history of blood clot.  She quit smoking thankfully but is a former smoker.  She is working on getting appointment with bariatric clinic for potential surgery.  Encouraged her to try to get more exercise.  [name] was seen today for discuss blood clots.  Diagnoses and all orders for this visit:  History of pulmonary embolism -     Hypercoagulable panel, comprehensive  BMI 60.0-69.9, adult (HCC)  Cardiomegaly -     Hypercoagulable panel, comprehensive  Former smoker   Follow up: pending labs

## 2022-03-27 LAB — HYPERCOAGULABLE PANEL, COMPREHENSIVE
APTT: 26.7 s
AT III Act/Nor PPP Chro: 107 %
Act. Prt C Resist w/FV Defic.: 2.4 ratio
Anticardiolipin Ab, IgG: <10 [GPL'U]
Anticardiolipin Ab, IgM: <10 [MPL'U]
Beta-2 Glycoprotein I, IgA: <10 SAU
Beta-2 Glycoprotein I, IgG: <10 SGU
Beta-2 Glycoprotein I, IgM: <10 SMU
DRVVT Screen Seconds: 33.7 s
Factor VII Antigen**: 84 %
Factor VIII Activity: 211 % — ABNORMAL HIGH
Hexagonal Phospholipid Neutral: 4 s
Homocysteine: 6.9 umol/L
Prot C Ag Act/Nor PPP Imm: 78 %
Prot S Ag Act/Nor PPP Imm: 86 %
Protein C Ag/FVII Ag Ratio**: 0.9 ratio
Protein S Ag/FVII Ag Ratio**: 1 ratio

## 2022-06-03 ENCOUNTER — Encounter: Payer: 59 | Admitting: Medical

## 2022-06-27 ENCOUNTER — Other Ambulatory Visit (HOSPITAL_BASED_OUTPATIENT_CLINIC_OR_DEPARTMENT_OTHER): Payer: Self-pay | Admitting: Surgery

## 2022-06-27 ENCOUNTER — Other Ambulatory Visit (HOSPITAL_COMMUNITY): Payer: Self-pay | Admitting: Surgery

## 2022-06-27 DIAGNOSIS — I2699 Other pulmonary embolism without acute cor pulmonale: Secondary | ICD-10-CM

## 2022-06-28 ENCOUNTER — Telehealth: Payer: Self-pay | Admitting: Oncology

## 2022-06-28 ENCOUNTER — Telehealth: Payer: Self-pay

## 2022-06-28 NOTE — Telephone Encounter (Signed)
..     Pre-operative Risk Assessment    Patient Name: Tasha Hughes  DOB: [birth year] MRN: [id number]      Request for Surgical Clearance    Procedure:   SLEEVE GASTRECTOMY  Date of Surgery:  Clearance TBD                                 Surgeon:  Elesa Massed Surgeon's Group or Practice Name:  [facility] Phone number:  [phone] Fax number:  [phone]   Type of Clearance Requested:   - Medical    Type of Anesthesia:   UNK   Additional requests/questions:    Gwenlyn Found   06/28/2022, 11:47 AM

## 2022-06-28 NOTE — Telephone Encounter (Signed)
Scheduled appt per 2/1 referral. Pt is aware of appt date and time. Pt is aware to arrive 15 mins prior to appt time and to bring and updated insurance card. Pt is aware of appt location.   

## 2022-07-01 ENCOUNTER — Other Ambulatory Visit (HOSPITAL_BASED_OUTPATIENT_CLINIC_OR_DEPARTMENT_OTHER): Payer: Self-pay | Admitting: Surgery

## 2022-07-03 ENCOUNTER — Telehealth (INDEPENDENT_AMBULATORY_CARE_PROVIDER_SITE_OTHER): Payer: 59 | Admitting: Medical

## 2022-07-03 VITALS — BP 126/78 | Temp 100.5°F | Wt >= 6400 oz

## 2022-07-03 DIAGNOSIS — R6889 Other general symptoms and signs: Secondary | ICD-10-CM

## 2022-07-03 DIAGNOSIS — R519 Headache, unspecified: Secondary | ICD-10-CM | POA: Diagnosis not present

## 2022-07-03 MED ORDER — OSELTAMIVIR PHOSPHATE 75 MG PO CAPS: 75.0000 mg | ORAL_CAPSULE | Freq: Two times a day (BID) | ORAL | 0 refills | Status: DC

## 2022-07-03 NOTE — Progress Notes (Signed)
Subjective:     Patient ID: Tasha Hughes, female   DOB: 1990-09-19, 32 y.o.   MRN: [id number]  This visit type was conducted due to national recommendations for restrictions regarding the COVID-19 Pandemic (e.g. social distancing) in an effort to limit this patient's exposure and mitigate transmission in our community.  Due to their co-morbid illnesses, this patient is at least at moderate risk for complications without adequate follow up.  This format is felt to be most appropriate for this patient at this time.    Documentation for virtual audio and video telecommunications through [facility] encounter:  The patient was located at home. The provider was located in the office. The patient did consent to this visit and is aware of possible charges through their insurance for this visit.  The other persons participating in this telemedicine service were none. Time spent on call was 20 minutes and in review of previous records 20 minutes total.  This virtual service is not related to other E/M service within previous 7 days.   HPI Chief Complaint  Patient presents with   sick    Sick- fever 103- 100.5 since Sunday. Sinus pressure, headache, neck and shoulders are stiff. Symptoms started on Sunday. Covid- negative today.    Virtual consult for illness.  Started Sunday 3 days ago with body ache,s fever, sinus pressure.   Has been using some coricidin.   Fever up to 103, 2 days ago.  Fever intermittent.   She notes headache, pressure behind eyes, neck and shoulders achy.   No sore throat, no ear pain.   No cough.  Has mild congestion in nose, but no runny nose.  Feels sinus pressure in between eyes.  No other aggravating or relieving factors. Her work environment does bi weekly covid test and no covid positive and she has had negative covid test.    Works at [employer] works from home.  No other complaint.  Past Medical History:  Diagnosis Date   Allergy    Chronic back pain    Former  smoker    GERD (gastroesophageal reflux disease)    Obese    Polycystic ovarian disease    Pulmonary emboli ([place]) 2021   Current Outpatient Medications on File Prior to Visit  Medication Sig Dispense Refill   EPINEPHrine 0.3 mg/0.3 mL IJ SOAJ injection Inject 0.3 mg into the muscle as needed for anaphylaxis. 1 each 1   metFORMIN (GLUCOPHAGE) 500 MG tablet Take 500 mg by mouth every evening.      tretinoin (RETIN-A) 0.025 % cream SMARTSIG:1 Topical Every Night     [DISCONTINUED] diphenhydrAMINE (BENADRYL) 25 mg capsule Take 1 capsule (25 mg total) by mouth every 6 (six) hours as needed for itching. (Patient not taking: Reported on 04/07/2020) 20 capsule 0   No current facility-administered medications on file prior to visit.    Review of Systems As in subjective    Objective:   Physical Exam Due to coronavirus pandemic stay at home measures, patient visit was virtual and they were not examined in person.   BP 126/78   Temp (!) 100.5 F (38.1 C)   Wt (!) 403 lb (182.8 kg)   BMI 68.11 kg/m  Gen: wd, wn, nad No lethargy, no labored breathing, no wheezing, no obvious kernigs or photophobia     Assessment:     Encounter Diagnoses  Name Primary?   Flu-like symptoms Yes   Nonintractable headache, unspecified chronicity pattern, unspecified headache type  Plan:     We discussed the limitations of virtual consult.  We she tested negative for COVID at home and at work regularly every week.  The assumption is that she probably has influenza.  We discussed other potential evaluation which could include chest x-ray or other evaluation but she is comfortable with the current empiric assumption that this is influenza  If worse symptoms in the next 48 hours, if rash, if more severe, if +photophobia or worsening headache or lethargy, then go to the emergency dept.  Prescription given for Tamiflu, discussed risks/benefits of medication.    Discussed diagnosis of influenza.  Discussed supportive care including rest, hydration, OTC Tylenol or NSAID for fever, aches, and malaise.  Discussed period of contagion, self quarantine at home away from others to avoid spread of disease, discussed means of transmission, and possible complications including pneumonia.  If worse or not improving within the next 4-5 days, then call or return.  Patient voiced understanding of diagnosis, recommendations, and treatment plan.  [name] was seen today for sick.  Diagnoses and all orders for this visit:  Flu-like symptoms  Nonintractable headache, unspecified chronicity pattern, unspecified headache type  Other orders -     oseltamivir (TAMIFLU) 75 MG capsule; Take 1 capsule (75 mg total) by mouth 2 (two) times daily.    F/u prn

## 2022-07-04 ENCOUNTER — Other Ambulatory Visit: Payer: Self-pay

## 2022-07-04 ENCOUNTER — Emergency Department (HOSPITAL_COMMUNITY)
Admission: EM | Admit: 2022-07-04 | Discharge: 2022-07-05 | Disposition: A | Discharge status: Home / Self Care | Payer: 59 | Attending: Emergency Medicine | Admitting: Emergency Medicine

## 2022-07-04 ENCOUNTER — Encounter (HOSPITAL_COMMUNITY): Payer: Self-pay | Admitting: *Deleted

## 2022-07-04 DIAGNOSIS — Z79899 Other long term (current) drug therapy: Secondary | ICD-10-CM | POA: Insufficient documentation

## 2022-07-04 DIAGNOSIS — R5383 Other fatigue: Secondary | ICD-10-CM | POA: Diagnosis not present

## 2022-07-04 DIAGNOSIS — B349 Viral infection, unspecified: Secondary | ICD-10-CM

## 2022-07-04 DIAGNOSIS — R7303 Prediabetes: Secondary | ICD-10-CM | POA: Diagnosis not present

## 2022-07-04 DIAGNOSIS — Z7984 Long term (current) use of oral hypoglycemic drugs: Secondary | ICD-10-CM | POA: Diagnosis not present

## 2022-07-04 DIAGNOSIS — M542 Cervicalgia: Secondary | ICD-10-CM | POA: Insufficient documentation

## 2022-07-04 DIAGNOSIS — Z1152 Encounter for screening for COVID-19: Secondary | ICD-10-CM | POA: Diagnosis not present

## 2022-07-04 DIAGNOSIS — M791 Myalgia, unspecified site: Secondary | ICD-10-CM | POA: Diagnosis not present

## 2022-07-04 DIAGNOSIS — R509 Fever, unspecified: Secondary | ICD-10-CM | POA: Diagnosis not present

## 2022-07-04 LAB — RESP PANEL BY RT-PCR (RSV, FLU A&B, COVID)  RVPGX2
Influenza A by PCR: NEGATIVE
Influenza B by PCR: NEGATIVE
Resp Syncytial Virus by PCR: NEGATIVE
SARS Coronavirus 2 by RT PCR: NEGATIVE

## 2022-07-04 MED ORDER — IBUPROFEN 800 MG PO TABS
800.0000 mg | ORAL_TABLET | Freq: Once | ORAL | Status: AC
  Administered 2022-07-04: 800 mg via ORAL
  Filled 2022-07-04: qty 1

## 2022-07-04 NOTE — ED Provider Notes (Signed)
[facility] Provider Note   CSN: [id number] Arrival date & time: 07/04/22  2205     History  Chief Complaint  Patient presents with   Neck Pain    [facility] is a 32 y.o. female.  Patient is a 32 year old female with past medical history of prediabetes, polycystic ovaries, obesity.  Patient presenting today with complaints of fever.  She reports body aches, fatigue, and temp to 103 that has been occurring over the past 5 days.  She describes some headache and neck stiffness.  She had a telemedicine visit with their primary doctor yesterday and was started on Tamiflu, however symptoms persist.  She has been around a family member with similar symptoms.  She denies urinary complaints, bowel complaints.  She does report some relief with alternation of Tylenol and Motrin.  The history is provided by the patient.       Home Medications Prior to Admission medications   Medication Sig Start Date End Date Taking? Authorizing Provider  EPINEPHrine 0.3 mg/0.3 mL IJ SOAJ injection Inject 0.3 mg into the muscle as needed for anaphylaxis. 11/10/21   Horton, Barbette Hair, MD  metFORMIN (GLUCOPHAGE) 500 MG tablet Take 500 mg by mouth every evening.  03/15/20   [provider]  oseltamivir (TAMIFLU) 75 MG capsule Take 1 capsule (75 mg total) by mouth 2 (two) times daily. 07/03/22   Tysinger, Camelia Eng, PA-C  tretinoin (RETIN-A) 0.025 % cream SMARTSIG:1 Topical Every Night 07/16/20   [provider]  diphenhydrAMINE (BENADRYL) 25 mg capsule Take 1 capsule (25 mg total) by mouth every 6 (six) hours as needed for itching. Patient not taking: Reported on 04/07/2020 12/23/18 04/07/20  Varney Biles, MD      Allergies    Bee venom, Other, Penicillins, Robitussin [guaifenesin], Shrimp [shellfish allergy], and Sudafed [pseudoephedrine hcl]    Review of Systems   Review of Systems  All other systems reviewed and are negative.   Physical  Exam Updated Vital Signs BP 127/87   Pulse 86   Temp (!) 102.6 F (39.2 C) (Oral)   Resp 20   Wt (!) 183 kg   SpO2 99%   BMI 68.18 kg/m  Physical Exam Vitals and nursing note reviewed.  Constitutional:      General: She is not in acute distress.    Appearance: She is well-developed. She is not diaphoretic.  HENT:     Head: Normocephalic and atraumatic.     Right Ear: Tympanic membrane normal.     Left Ear: Tympanic membrane normal.     Nose: Nose normal. No congestion.     Mouth/Throat:     Mouth: Mucous membranes are moist.     Pharynx: No oropharyngeal exudate or posterior oropharyngeal erythema.  Neck:     Comments: Patient is noted to turn her head comfortably while speaking.  There is no nuchal rigidity. Cardiovascular:     Rate and Rhythm: Normal rate and regular rhythm.     Heart sounds: No murmur heard.    No friction rub. No gallop.  Pulmonary:     Effort: Pulmonary effort is normal. No respiratory distress.     Breath sounds: Normal breath sounds. No wheezing.  Abdominal:     General: Bowel sounds are normal. There is no distension.     Palpations: Abdomen is soft.     Tenderness: There is no abdominal tenderness.  Musculoskeletal:        General: Normal range of  motion.     Cervical back: Normal range of motion and neck supple.  Skin:    General: Skin is warm and dry.  Neurological:     General: No focal deficit present.     Mental Status: She is alert and oriented to person, place, and time. Mental status is at baseline.     Cranial Nerves: No cranial nerve deficit.     ED Results / Procedures / Treatments   Labs (all labs ordered are listed, but only abnormal results are displayed) Labs Reviewed  RESP PANEL BY RT-PCR (RSV, FLU A&B, COVID)  RVPGX2    EKG None  Radiology No results found.  Procedures Procedures    Medications Ordered in ED Medications  ibuprofen (ADVIL) tablet 800 mg (800 mg Oral Given 07/04/22 2233)    ED Course/  Medical Decision Making/ A&P  Patient is a 32 year old female presenting with complaints of fever, body aches, and stiff neck.  This has been persistent over the past 5 days.  He has been taking Tylenol and Motrin at home with some relief.  She arrives here with stable vital signs, but is febrile with a temp of 102.3.  She is otherwise well-appearing and physical examination is basically unremarkable.  She describes some discomfort with turning her head, but there is no nuchal rigidity.  Influenza/COVID/RSV test all negative.  After receiving Motrin, temperature is gone from 102.3-99.3 and she feels significantly improved.  At this point, I highly suspect a viral etiology and do not feel as though further workup is indicated.  Patient's primary care doctor mention the possibility of meningitis during a telemedicine visit, however I highly doubt this possibility.  Patient is clinically well-appearing and I appreciate no nuchal rigidity.  I do not feel as though an LP is indicated in this situation.  I will have her continue rotation of Tylenol and ibuprofen.  She is to follow-up and return as needed.  Final Clinical Impression(s) / ED Diagnoses Final diagnoses:  None    Rx / DC Orders ED Discharge Orders     None         Veryl Speak, MD 07/05/22 0010

## 2022-07-04 NOTE — ED Triage Notes (Signed)
Pt is here for neck stiffness and fever.  Covid test was negative.  Pt has tele health visit with PCP and was prescribed tamiflu and she was told to come to ED if symptoms got worse.  Pt took tylenol last at 4pm, has not taken any ibuprofen.  Pt reports ha with this.  Temp 102.6 in triage

## 2022-07-05 ENCOUNTER — Telehealth: Payer: Self-pay | Admitting: Medical

## 2022-07-05 ENCOUNTER — Ambulatory Visit (HOSPITAL_COMMUNITY): Payer: 59

## 2022-07-05 ENCOUNTER — Telehealth: Payer: 59 | Admitting: Medical

## 2022-07-05 NOTE — Discharge Instructions (Signed)
Take Tylenol 1000 mg rotated with ibuprofen 600 mg every 3 hours as needed for fever.  Drink plenty of fluids and get plenty of rest.  Return to the emergency department if symptoms significantly worsen or change.

## 2022-07-05 NOTE — Telephone Encounter (Signed)
Transition Care Management Follow-up Telephone Call Date of discharge and from where: 07/05/2022 [facility] How have you been since you were released from the hospital? Still stick neck feel a little better Any questions or concerns? No  Items Reviewed: Did the pt receive and understand the discharge instructions provided? Yes  Medications obtained and verified? Yes pt was given med in er no rx given Other? No  Any new allergies since your discharge? No  Dietary orders reviewed? Yes Do you have support at home? No   Home Care and Equipment/Supplies: Were home health services ordered? not applicable Follow up appointments reviewed:  PCP Hospital f/u appt confirmed? Yes  Scheduled to see [facility] Early on 07/08/2022 @ 8:15. . Are transportation arrangements needed? No  If their condition worsens, is the pt aware to call PCP or go to the Emergency Dept.? Yes Was the patient provided with contact information for the PCP's office or ED? Yes Was to pt encouraged to call back with questions or concerns? Yes

## 2022-07-07 ENCOUNTER — Emergency Department (HOSPITAL_COMMUNITY)
Admission: EM | Admit: 2022-07-07 | Discharge: 2022-07-08 | Disposition: A | Discharge status: Home / Self Care | Payer: 59 | Attending: Emergency Medicine | Admitting: Emergency Medicine

## 2022-07-07 ENCOUNTER — Other Ambulatory Visit: Payer: Self-pay

## 2022-07-07 ENCOUNTER — Encounter (HOSPITAL_COMMUNITY): Payer: Self-pay

## 2022-07-07 ENCOUNTER — Emergency Department (HOSPITAL_COMMUNITY): Payer: 59

## 2022-07-07 DIAGNOSIS — J069 Acute upper respiratory infection, unspecified: Secondary | ICD-10-CM | POA: Insufficient documentation

## 2022-07-07 DIAGNOSIS — D72829 Elevated white blood cell count, unspecified: Secondary | ICD-10-CM | POA: Diagnosis not present

## 2022-07-07 DIAGNOSIS — D649 Anemia, unspecified: Secondary | ICD-10-CM | POA: Insufficient documentation

## 2022-07-07 DIAGNOSIS — E876 Hypokalemia: Secondary | ICD-10-CM | POA: Diagnosis not present

## 2022-07-07 DIAGNOSIS — J0191 Acute recurrent sinusitis, unspecified: Secondary | ICD-10-CM | POA: Diagnosis not present

## 2022-07-07 DIAGNOSIS — Z7984 Long term (current) use of oral hypoglycemic drugs: Secondary | ICD-10-CM | POA: Diagnosis not present

## 2022-07-07 DIAGNOSIS — R509 Fever, unspecified: Secondary | ICD-10-CM | POA: Diagnosis present

## 2022-07-07 DIAGNOSIS — R000 Tachycardia, unspecified: Secondary | ICD-10-CM | POA: Insufficient documentation

## 2022-07-07 DIAGNOSIS — Z20822 Contact with and (suspected) exposure to covid-19: Secondary | ICD-10-CM | POA: Diagnosis not present

## 2022-07-07 LAB — COMPREHENSIVE METABOLIC PANEL
ALT: 56 U/L — ABNORMAL HIGH (ref 0–44)
AST: 40 U/L (ref 15–41)
Albumin: 3.5 g/dL (ref 3.5–5.0)
Alkaline Phosphatase: 93 U/L (ref 38–126)
Anion gap: 12 (ref 5–15)
BUN: 10 mg/dL (ref 6–20)
CO2: 26 mmol/L (ref 22–32)
Calcium: 8.4 mg/dL — ABNORMAL LOW (ref 8.9–10.3)
Chloride: 98 mmol/L (ref 98–111)
Creatinine, Ser: 0.83 mg/dL (ref 0.44–1.00)
GFR, Estimated: >60 mL/min (ref >60)
Glucose, Bld: 119 mg/dL — ABNORMAL HIGH (ref 70–99)
Potassium: 2.9 mmol/L — ABNORMAL LOW (ref 3.5–5.1)
Sodium: 136 mmol/L (ref 135–145)
Total Bilirubin: 0.3 mg/dL (ref 0.3–1.2)
Total Protein: 8 g/dL (ref 6.5–8.1)

## 2022-07-07 LAB — URINALYSIS, ROUTINE W REFLEX MICROSCOPIC
Bilirubin Urine: NEGATIVE
Glucose, UA: NEGATIVE mg/dL
Hgb urine dipstick: NEGATIVE
Ketones, ur: 5 mg/dL — AB
Leukocytes,Ua: NEGATIVE
Nitrite: NEGATIVE
Protein, ur: 30 mg/dL — AB
Specific Gravity, Urine: 1.03 (ref 1.005–1.030)
pH: 5 (ref 5.0–8.0)

## 2022-07-07 LAB — CBC WITH DIFFERENTIAL/PLATELET
Abs Immature Granulocytes: 0.04 10*3/uL (ref 0.00–0.07)
Basophils Absolute: 0 10*3/uL (ref 0.0–0.1)
Basophils Relative: 0 %
Eosinophils Absolute: 0.3 10*3/uL (ref 0.0–0.5)
Eosinophils Relative: 3 %
HCT: 37.4 % (ref 36.0–46.0)
Hemoglobin: 11.8 g/dL — ABNORMAL LOW (ref 12.0–15.0)
Immature Granulocytes: 0 %
Lymphocytes Relative: 25 %
Lymphs Abs: 2.7 10*3/uL (ref 0.7–4.0)
MCH: 24.7 pg — ABNORMAL LOW (ref 26.0–34.0)
MCHC: 31.6 g/dL (ref 30.0–36.0)
MCV: 78.2 fL — ABNORMAL LOW (ref 80.0–100.0)
Monocytes Absolute: 0.5 10*3/uL (ref 0.1–1.0)
Monocytes Relative: 5 %
Neutro Abs: 7.1 10*3/uL (ref 1.7–7.7)
Neutrophils Relative %: 67 %
Platelets: 353 10*3/uL (ref 150–400)
RBC: 4.78 MIL/uL (ref 3.87–5.11)
RDW: 15.7 % — ABNORMAL HIGH (ref 11.5–15.5)
WBC: 10.7 10*3/uL — ABNORMAL HIGH (ref 4.0–10.5)
nRBC: 0 % (ref 0.0–0.2)

## 2022-07-07 LAB — RESP PANEL BY RT-PCR (RSV, FLU A&B, COVID)  RVPGX2
Influenza A by PCR: NEGATIVE
Influenza B by PCR: NEGATIVE
Resp Syncytial Virus by PCR: NEGATIVE
SARS Coronavirus 2 by RT PCR: NEGATIVE

## 2022-07-07 LAB — LACTIC ACID, PLASMA
Lactic Acid, Venous: 1.3 mmol/L (ref 0.5–1.9)
Lactic Acid, Venous: 1.3 mmol/L (ref 0.5–1.9)

## 2022-07-07 LAB — PREGNANCY, URINE: Preg Test, Ur: NEGATIVE

## 2022-07-07 LAB — GROUP A STREP BY PCR: Group A Strep by PCR: NOT DETECTED

## 2022-07-07 LAB — MAGNESIUM: Magnesium: 1.7 mg/dL (ref 1.7–2.4)

## 2022-07-07 LAB — MONONUCLEOSIS SCREEN: Mono Screen: NEGATIVE

## 2022-07-07 MED ORDER — DOXYCYCLINE HYCLATE 100 MG PO CAPS: 100.0000 mg | ORAL_CAPSULE | Freq: Two times a day (BID) | ORAL | 0 refills | Status: DC

## 2022-07-07 MED ORDER — POTASSIUM CHLORIDE 10 MEQ/100ML IV SOLN
10.0000 meq | Freq: Once | INTRAVENOUS | Status: AC
  Administered 2022-07-07: 10 meq via INTRAVENOUS
  Filled 2022-07-07: qty 100

## 2022-07-07 MED ORDER — POTASSIUM CHLORIDE CRYS ER 20 MEQ PO TBCR
40.0000 meq | EXTENDED_RELEASE_TABLET | Freq: Once | ORAL | Status: AC
  Administered 2022-07-07: 40 meq via ORAL
  Filled 2022-07-07: qty 2

## 2022-07-07 MED ORDER — KETOROLAC TROMETHAMINE 30 MG/ML IJ SOLN
30.0000 mg | Freq: Once | INTRAMUSCULAR | Status: AC
  Administered 2022-07-07: 30 mg via INTRAVENOUS
  Filled 2022-07-07: qty 1

## 2022-07-07 MED ORDER — IOHEXOL 300 MG/ML  SOLN
100.0000 mL | Freq: Once | INTRAMUSCULAR | Status: AC | PRN
  Administered 2022-07-07: 100 mL via INTRAVENOUS

## 2022-07-07 MED ORDER — DOXYCYCLINE HYCLATE 100 MG PO TABS
100.0000 mg | ORAL_TABLET | Freq: Once | ORAL | Status: AC
  Administered 2022-07-07: 100 mg via ORAL
  Filled 2022-07-07: qty 1

## 2022-07-07 MED ORDER — SODIUM CHLORIDE 0.9 % IV BOLUS
1000.0000 mL | Freq: Once | INTRAVENOUS | Status: AC
  Administered 2022-07-07: 1000 mL via INTRAVENOUS

## 2022-07-07 NOTE — ED Triage Notes (Signed)
Pt arrived from home via POV w c/o fever, body aches, neck and head 10/10. States that this is day 7 of fever. Fever 102.8 at home, 99.8 in triage. Pt states that she was seen here on Thursday for the same thing. States that the fever goes away with a ton of medicine. Had a telehealth appt with pcp who rx'd her tamiflu in which she completed today. Has an appt with pcp tomorrow.

## 2022-07-07 NOTE — ED Provider Notes (Signed)
[facility] Provider Note   CSN: [id number] Arrival date & time: 07/07/22  2011     History  Chief Complaint  Patient presents with   Fever    [facility] is a 32 y.o. female.  Pt is a 32 yo female with pmhx significant for PCOs, gerd, obesity and PE (not currently on Eliquis).  Pt said she has had a fever for over a week.  She did a tele visit with her pcp and was put on tamiflu.  She continued to have fever, so she came to the ED on 2/8.  Covid/flu/rsv then neg.  She continues to have fever.  It goes away with tylenol or ibuprofen, but it comes back.  She has a headache and body aches.  She has taken several home covid tests which have all been neg.  Pt feels like she has a sinus infection.       Home Medications Prior to Admission medications   Medication Sig Start Date End Date Taking? Authorizing Provider  EPINEPHrine 0.3 mg/0.3 mL IJ SOAJ injection Inject 0.3 mg into the muscle as needed for anaphylaxis. 11/10/21   Horton, Barbette Hair, MD  metFORMIN (GLUCOPHAGE) 500 MG tablet Take 500 mg by mouth every evening.  03/15/20   [provider]  oseltamivir (TAMIFLU) 75 MG capsule Take 1 capsule (75 mg total) by mouth 2 (two) times daily. 07/03/22   Tysinger, Camelia Eng, PA-C  tretinoin (RETIN-A) 0.025 % cream SMARTSIG:1 Topical Every Night 07/16/20   [provider]  diphenhydrAMINE (BENADRYL) 25 mg capsule Take 1 capsule (25 mg total) by mouth every 6 (six) hours as needed for itching. Patient not taking: Reported on 04/07/2020 12/23/18 04/07/20  Varney Biles, MD      Allergies    Bee venom, Other, Penicillins, Robitussin [guaifenesin], Shrimp [shellfish allergy], Soybean-containing drug products, and Sudafed [pseudoephedrine hcl]    Review of Systems   Review of Systems  Constitutional:  Positive for fever.  Musculoskeletal:  Positive for myalgias.  Neurological:  Positive for headaches.  All other systems  reviewed and are negative.   Physical Exam Updated Vital Signs BP (!) 115/97   Pulse (!) 116   Temp 99.8 F (37.7 C) (Oral)   Resp 16   Ht 5' 4.5" (1.638 m)   Wt (!) 183 kg   LMP 06/23/2022 (Approximate) Comment: irregular PCOS very light  SpO2 99%   BMI 68.18 kg/m  Physical Exam Vitals and nursing note reviewed.  Constitutional:      Appearance: Normal appearance. She is obese.  HENT:     Head: Normocephalic and atraumatic.     Right Ear: External ear normal.     Left Ear: External ear normal.     Nose: Nose normal.     Mouth/Throat:     Mouth: Mucous membranes are dry.  Eyes:     Extraocular Movements: Extraocular movements intact.     Conjunctiva/sclera: Conjunctivae normal.     Pupils: Pupils are equal, round, and reactive to light.  Cardiovascular:     Rate and Rhythm: Regular rhythm. Tachycardia present.     Pulses: Normal pulses.     Heart sounds: Normal heart sounds.  Pulmonary:     Effort: Pulmonary effort is normal.     Breath sounds: Normal breath sounds.  Abdominal:     General: Abdomen is flat. Bowel sounds are normal.     Palpations: Abdomen is soft.  Musculoskeletal:  General: Normal range of motion.     Cervical back: Normal range of motion and neck supple.  Skin:    General: Skin is warm.     Capillary Refill: Capillary refill takes less than 2 seconds.  Neurological:     General: No focal deficit present.     Mental Status: She is alert and oriented to person, place, and time.  Psychiatric:        Mood and Affect: Mood normal.        Behavior: Behavior normal.     ED Results / Procedures / Treatments   Labs (all labs ordered are listed, but only abnormal results are displayed) Labs Reviewed  COMPREHENSIVE METABOLIC PANEL - Abnormal; Notable for the following components:      Result Value   Potassium 2.9 (*)    Glucose, Bld 119 (*)    Calcium 8.4 (*)    ALT 56 (*)    All other components within normal limits  CBC WITH  DIFFERENTIAL/PLATELET - Abnormal; Notable for the following components:   WBC 10.7 (*)    Hemoglobin 11.8 (*)    MCV 78.2 (*)    MCH 24.7 (*)    RDW 15.7 (*)    All other components within normal limits  URINALYSIS, ROUTINE W REFLEX MICROSCOPIC - Abnormal; Notable for the following components:   Ketones, ur 5 (*)    Protein, ur 30 (*)    Bacteria, UA RARE (*)    All other components within normal limits  RESP PANEL BY RT-PCR (RSV, FLU A&B, COVID)  RVPGX2  GROUP A STREP BY PCR  CULTURE, BLOOD (ROUTINE X 2)  CULTURE, BLOOD (ROUTINE X 2)  MONONUCLEOSIS SCREEN  PREGNANCY, URINE  LACTIC ACID, PLASMA  MAGNESIUM  LACTIC ACID, PLASMA    EKG None  Radiology CT CHEST ABDOMEN PELVIS W CONTRAST  Result Date: 07/07/2022 CLINICAL DATA:  Sepsis.  Fever. EXAM: CT CHEST, ABDOMEN, AND PELVIS WITH CONTRAST TECHNIQUE: Multidetector CT imaging of the chest, abdomen and pelvis was performed following the standard protocol during bolus administration of intravenous contrast. RADIATION DOSE REDUCTION: This exam was performed according to the departmental dose-optimization program which includes automated exposure control, adjustment of the mA and/or kV according to patient size and/or use of iterative reconstruction technique. CONTRAST:  1102m OMNIPAQUE IOHEXOL 300 MG/ML  SOLN COMPARISON:  CT of the chest 04/07/2020. CT abdomen and pelvis 03/01/2013 FINDINGS: CT CHEST FINDINGS Cardiovascular: No significant vascular findings. Normal heart size. No pericardial effusion. Mediastinum/Nodes: No enlarged mediastinal, hilar, or axillary lymph nodes. Thyroid gland, trachea, and esophagus demonstrate no significant findings. Lungs/Pleura: Lungs are clear. No pleural effusion or pneumothorax. Musculoskeletal: No chest wall mass or suspicious bone lesions identified. CT ABDOMEN PELVIS FINDINGS Hepatobiliary: The liver is enlarged. Gallbladder and bile ducts are within normal limits. Pancreas: Unremarkable. No pancreatic  ductal dilatation or surrounding inflammatory changes. Spleen: Normal in size without focal abnormality. Adrenals/Urinary Tract: Adrenal glands are unremarkable. Kidneys are normal, without renal calculi, focal lesion, or hydronephrosis. Bladder is unremarkable. Stomach/Bowel: Stomach is within normal limits. Appendix is not seen. No evidence of bowel wall thickening, distention, or inflammatory changes. Vascular/Lymphatic: No significant vascular findings are present. No enlarged abdominal or pelvic lymph nodes. Reproductive: Uterus and bilateral adnexa are unremarkable. Other: No abdominal wall hernia or abnormality. No abdominopelvic ascites. Musculoskeletal: There are degenerative changes at L4-L5. These are new from prior. IMPRESSION: 1. No acute localizing process in the chest, abdomen or pelvis. 2. Hepatomegaly. 3. New degenerative changes  at L4-L5. Electronically Signed   By: Ronney Asters M.D.   On: 07/07/2022 23:01   DG Chest Portable 1 View  Result Date: 07/07/2022 CLINICAL DATA:  Fever EXAM: PORTABLE CHEST 1 VIEW COMPARISON:  Chest x-ray 04/07/2020 FINDINGS: Heart is mildly enlarged, unchanged. Both lungs are clear. The visualized skeletal structures are unremarkable. IMPRESSION: No active disease.  Stable cardiomegaly. Electronically Signed   By: Ronney Asters M.D.   On: 07/07/2022 21:08    Procedures Procedures    Medications Ordered in ED Medications  doxycycline (VIBRA-TABS) tablet 100 mg (has no administration in time range)  sodium chloride 0.9 % bolus 1,000 mL (1,000 mLs Intravenous New Bag/Given 07/07/22 2114)  ketorolac (TORADOL) 30 MG/ML injection 30 mg (30 mg Intravenous Given 07/07/22 2117)  potassium chloride 10 mEq in 100 mL IVPB (10 mEq Intravenous New Bag/Given 07/07/22 2208)  potassium chloride SA (KLOR-CON M) CR tablet 40 mEq (40 mEq Oral Given 07/07/22 2208)  iohexol (OMNIPAQUE) 300 MG/ML solution 100 mL (100 mLs Intravenous Contrast Given 07/07/22 2226)    ED Course/  Medical Decision Making/ A&P                             Medical Decision Making Amount and/or Complexity of Data Reviewed Labs: ordered. Radiology: ordered.  Risk Prescription drug management.   This patient presents to the ED for concern of fever, this involves an extensive number of treatment options, and is a complaint that carries with it a high risk of complications and morbidity.  The differential diagnosis includes sepsis, infection   Co morbidities that complicate the patient evaluation  PCOs, gerd, obesity and PE (not currently on Eliquis)   Additional history obtained:  Additional history obtained from epic chart review   Lab Tests:  I Ordered, and personally interpreted labs.  The pertinent results include:  cbc with wbc 10.7, hgb 11.8; ua neg, preg neg, lactic nl, cmp with k low at 2.9, covid/flu/rsv neg, strep neg; mono neg, mg nl   Imaging Studies ordered:  I ordered imaging studies including cxr  I independently visualized and interpreted imaging which showed CXR:  No active disease.  Stable cardiomegaly.  CT chest/abd/pelvis:  No acute localizing process in the chest, abdomen or pelvis.  2. Hepatomegaly.  3. New degenerative changes at L4-L5.   I agree with the radiologist interpretation   Cardiac Monitoring:  The patient was maintained on a cardiac monitor.  I personally viewed and interpreted the cardiac monitored which showed an underlying rhythm of: st   Medicines ordered and prescription drug management:  I ordered medication including ivfs and iv toradol  for sx  Reevaluation of the patient after these medicines showed that the patient improved I have reviewed the patients home medicines and have made adjustments as needed   Test Considered:  ct   Critical Interventions:  ivfs  Problem List / ED Course:  Fever: possibly from sinuses.  Pt said she has a hx of frequent sinus infections.  As sx have been going on for so long, she  is treated with doxy.  Pt is to return if worse.  F/u with pcp. Hypokalemia:  pt given iv and po k   Reevaluation:  After the interventions noted above, I reevaluated the patient and found that they have :improved   Social Determinants of Health:  Lives at home   Dispostion:  After consideration of the diagnostic results and the patients response  to treatment, I feel that the patent would benefit from discharge with outpatient f/u.          Final Clinical Impression(s) / ED Diagnoses Final diagnoses:  Upper respiratory tract infection, unspecified type  Acute recurrent sinusitis, unspecified location  Hypokalemia    Rx / DC Orders ED Discharge Orders     None         Isla Pence, MD 07/07/22 2314

## 2022-07-08 ENCOUNTER — Telehealth: Payer: Self-pay | Admitting: Family Medicine

## 2022-07-08 ENCOUNTER — Ambulatory Visit: Payer: 59 | Admitting: Medical

## 2022-07-08 NOTE — Telephone Encounter (Signed)
Dr Redmond School had after hours call for pt.  Called pt reached voice mail advised we can make an appt, to call us.

## 2022-07-13 LAB — CULTURE, BLOOD (ROUTINE X 2)
Culture: NO GROWTH
Culture: NO GROWTH
Special Requests: ADEQUATE
Special Requests: ADEQUATE

## 2022-07-19 ENCOUNTER — Ambulatory Visit: Payer: 59 | Admitting: Dietician

## 2022-07-22 ENCOUNTER — Ambulatory Visit: Payer: 59 | Admitting: Dietician

## 2022-07-26 ENCOUNTER — Encounter: Payer: 59 | Attending: Surgery | Admitting: Dietician

## 2022-07-26 ENCOUNTER — Encounter: Payer: Self-pay | Admitting: Dietician

## 2022-07-26 ENCOUNTER — Ambulatory Visit: Payer: 59 | Admitting: Dietician

## 2022-07-26 VITALS — Ht 65.0 in | Wt >= 6400 oz

## 2022-07-26 DIAGNOSIS — Z7984 Long term (current) use of oral hypoglycemic drugs: Secondary | ICD-10-CM | POA: Insufficient documentation

## 2022-07-26 DIAGNOSIS — E282 Polycystic ovarian syndrome: Secondary | ICD-10-CM | POA: Diagnosis not present

## 2022-07-26 DIAGNOSIS — E669 Obesity, unspecified: Secondary | ICD-10-CM

## 2022-07-26 DIAGNOSIS — Z713 Dietary counseling and surveillance: Secondary | ICD-10-CM | POA: Insufficient documentation

## 2022-07-26 DIAGNOSIS — Z6841 Body Mass Index (BMI) 40.0 and over, adult: Secondary | ICD-10-CM | POA: Insufficient documentation

## 2022-07-26 NOTE — Progress Notes (Signed)
Nutrition Assessment for Bariatric Surgery: Pre-Surgery Behavioral and Nutrition Intervention Program   Medical Nutrition Therapy  Appt Start Time: 9:00    End Time: 10:02  Patient was seen on 07/26/2022 for Pre-Operative Nutrition Assessment. Purpose of todays visit  enhance perioperative outcomes along with a healthy weight maintenance   Referral stated Supervised Weight Loss (SWL) visits needed: 3  Planned surgery: Sleeve Gastrectomy Pt expectation of surgery: to be 220 (to lose about 180 lbs.)   NUTRITION ASSESSMENT   Anthropometrics  Start weight at [facility]: 400.0 lbs (date: 07/26/2022)  Height: 65 in BMI: 66.56 kg/m2     Clinical   Pharmacotherapy: History of weight loss medication used: [employer] Rx; supplements  Medical hx: obesity, PCOS, GERD Medications: metformin, Claritin   Labs: hemoglobin 11.8 Notable signs/symptoms: none noted Any previous deficiencies? No  Evaluation of Nutritional Deficiencies: Micronutrient Nutrition Focused Physical Exam: Hair: No issues observed Eyes: No issues observed Mouth: No issues observed Neck: No issues observed Nails: No issues observed Skin: No issues observed  Lifestyle & Dietary Hx  Pt states she has PCOS diagnosed at age 32, and obesity on bother sides of her family. Pt states physical activity Pt states her  Pt states she loves bread, stating she eats white bread. Pt states she likes pita bread. Pt states she works from home. Pt states when she started working from home (with COVID), she states she weighed 301 lbs, stating she has gained weight from inactivity.  Current Physical Activity Recommendations state 150 minutes per week of moderate to vigorous movement including Cardio and 1-2 days of resistance activities as well as flexibility/balance activities:  Pts current physical activity: ADLs, with 0% recommendation reached   Sleep Hygiene: duration and quality: about 6-7 hours a night, stating she may take  Unisom and zquil to help sleep.  Current Patient Perceived Stress Level as stated by pt on a scale of 1-10:  3-6       Stress Management Techniques: listen to music  According to the Dietary Guidelines for Americans Recommendation: equivalent 1.5-2 cups fruits per day, equivalent 2-3 cups vegetables per day and at least half all grains whole  Fruit servings per day (on average): 4, meeting 100% recommendation  Non-starchy vegetable servings per day (on average): 2, meeting 75% recommendation  Whole Grains per day (on average): 0-1  Number of meals missed/skipped per week out of 21: 0  24-Hr Dietary Recall First Meal: banana, V8 Splash Snack: egg or two, sausages (all beef lumberjacks) Second Meal: salad, fruit or raman noodles or chicken (left overs) Snack:  Third Meal: chicken or salmon or steak, squash/zucchini/onions, french fries or corn or chicken sandwich. Snack:  Beverages: V8 Slash, water  Alcoholic beverages per week: 2 drinks twice a month   Estimated Energy Needs Calories: 1500  NUTRITION DIAGNOSIS  Overweight/obesity ([place]-3.3) related to past poor dietary habits and physical inactivity as evidenced by patient w/ planned sleeve surgery following dietary guidelines for continued weight loss.    NUTRITION INTERVENTION  Nutrition counseling (C-1) and education (E-2) to facilitate bariatric surgery goals.  Educated pt on micronutrient deficiencies post-surgery and behavioral/dietary strategies to start in order to mitigate that risk   Behavioral and Dietary Interventions Pre-Op Goals Reviewed with the Patient Nutrition: Healthy Eating Behaviors Switch to non-caloric, non-carbonated and non-caffeinated beverages such as  water, unsweetened tea, Crystal Light and zero calorie beverages (aim for 64 oz. per day) Cut out grazing between meals or at night  Find a protein shake  you like Eat every 3-5 hours      Avoid drinking with meals   Eliminate distractions while eating  (TV, computer, reading, driving, texting) Take 20-30 minutes to eat a meal  Decrease high sugar foods/decrease high fat/fried foods Eliminate alcoholic beverages Increase protein intake (eggs, fish, chicken, yogurt) before surgery Eat non starchy vegetables 2 times a day 7 days a week Eat complex carbohydrates such as whole grains and fruits   Behavioral Modification: Physical Activity Increase my usual daily activity (use stairs, park farther, etc.) Engage in _______________________  activity  _______ minutes ______ times per week  Other:    _________________________________________________________________     Problem Solving I will think about my usual eating patterns and how to tweak them How can my friends and family support me Barriers to starting my changes Learn and understand appetite verses hunger   Healthy Coping Allow for ___________ activities per week to help me manage stress Reframe negative thoughts I will keep a picture of someone or something that is my inspiration & look at it daily   Monitoring  Weigh myself once a week  Measure my progress by monitoring how my clothes fit Keep a food record of what I eat and drink for the next ________ (time period) Take pictures of what I eat and drink for the next ________ (time period) Use an app to count steps/day for the next_______ (time period) Measure my progress such as increased energy and more restful sleep Monitor your acid reflux and bowel habits, are they getting better?   *Goals above in bold.  Handouts Provided Include  Bariatric Surgery handouts (Nutrition Visits, Pre Surgery Behavioral Change Goals, Protein Shakes Brands to Choose From, Vitamins & Mineral Supplementation)  Learning Style & Readiness for Change Teaching method utilized: Visual, Auditory, and hands on  Demonstrated degree of understanding via: Teach Back  Readiness Level: preparation Barriers to learning/adherence to lifestyle change:  nothing identified  RD's Notes for Next Visit Patient progress toward chosen goals     MONITORING & EVALUATION Dietary intake, weekly physical activity, body weight, and preoperative behavioral change goals   Next Steps  Patient is to follow up at [facility] in two weeks for first SWL visit.

## 2022-08-02 ENCOUNTER — Encounter: Payer: 59 | Admitting: Medical

## 2022-08-02 ENCOUNTER — Ambulatory Visit (INDEPENDENT_AMBULATORY_CARE_PROVIDER_SITE_OTHER): Payer: 59 | Admitting: Licensed Clinical Social Worker

## 2022-08-02 DIAGNOSIS — F4320 Adjustment disorder, unspecified: Secondary | ICD-10-CM
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
## 2022-08-06 ENCOUNTER — Telehealth: Payer: Self-pay | Admitting: Hematology and Oncology

## 2022-08-06 NOTE — Telephone Encounter (Signed)
R/s per provider pal, message has been left with pt ?

## 2022-08-08 ENCOUNTER — Encounter: Payer: Self-pay | Admitting: *Deleted

## 2022-08-09 ENCOUNTER — Inpatient Hospital Stay: Payer: 59

## 2022-08-09 ENCOUNTER — Inpatient Hospital Stay: Payer: Self-pay

## 2022-08-09 ENCOUNTER — Inpatient Hospital Stay: Payer: 59 | Admitting: Oncology

## 2022-08-09 ENCOUNTER — Inpatient Hospital Stay: Payer: Self-pay | Admitting: Hematology and Oncology

## 2022-08-10 ENCOUNTER — Inpatient Hospital Stay: Payer: 59 | Attending: Oncology | Admitting: Hematology and Oncology

## 2022-08-10 NOTE — Progress Notes (Deleted)
[place] CONSULT NOTE  Patient Care Team: Tysinger, Camelia Eng, PA-C as PCP - General (Family Medicine) Minus Breeding, MD as PCP - Cardiology (Cardiology)  CHIEF COMPLAINTS/PURPOSE OF CONSULTATION:  ***  ASSESSMENT & PLAN:  No problem-specific Assessment & Plan notes found for this encounter.  No orders of the defined types were placed in this encounter.    HISTORY OF PRESENTING ILLNESS:  Tasha Hughes 32 y.o. female is here because of ***  REVIEW OF SYSTEMS:   Constitutional: Denies fevers, chills or abnormal night sweats Eyes: Denies blurriness of vision, double vision or watery eyes Ears, nose, mouth, throat, and face: Denies mucositis or sore throat Respiratory: Denies cough, dyspnea or wheezes Cardiovascular: Denies palpitation, chest discomfort or lower extremity swelling Gastrointestinal:  Denies nausea, heartburn or change in bowel habits Skin: Denies abnormal skin rashes Lymphatics: Denies new lymphadenopathy or easy bruising Neurological:Denies numbness, tingling or new weaknesses Behavioral/Psych: Mood is stable, no new changes  All other systems were reviewed with the patient and are negative.  MEDICAL HISTORY:  Past Medical History:  Diagnosis Date   Allergy    Chronic back pain    Former smoker    GERD (gastroesophageal reflux disease)    Obese    Polycystic ovarian disease    Pulmonary emboli ([place]) 2021    SURGICAL HISTORY: Past Surgical History:  Procedure Laterality Date   TONSILLECTOMY      SOCIAL HISTORY: Social History   Socioeconomic History   Marital status: Single    Spouse name: Not on file   Number of children: Not on file   Years of education: Not on file   Highest education level: Not on file  Occupational History   Not on file  Tobacco Use   Smoking status: Former    Packs/day: 1.00    Years: 6.00    Additional pack years: 0.00    Total pack years: 6.00    Types: Cigarettes   Smokeless tobacco: Never   Vaping Use   Vaping Use: Never used  Substance and Sexual Activity   Alcohol use: Yes    Comment: 4   Drug use: No   Sexual activity: Not Currently  Other Topics Concern   Not on file  Social History Narrative   Lives with mother.  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   02/2022.   Social Determinants of Health   Financial Resource Strain: Not on file  Food Insecurity: Not on file  Transportation Needs: Not on file  Physical Activity: Not on file  Stress: Not on file  Social Connections: Not on file  Intimate Partner Violence: Not on file    FAMILY HISTORY: Family History  Problem Relation Age of Onset   Heart disease Mother    Hypertension Mother    Heart failure Mother    Diabetes Father    Stroke Neg Hx     ALLERGIES:  is allergic to bee venom, other, penicillins, robitussin [guaifenesin], shrimp [shellfish allergy], soybean-containing drug products, and sudafed [pseudoephedrine hcl].  MEDICATIONS:  Current Outpatient Medications  Medication Sig Dispense Refill   doxycycline (VIBRAMYCIN) 100 MG capsule Take 1 capsule (100 mg total) by mouth 2 (two) times daily. 14 capsule 0   EPINEPHrine 0.3 mg/0.3 mL IJ SOAJ injection Inject 0.3 mg into the muscle as needed for anaphylaxis. 1 each 1   metFORMIN (GLUCOPHAGE) 500 MG tablet Take 500 mg by mouth every evening.      oseltamivir (TAMIFLU) 75 MG  capsule Take 1 capsule (75 mg total) by mouth 2 (two) times daily. 10 capsule 0   tretinoin (RETIN-A) 0.025 % cream SMARTSIG:1 Topical Every Night     No current facility-administered medications for this visit.     PHYSICAL EXAMINATION: ECOG PERFORMANCE STATUS: {CHL ONC ECOG PS:[id number]}  There were no vitals filed for this visit. There were no vitals filed for this visit.  GENERAL:alert, no distress and comfortable SKIN: skin color, texture, turgor are normal, no rashes or significant lesions EYES: normal, conjunctiva are pink and non-injected,  sclera clear OROPHARYNX:no exudate, no erythema and lips, buccal mucosa, and tongue normal  NECK: supple, thyroid normal size, non-tender, without nodularity LYMPH:  no palpable lymphadenopathy in the cervical, axillary or inguinal LUNGS: clear to auscultation and percussion with normal breathing effort HEART: regular rate & rhythm and no murmurs and no lower extremity edema ABDOMEN:abdomen soft, non-tender and normal bowel sounds Musculoskeletal:no cyanosis of digits and no clubbing  PSYCH: alert & oriented x 3 with fluent speech NEURO: no focal motor/sensory deficits  LABORATORY DATA:  I have reviewed the data as listed Lab Results  Component Value Date   WBC 10.7 (H) 07/07/2022   HGB 11.8 (L) 07/07/2022   HCT 37.4 07/07/2022   MCV 78.2 (L) 07/07/2022   PLT 353 07/07/2022     Chemistry      Component Value Date/Time   NA 136 07/07/2022 2100   K 2.9 (L) 07/07/2022 2100   CL 98 07/07/2022 2100   CO2 26 07/07/2022 2100   BUN 10 07/07/2022 2100   CREATININE 0.83 07/07/2022 2100      Component Value Date/Time   CALCIUM 8.4 (L) 07/07/2022 2100   ALKPHOS 93 07/07/2022 2100   AST 40 07/07/2022 2100   ALT 56 (H) 07/07/2022 2100   BILITOT 0.3 07/07/2022 2100       RADIOGRAPHIC STUDIES: I have personally reviewed the radiological images as listed and agreed with the findings in the report. No results found.  All questions were answered. The patient knows to call the clinic with any problems, questions or concerns. I spent *** minutes in the care of this patient including H and P, review of records, counseling and coordination of care.     Benay Pike, MD 08/10/2022 12:13 PM

## 2022-08-16 ENCOUNTER — Inpatient Hospital Stay (HOSPITAL_COMMUNITY): Admission: RE | Admit: 2022-08-16 | Payer: 59 | Source: Ambulatory Visit

## 2022-08-16 ENCOUNTER — Encounter: Payer: 59 | Admitting: Dietician

## 2022-08-16 ENCOUNTER — Encounter: Payer: Self-pay | Admitting: Dietician

## 2022-08-16 VITALS — Ht 65.0 in | Wt >= 6400 oz

## 2022-08-16 DIAGNOSIS — E669 Obesity, unspecified: Secondary | ICD-10-CM

## 2022-08-16 NOTE — Progress Notes (Signed)
Supervised Weight Loss Visit Bariatric Nutrition Education Appt Start Time: 8:21    End Time: 8:45  Planned surgery: Sleeve Gastrectomy Pt expectation of surgery: to be 220 (to lose about 180 lbs.)  1 out of 3 SWL Appointments   NUTRITION ASSESSMENT   Anthropometrics  Start weight at [facility]: 400.0 lbs (date: 07/26/2022)  Height: 65 in Weight today: 404.3 lbs. BMI: 67.28 kg/m2    Clinical   Pharmacotherapy: History of weight loss medication used: [employer] Rx; supplements  Medical hx: obesity, PCOS, GERD Medications: metformin, Claritin   Labs: hemoglobin 11.8 Notable signs/symptoms: none noted Any previous deficiencies? No  Lifestyle & Dietary Hx  Pt states she has been working on practicing not drinking with meals, stating some times she forgets.  Pt states it has been harder than she thought it would be. Pt states she just wants to focus on practicing not drinking with meals, so she does not get too overwhelmed.  Pt states she will be able to find a protein shake she likes. Pt states she has had bread twice this past week, stating she likes white bread and not whole wheat bread. Pt states she is starting to eat more non-starchy vegetables. Pt agreeable to track protein and focus more on meal planning.  Estimated daily fluid intake: 96 oz Supplements: none Current average weekly physical activity: on breaks at work, walk daily, 5-7 minutes  24-Hr Dietary Recall First Meal: banana  Snack: egg or two, sausages (all beef lumberjacks) Second Meal: salad, fruit or raman noodles or chicken (left overs) Snack:  Third Meal: chicken or salmon or steak, squash/zucchini/onions, french fries or corn or chicken sandwich. Snack:  Beverages: water, water with crystal light packets  Estimated Energy Needs Calories: 1500   NUTRITION DIAGNOSIS  Overweight/obesity ([place]-3.3) related to past poor dietary habits and physical inactivity as evidenced by patient w/ planned sleeve  surgery following dietary guidelines for continued weight loss.   NUTRITION INTERVENTION  Nutrition counseling (C-1) and education (E-2) to facilitate bariatric surgery goals.  Why you need complex carbohydrates: Whole grains and other complex carbohydrates are required to have a healthy diet. Whole grains provide fiber which can help with blood glucose levels and help keep you satiated. Fruits and starchy vegetables provide essential vitamins and minerals required for immune function, eyesight support, brain support, bone density, wound healing and many other functions within the body. According to the current evidenced based 2020-2025 Dietary Guidelines for Americans, complex carbohydrates are part of a healthy eating pattern which is associated with a decreased risk for type 2 diabetes, cancers, and cardiovascular disease.  Encouraged pt to continue to eat balanced meals inclusive of non starchy vegetables 2 times a day 7 days a week Encouraged pt to choose lean protein sources: limiting beef, pork, sausage, hotdogs, and lunch meat Encouraged pt to continue to drink a minium 64 fluid ounces with half being plain water to satisfy proper hydration   Purpose of protein: Every cell in your body has protein. Protein is essential for the structure, function and regulation of tissues and organs within the body. Without protein enzymes and antibodies would not exist, and cells would lack storage, transportation, and messenger systems. According to [facility] [place], the body is made up of at least 10000 different proteins. Lack of protein can lead to growth failure in children, loss of muscle mass, decreased immune system function, and overall weakening of various organs in the body.  [email or web address], [email or web address], [email or web address]  Pre-Op Goals Progress & New  Goals Continue: find a protein shake you like Continue: practice not drinking with meals; avoiding sugar sweetened beverages New: track protein New: practice meal prepping  Handouts Provided Include  Bariatric MyPlate with food suggestion page Are You Ready handout Mindful Meals activity sheet  Learning Style & Readiness for Change Teaching method utilized: Visual & Auditory  Demonstrated degree of understanding via: Teach Back  Readiness Level: preparation Barriers to learning/adherence to lifestyle change: nothing identified  RD's Notes for next Visit  Patient progress toward chosen goals.   MONITORING & EVALUATION Dietary intake, weekly physical activity, body weight, and pre-op goals in 1 month.   Next Steps  Patient is to return to [facility] in one month for next SWL visit.

## 2022-08-30 ENCOUNTER — Ambulatory Visit (HOSPITAL_COMMUNITY)
Admission: RE | Admit: 2022-08-30 | Discharge: 2022-08-30 | Disposition: A | Discharge status: Home / Self Care | Payer: 59 | Source: Ambulatory Visit | Attending: Surgery | Admitting: Surgery

## 2022-08-30 ENCOUNTER — Other Ambulatory Visit: Payer: Self-pay

## 2022-09-13 ENCOUNTER — Encounter: Payer: Self-pay | Admitting: Medical

## 2022-09-13 ENCOUNTER — Ambulatory Visit (HOSPITAL_COMMUNITY)
Admission: RE | Admit: 2022-09-13 | Discharge: 2022-09-13 | Disposition: A | Discharge status: Home / Self Care | Payer: 59 | Source: Ambulatory Visit | Attending: Surgery | Admitting: Surgery

## 2022-09-13 ENCOUNTER — Ambulatory Visit (INDEPENDENT_AMBULATORY_CARE_PROVIDER_SITE_OTHER): Payer: 59 | Admitting: Medical

## 2022-09-13 VITALS — BP 112/70 | HR 88 | Ht 65.0 in | Wt >= 6400 oz

## 2022-09-13 DIAGNOSIS — M545 Low back pain, unspecified: Secondary | ICD-10-CM

## 2022-09-13 DIAGNOSIS — Z6841 Body Mass Index (BMI) 40.0 and over, adult: Secondary | ICD-10-CM | POA: Diagnosis not present

## 2022-09-13 DIAGNOSIS — R42 Dizziness and giddiness: Secondary | ICD-10-CM

## 2022-09-13 DIAGNOSIS — Z86711 Personal history of pulmonary embolism: Secondary | ICD-10-CM

## 2022-09-13 DIAGNOSIS — G8929 Other chronic pain: Secondary | ICD-10-CM

## 2022-09-13 DIAGNOSIS — I2699 Other pulmonary embolism without acute cor pulmonale: Secondary | ICD-10-CM

## 2022-09-13 DIAGNOSIS — D72829 Elevated white blood cell count, unspecified: Secondary | ICD-10-CM

## 2022-09-13 DIAGNOSIS — Z0000 Encounter for general adult medical examination without abnormal findings: Secondary | ICD-10-CM | POA: Diagnosis not present

## 2022-09-13 DIAGNOSIS — Z131 Encounter for screening for diabetes mellitus: Secondary | ICD-10-CM

## 2022-09-13 DIAGNOSIS — E282 Polycystic ovarian syndrome: Secondary | ICD-10-CM

## 2022-09-13 DIAGNOSIS — Z13220 Encounter for screening for lipoid disorders: Secondary | ICD-10-CM

## 2022-09-13 MED ORDER — TIZANIDINE HCL 4 MG PO TABS: 4.0000 mg | ORAL_TABLET | Freq: Two times a day (BID) | ORAL | 0 refills | Status: DC | PRN

## 2022-09-13 NOTE — Progress Notes (Signed)
Subjective:   HPI  Tasha Hughes is a 32 y.o. female who presents for Chief Complaint  Patient presents with   Annual Exam    Fasting CPE, Pt would like to discuss the pitch nerve pain. Sees Dr. Clance Boll OBGYN.  Does not want any vaccines done today. Pt wants to discuss feelings of lightheadedness from taking metformin.     Patient Care Team: Correy Weidner, Kermit Balo, PA-C as PCP - General (Family Medicine) Rollene Rotunda, MD as PCP - Cardiology (Cardiology) Dr. Twana First, general surgery Hematology - pending Dr. Linford Arnold, gynecology   Concerns: Here for  well visit.  She notes that she has a whole panel labs that she is doing through [facility] surgery on Monday fasting.  Because she is getting closer to bariatric surgery date she has a see cardiology and hematology soon.  She sees both of them soon.    Her major concern is ongoing back pain.  She has chronic low back pain.  Occasionally gets tingling down the right leg but most the time it stays in the low back.  She is not sure if it is from the weight gain or other reasons.  She denies fevers, no significant regular pain down the legs.  No bowel incontinence or urinary incontinence.  She wants her advice on treatment.  She has been doing Tylenol twice a day currently but that is not helping.  She has used some Voltaren gel topically.  She does not tolerate metformin.  She was prescribed this by gynecology for PCOS but gets lightheaded every time she takes it.  She wonders if her sugars are dropping too low with this.  When she eats the symptoms go away   Reviewed their medical, surgical, family, social, medication, and allergy history and updated chart as appropriate.  Allergies  Allergen Reactions   Bee Venom Hives and Swelling   Other Hives    Cat dander   Penicillins Anaphylaxis and Hives    Has patient had a PCN reaction causing immediate rash, facial/tongue/throat swelling, SOB or lightheadedness with  hypotension: Yes Has patient had a PCN reaction causing severe rash involving mucus membranes or skin necrosis: No Has patient had a PCN reaction that required hospitalization Yes Has patient had a PCN reaction occurring within the last 10 years: No If all of the above answers are "NO", then may proceed with Cephalosporin use.    Robitussin [Guaifenesin] Hives and Itching   Shrimp [Shellfish Allergy] Anaphylaxis, Hives, Itching and Swelling   Soybean-Containing Drug Products Anaphylaxis   Sudafed [Pseudoephedrine Hcl] Hives   Gabapentin     headaches    Past Medical History:  Diagnosis Date   Allergy    Chronic back pain    Former smoker    GERD (gastroesophageal reflux disease)    Obese    Polycystic ovarian disease    Pulmonary emboli 2021    Current Outpatient Medications on File Prior to Visit  Medication Sig Dispense Refill   EPINEPHrine 0.3 mg/0.3 mL IJ SOAJ injection Inject 0.3 mg into the muscle as needed for anaphylaxis. 1 each 1   metFORMIN (GLUCOPHAGE) 500 MG tablet Take 500 mg by mouth every evening.      [DISCONTINUED] diphenhydrAMINE (BENADRYL) 25 mg capsule Take 1 capsule (25 mg total) by mouth every 6 (six) hours as needed for itching. (Patient not taking: Reported on 04/07/2020) 20 capsule 0   No current facility-administered medications on file prior to visit.  Current Outpatient Medications:    EPINEPHrine 0.3 mg/0.3 mL IJ SOAJ injection, Inject 0.3 mg into the muscle as needed for anaphylaxis., Disp: 1 each, Rfl: 1   metFORMIN (GLUCOPHAGE) 500 MG tablet, Take 500 mg by mouth every evening. , Disp: , Rfl:    tiZANidine (ZANAFLEX) 4 MG tablet, Take 1 tablet (4 mg total) by mouth 2 (two) times daily as needed for muscle spasms., Disp: 30 tablet, Rfl: 0  Family History  Problem Relation Age of Onset   Heart disease Mother    Hypertension Mother    Heart failure Mother    Diabetes Father    Stroke Neg Hx     Past Surgical History:  Procedure  Laterality Date   TONSILLECTOMY     Review of Systems  Constitutional:  Negative for chills, fever, malaise/fatigue and weight loss.  HENT:  Negative for congestion, ear pain, hearing loss, sore throat and tinnitus.   Eyes:  Negative for blurred vision, pain and redness.  Respiratory:  Negative for cough, hemoptysis and shortness of breath.   Cardiovascular:  Negative for chest pain, palpitations, orthopnea, claudication and leg swelling.  Gastrointestinal:  Negative for abdominal pain, blood in stool, constipation, diarrhea, nausea and vomiting.  Genitourinary:  Negative for dysuria, flank pain, frequency, hematuria and urgency.  Musculoskeletal:  Positive for back pain. Negative for falls, joint pain and myalgias.  Skin:  Negative for itching and rash.  Neurological:  Positive for tingling. Negative for dizziness, speech change, weakness and headaches.  Endo/Heme/Allergies:  Negative for polydipsia. Does not bruise/bleed easily.  Psychiatric/Behavioral:  Negative for depression and memory loss. The patient is not nervous/anxious and does not have insomnia.          Objective:  BP 112/70   Pulse 88   Ht  (1.651 m)   Wt (!) 402 lb (182.3 kg)   LMP 08/12/2022 (Approximate) Comment: Has PCOS. has on and off spotting. Irregular Periods.  SpO2 97%   BMI 66.90 kg/m   General appearance: alert, no distress, WD/WN, African American female Skin: unremarkable HEENT: normocephalic, conjunctiva/corneas normal, sclerae anicteric, PERRLA, EOMi, nares patent, no discharge or erythema, pharynx normal Oral cavity: MMM, tongue normal, teeth normal Neck: supple, no lymphadenopathy, no thyromegaly, no masses, normal ROM, no bruits Chest: non tender, normal shape and expansion Heart: RRR, normal S1, S2, no murmurs Lungs: CTA bilaterally, no wheezes, rhonchi, or rales Abdomen: +bs, soft, non tender, non distended, no masses, no hepatomegaly, no splenomegaly, no bruits Back: Lumbar spine  tenderness in general, otherwise non tender, somewhat decreased range of motion due to pain, no scoliosis Musculoskeletal: Hips nontender with range of motion, otherwise upper extremities non tender, no obvious deformity, normal ROM throughout, lower extremities non tender, no obvious deformity, normal ROM throughout Extremities: no edema, no cyanosis, no clubbing Pulses: 2+ symmetric, upper and lower extremities, normal cap refill Neurological: alert, oriented x 3, CN2-12 intact, strength normal upper extremities and lower extremities, sensation normal throughout, DTRs 2+ throughout, no cerebellar signs, gait normal Psychiatric: normal affect, behavior normal, pleasant  Breast/gyn/rectal - deferred to gynecology     Assessment and Plan :   Encounter Diagnoses  Name Primary?   Encounter for health maintenance examination in adult Yes   History of pulmonary embolism    BMI 60.0-69.9, adult    Chronic bilateral low back pain without sciatica    Screening for diabetes mellitus    Screening for lipid disorders    Leukocytosis, unspecified type    PCOS (  polycystic ovarian syndrome)    Lightheaded      This visit was a preventative care visit, also known as wellness visit or routine physical.   Topics typically include healthy lifestyle, diet, exercise, preventative care, vaccinations, sick and well care, proper use of emergency dept and after hours care, as well as other concerns.    Separate significant issues discussed: Obesity-pending bariatric surgery coming up in May 2024  History of PE-no concerns currently, doing okay the last few months.  She had sepsis and infection back in February, was seen in the hospital February 2024.   Leukocytosis on some prior labs, hx/o PE -she has a hematology consult coming up soon.  We discussed possible causes.  More than likely inflammation.  Nevertheless follow-up with hematologist as planned  I advised her to get me a copy of the panel of labs  she is having done Monday regarding bariatric surgery center.  She knows you are doing a whole panel including diabetes marker, cholesterol and others.  She did not want to do blood work today since she is having his big panel done on Monday that she will get a copy to [place].  Chronic back pain-I reviewed CT abdomen pelvis she had done in February 2024.  She did have some L4-5 degenerative changes at that time but the rest of the lumbar spine looked fairly okay.  We discussed that her weight gain does play a role in her back pain.  She is not doing a lot in terms of exercise or stretching currently.  We discussed try to get on some type of regiment walking and stretching regularly.  I gave her some muscle laxer she can use as needed.  She is already been doing Tylenol twice a day.  Advise she can add some short-term Aleve once or twice a day as needed.  She did not do well on gabapentin in the past through another doctor.  I advised we stay away from narcotics at the moment.  She will let me know if she wants to pursue any other treatment such as physical therapy or other medications at this time.  PCOS-discontinue metformin due to side effects.  She will follow-up with gynecology  General Recommendations: Continue to return yearly for your annual wellness and preventative care visits.  This gives [place] a chance to discuss healthy lifestyle, exercise, vaccinations, review your chart record, and perform screenings where appropriate.  I recommend you see your eye doctor yearly for routine vision care.  I recommend you see your dentist yearly for routine dental care including hygiene visits twice yearly.   Vaccination recommendations were reviewed Immunization History  Administered Date(s) Administered   PPD Test 10/21/2015   Due for Tdap but she declines today   Screening for cancer: Colon cancer screening: Age 14   Breast cancer screening: You should perform a self breast exam monthly.   We  reviewed recommendations for regular mammograms and breast cancer screening. Mammogram advised age 74   Cervical cancer screening: We reviewed recommendations for pap smear screening. Last pap up to date through gyn    Skin cancer screening: Check your skin regularly for new changes, growing lesions, or other lesions of concern Come in for evaluation if you have skin lesions of concern.  Lung cancer screening: If you have a greater than 20 pack year history of tobacco use, then you may qualify for lung cancer screening with a chest CT scan.   Please call your insurance company to inquire  about coverage for this test.  Pancreatic cancer: no current screening test is available routinely recommended.  (Risk factors: Smoking, overweight or obese, diabetes, chronic pancreatitis, work Nurse, mental health, Solicitor, 30 year old or greater, female greater than female, African-American, family history of pancreatic cancer, hereditary breast, ovarian, melanoma, Lynch, Peutz-jeghers).  We currently don't have screenings for other cancers besides breast, cervical, colon, and lung cancers.  If you have a strong family history of cancer or have other cancer screening concerns, please let me know.    Bone health: Get at least 150 minutes of aerobic exercise weekly Get weight bearing exercise at least once weekly Bone density test:  A bone density test is an imaging test that uses a type of X-ray to measure the amount of calcium and other minerals in your bones. The test may be used to diagnose or screen you for a condition that causes weak or thin bones (osteoporosis), predict your risk for a broken bone (fracture), or determine how well your osteoporosis treatment is working. The bone density test is recommended for females 65 and older, or females or males <65 if certain risk factors such as thyroid disease, long term use of steroids such as for asthma or rheumatological issues, vitamin D  deficiency, estrogen deficiency, family history of osteoporosis, self or family history of fragility fracture in first degree relative.    Heart health: Get at least 150 minutes of aerobic exercise weekly Limit alcohol It is important to maintain a healthy blood pressure and healthy cholesterol numbers  Heart disease screening: Screening for heart disease includes screening for blood pressure, fasting lipids, glucose/diabetes screening, BMI height to weight ratio, reviewed of smoking status, physical activity, and diet.    Goals include blood pressure 120/80 or less, maintaining a healthy lipid/cholesterol profile, preventing diabetes or keeping diabetes numbers under good control, not smoking or using tobacco products, exercising most days per week or at least 150 minutes per week of exercise, and eating healthy variety of fruits and vegetables, healthy oils, and avoiding unhealthy food choices like fried food, fast food, high sugar and high cholesterol foods.    Follow-up with cardiology soon for further evaluation prior to surgery    Vascular disease screening: For high risk individuals including smokers, diabetes, patients with known heart disease or high blood pressure, kidney disease, and others, screening for vascular disease or atherosclerosis of the arteries is available.  Examples may include carotid ultrasound, abdominal aortic ultrasound, ABI blood flow screening in the legs, thoracic aorta screening.    Medical care options: I recommend you continue to seek care here first for routine care.  We try really hard to have available appointments Monday through Friday daytime hours for sick visits, acute visits, and physicals.  Urgent care should be used for after hours and weekends for significant issues that cannot wait till the next day.  The emergency department should be used for significant potentially life-threatening emergencies.  The emergency department is expensive, can  often have long wait times for less significant concerns, so try to utilize primary care, urgent care, or telemedicine when possible to avoid unnecessary trips to the emergency department.  Virtual visits and telemedicine have been introduced since the pandemic started in 2020, and can be convenient ways to receive medical care.  We offer virtual appointments as well to assist you in a variety of options to seek medical care.   Legal Take the time to do a Last Will and Testament, advanced directives including Healthcare Power of [place] and  Living Will documents.  Do not leave your family with burdens that can be handled ahead of time.   Advanced Directives: I recommend you consider completing a Health Care Power of Attorney and Living Will.   These documents respect your wishes and help alleviate burdens on your loved ones if you were to become terminally ill or be in a position to need those documents enforced.    You can complete Advanced Directives yourself, have them notarized, then have copies made for our office, for you and for anybody you feel should have them in safe keeping.  Or, you can have an attorney prepare these documents.   If you haven't updated your Last Will and Testament in a while, it may be worthwhile having an attorney prepare these documents together and save on some costs.       Spiritual and Emotional Health Keeping a healthy spiritual life can help you better manage your physical health. Your spiritual life can help you to cope with any issues that may arise with your physical health.  Balance can keep [place] healthy and help [place] to recover.  If you are struggling with your spiritual health there are questions that you may want to ask yourself:  What makes me feel most complete? When do I feel most connected to the rest of the world? Where do I find the most inner strength? What am I doing when I feel whole?  Helpful tips: Being in nature. Some people feel very  connected and at peace when they are walking outdoors or are outside. Helping others. Some feel the largest sense of wellbeing when they are of service to others. Being of service can take on many forms. It can be doing volunteer work, being kind to strangers, or offering a hand to a friend in need. Gratitude. Some people find they feel the most connected when they remain grateful. They may make lists of all the things they are grateful for or say a thank you out loud for all they have.    Emotional Health Are you in tune with your emotional health?  Check out this link: http://www.marquez-love.com/   Financial Health Make sure you use a budget for your personal finances Make sure you are insured against risks (health insurance, life insurance, auto insurance, etc) Save more, spend less Set financial goals If you need help in this area, good resources include counseling through [employer] or other community resources, have a meeting with a Social research officer, government, and a good resource is Psychiatric nurse was seen today for annual exam.  Diagnoses and all orders for this visit:  Encounter for health maintenance examination in adult  History of pulmonary embolism  BMI 60.0-69.9, adult  Chronic bilateral low back pain without sciatica  Screening for diabetes mellitus  Screening for lipid disorders  Leukocytosis, unspecified type  PCOS (polycystic ovarian syndrome)  Lightheaded  Other orders -     tiZANidine (ZANAFLEX) 4 MG tablet; Take 1 tablet (4 mg total) by mouth 2 (two) times daily as needed for muscle spasms.     Follow-up pending labs, yearly for physical

## 2022-09-13 NOTE — Patient Instructions (Signed)
This visit was a preventative care visit, also known as wellness visit or routine physical.   Topics typically include healthy lifestyle, diet, exercise, preventative care, vaccinations, sick and well care, proper use of emergency dept and after hours care, as well as other concerns.    Separate significant issues discussed: Obesity-pending bariatric surgery coming up in May 2024  History of PE-no concerns currently, doing okay the last few months.  She had sepsis and infection back in February, was seen in the hospital February 2024.   Leukocytosis on some prior labs-she has a hematology consult coming up soon.  We discussed possible causes.  More than likely inflammation.  Nevertheless follow-up with hematologist as planned  I advised her to get me a copy of the panel of labs she is having done Monday regarding bariatric surgery center.  She knows you are doing a whole panel including diabetes marker, cholesterol and others.  She did not want to do blood work today since she is having his big panel done on Monday that she will get a copy to [place].  Chronic back pain-I reviewed CT abdomen pelvis she had done in February 2024.  She did have some L4-5 degenerative changes at that time but the rest of the lumbar spine looked fairly okay.  We discussed that her weight gain does play a role in her back pain.  She is not doing a lot in terms of exercise or stretching currently.  We discussed try to get on some type of regiment walking and stretching regularly.  I gave her some muscle laxer she can use as needed.  She is already been doing Tylenol twice a day.  Advise she can add some short-term Aleve once or twice a day as needed.  She did not do well on gabapentin in the past through another doctor.  I advised we stay away from narcotics at the moment.  She will let me know if she wants to pursue any other treatment such as physical therapy or other medications at this time.  PCOS-discontinue metformin due  to side effects.  She will follow-up with gynecology  General Recommendations: Continue to return yearly for your annual wellness and preventative care visits.  This gives [place] a chance to discuss healthy lifestyle, exercise, vaccinations, review your chart record, and perform screenings where appropriate.  I recommend you see your eye doctor yearly for routine vision care.  I recommend you see your dentist yearly for routine dental care including hygiene visits twice yearly.   Vaccination recommendations were reviewed Immunization History  Administered Date(s) Administered   PPD Test 10/21/2015   Due for Tdap but she declines today   Screening for cancer: Colon cancer screening: Age 23   Breast cancer screening: You should perform a self breast exam monthly.   We reviewed recommendations for regular mammograms and breast cancer screening. Mammogram advised age 20   Cervical cancer screening: We reviewed recommendations for pap smear screening. Last pap up to date through gyn    Skin cancer screening: Check your skin regularly for new changes, growing lesions, or other lesions of concern Come in for evaluation if you have skin lesions of concern.  Lung cancer screening: If you have a greater than 20 pack year history of tobacco use, then you may qualify for lung cancer screening with a chest CT scan.   Please call your insurance company to inquire about coverage for this test.  Pancreatic cancer: no current screening test is available routinely recommended.  (  Risk factors: Smoking, overweight or obese, diabetes, chronic pancreatitis, work Nurse, mental health, Solicitor, 69 year old or greater, female greater than female, African-American, family history of pancreatic cancer, hereditary breast, ovarian, melanoma, Lynch, Peutz-jeghers).  We currently don't have screenings for other cancers besides breast, cervical, colon, and lung cancers.  If you have a strong family  history of cancer or have other cancer screening concerns, please let me know.    Bone health: Get at least 150 minutes of aerobic exercise weekly Get weight bearing exercise at least once weekly Bone density test:  A bone density test is an imaging test that uses a type of X-ray to measure the amount of calcium and other minerals in your bones. The test may be used to diagnose or screen you for a condition that causes weak or thin bones (osteoporosis), predict your risk for a broken bone (fracture), or determine how well your osteoporosis treatment is working. The bone density test is recommended for females 65 and older, or females or males <65 if certain risk factors such as thyroid disease, long term use of steroids such as for asthma or rheumatological issues, vitamin D deficiency, estrogen deficiency, family history of osteoporosis, self or family history of fragility fracture in first degree relative.    Heart health: Get at least 150 minutes of aerobic exercise weekly Limit alcohol It is important to maintain a healthy blood pressure and healthy cholesterol numbers  Heart disease screening: Screening for heart disease includes screening for blood pressure, fasting lipids, glucose/diabetes screening, BMI height to weight ratio, reviewed of smoking status, physical activity, and diet.    Goals include blood pressure 120/80 or less, maintaining a healthy lipid/cholesterol profile, preventing diabetes or keeping diabetes numbers under good control, not smoking or using tobacco products, exercising most days per week or at least 150 minutes per week of exercise, and eating healthy variety of fruits and vegetables, healthy oils, and avoiding unhealthy food choices like fried food, fast food, high sugar and high cholesterol foods.    Follow-up with cardiology soon for further evaluation prior to surgery    Vascular disease screening: For high risk individuals including smokers, diabetes,  patients with known heart disease or high blood pressure, kidney disease, and others, screening for vascular disease or atherosclerosis of the arteries is available.  Examples may include carotid ultrasound, abdominal aortic ultrasound, ABI blood flow screening in the legs, thoracic aorta screening.    Medical care options: I recommend you continue to seek care here first for routine care.  We try really hard to have available appointments Monday through Friday daytime hours for sick visits, acute visits, and physicals.  Urgent care should be used for after hours and weekends for significant issues that cannot wait till the next day.  The emergency department should be used for significant potentially life-threatening emergencies.  The emergency department is expensive, can often have long wait times for less significant concerns, so try to utilize primary care, urgent care, or telemedicine when possible to avoid unnecessary trips to the emergency department.  Virtual visits and telemedicine have been introduced since the pandemic started in 2020, and can be convenient ways to receive medical care.  We offer virtual appointments as well to assist you in a variety of options to seek medical care.   Legal Take the time to do a Last Will and Testament, advanced directives including Healthcare Power of Attorney and Living Will documents.  Do not leave your family with burdens that can be handled ahead of  time.   Advanced Directives: I recommend you consider completing a Health Care Power of Attorney and Living Will.   These documents respect your wishes and help alleviate burdens on your loved ones if you were to become terminally ill or be in a position to need those documents enforced.    You can complete Advanced Directives yourself, have them notarized, then have copies made for our office, for you and for anybody you feel should have them in safe keeping.  Or, you can have an attorney prepare these  documents.   If you haven't updated your Last Will and Testament in a while, it may be worthwhile having an attorney prepare these documents together and save on some costs.       Spiritual and Emotional Health Keeping a healthy spiritual life can help you better manage your physical health. Your spiritual life can help you to cope with any issues that may arise with your physical health.  Balance can keep [place] healthy and help [place] to recover.  If you are struggling with your spiritual health there are questions that you may want to ask yourself:  What makes me feel most complete? When do I feel most connected to the rest of the world? Where do I find the most inner strength? What am I doing when I feel whole?  Helpful tips: Being in nature. Some people feel very connected and at peace when they are walking outdoors or are outside. Helping others. Some feel the largest sense of wellbeing when they are of service to others. Being of service can take on many forms. It can be doing volunteer work, being kind to strangers, or offering a hand to a friend in need. Gratitude. Some people find they feel the most connected when they remain grateful. They may make lists of all the things they are grateful for or say a thank you out loud for all they have.    Emotional Health Are you in tune with your emotional health?  Check out this link: http://www.marquez-love.[email or web address]/   Financial Health Make sure you use a budget for your personal finances Make sure you are insured against risks (health insurance, life insurance, auto insurance, etc) Save more, spend less Set financial goals If you need help in this area, good resources include counseling through [employer] or other community resources, have a meeting with a Social research officer, government, and a good resource is [employer]

## 2022-09-20 ENCOUNTER — Encounter: Payer: Self-pay | Admitting: Hematology

## 2022-09-20 ENCOUNTER — Inpatient Hospital Stay (HOSPITAL_BASED_OUTPATIENT_CLINIC_OR_DEPARTMENT_OTHER): Payer: 59 | Admitting: Hematology

## 2022-09-20 ENCOUNTER — Encounter: Payer: Self-pay | Admitting: Dietician

## 2022-09-20 ENCOUNTER — Inpatient Hospital Stay: Payer: 59 | Attending: Oncology

## 2022-09-20 ENCOUNTER — Encounter: Payer: 59 | Attending: Surgery | Admitting: Dietician

## 2022-09-20 ENCOUNTER — Other Ambulatory Visit: Payer: Self-pay

## 2022-09-20 VITALS — Ht 65.0 in | Wt >= 6400 oz

## 2022-09-20 VITALS — BP 133/89 | HR 88 | Temp 98.3°F | Resp 18 | Ht 65.0 in | Wt >= 6400 oz

## 2022-09-20 DIAGNOSIS — Z87891 Personal history of nicotine dependence: Secondary | ICD-10-CM

## 2022-09-20 DIAGNOSIS — Z8249 Family history of ischemic heart disease and other diseases of the circulatory system: Secondary | ICD-10-CM | POA: Insufficient documentation

## 2022-09-20 DIAGNOSIS — Z86711 Personal history of pulmonary embolism: Secondary | ICD-10-CM

## 2022-09-20 DIAGNOSIS — Z800 Family history of malignant neoplasm of digestive organs: Secondary | ICD-10-CM | POA: Insufficient documentation

## 2022-09-20 DIAGNOSIS — R0789 Other chest pain: Secondary | ICD-10-CM | POA: Diagnosis not present

## 2022-09-20 DIAGNOSIS — E669 Obesity, unspecified: Secondary | ICD-10-CM

## 2022-09-20 DIAGNOSIS — Z91030 Bee allergy status: Secondary | ICD-10-CM

## 2022-09-20 DIAGNOSIS — G8929 Other chronic pain: Secondary | ICD-10-CM | POA: Insufficient documentation

## 2022-09-20 DIAGNOSIS — Z79899 Other long term (current) drug therapy: Secondary | ICD-10-CM | POA: Insufficient documentation

## 2022-09-20 DIAGNOSIS — I2699 Other pulmonary embolism without acute cor pulmonale: Secondary | ICD-10-CM

## 2022-09-20 DIAGNOSIS — Z888 Allergy status to other drugs, medicaments and biological substances status: Secondary | ICD-10-CM | POA: Diagnosis not present

## 2022-09-20 DIAGNOSIS — Z833 Family history of diabetes mellitus: Secondary | ICD-10-CM

## 2022-09-20 DIAGNOSIS — K219 Gastro-esophageal reflux disease without esophagitis: Secondary | ICD-10-CM

## 2022-09-20 DIAGNOSIS — Z880 Allergy status to penicillin: Secondary | ICD-10-CM

## 2022-09-20 DIAGNOSIS — E282 Polycystic ovarian syndrome: Secondary | ICD-10-CM | POA: Insufficient documentation

## 2022-09-20 LAB — ANTITHROMBIN III: AntiThromb III Func: 96 % (ref 75–120)

## 2022-09-20 LAB — D-DIMER, QUANTITATIVE: D-Dimer, Quant: 0.44 ug/mL-FEU (ref 0.00–0.50)

## 2022-09-20 NOTE — Progress Notes (Signed)
Supervised Weight Loss Visit Bariatric Nutrition Education Appt Start Time: 8:53    End Time:   Planned surgery: Sleeve Gastrectomy Pt expectation of surgery: to be 220 (to lose about 180 lbs.)  2 out of 3 SWL Appointments   NUTRITION ASSESSMENT   Anthropometrics  Start weight at [facility]: 400.0 lbs (date: 07/26/2022)  Height: 65 in Weight today: 404.7 lbs. BMI: 67.35 kg/m2    Clinical   Pharmacotherapy: History of weight loss medication used: [employer] [facility]; supplements  Medical hx: obesity, PCOS, GERD Medications: metformin, Claritin, iron supplement    Labs: hemoglobin 11.8; potassium 2.9; glucose 119; calcium 8.4;  Notable signs/symptoms: none noted Any previous deficiencies? No  Lifestyle & Dietary Hx  Pt states she will discontinue metformin after two more weeks, stating her doctor told her to discontinue it. Pt states she slacked off on her physical activity, but is getting back to it. Pt states she is getting about 60 grams of protein each day. Pt states meal planning is helping, stating she has slacked off some, but will focus on that again. Pt states she has never been a fast eater, stating she is doing well eating slow. Pt states she is able to identify feelings of satisfaction when eating, stating she will not get stuffed or full. Pt states she is wanting to increase her physical activity. Pt states she wants to re-engage in improving meal prepping.  Estimated daily fluid intake: 96 oz Supplements: none Current average weekly physical activity: on breaks at work, walk daily, 5-7 minutes  24-Hr Dietary Recall First Meal: banana or grapes or fruit tray from [employer] or [place] sausage link and scrambled eggs Snack: egg or two, sausages (all beef lumberjacks) Second Meal: salad, fruit or raman noodles or chicken (left overs) Snack:  Third Meal: chicken or salmon or steak, squash/zucchini/onions, french fries or corn or chicken sandwich. Snack:  Beverages:  water, water with crystal light packets  Estimated Energy Needs Calories: 1500   NUTRITION DIAGNOSIS  Overweight/obesity (Dodge-3.3) related to past poor dietary habits and physical inactivity as evidenced by patient w/ planned sleeve surgery following dietary guidelines for continued weight loss.   NUTRITION INTERVENTION  Nutrition counseling (C-1) and education (E-2) to facilitate bariatric surgery goals.  Why you need complex carbohydrates: Whole grains and other complex carbohydrates are required to have a healthy diet. Whole grains provide fiber which can help with blood glucose levels and help keep you satiated. Fruits and starchy vegetables provide essential vitamins and minerals required for immune function, eyesight support, brain support, bone density, wound healing and many other functions within the body. According to the current evidenced based 2020-2025 Dietary Guidelines for Americans, complex carbohydrates are part of a healthy eating pattern which is associated with a decreased risk for type 2 diabetes, cancers, and cardiovascular disease.  Encouraged patient to honor their body's internal hunger and fullness cues.  Throughout the day, check in mentally and rate hunger. Stop eating when satisfied not full regardless of how much food is left on the plate.  Get more if still hungry 20-30 minutes later.  The key is to honor satisfaction so throughout the meal, rate fullness factor and stop when comfortably satisfied not physically full. The key is to honor hunger and fullness without any feelings of guilt or shame.  Pay attention to what the internal cues are, rather than any external factors. This will enhance the confidence you have in listening to your own body and following those internal cues  enabling you to increase how often you eat when you are hungry not out of appetite and stop when you are satisfied not full.  Encouraged pt to continue to eat balanced meals inclusive of non  starchy vegetables 2 times a day 7 days a week Encouraged pt to choose lean protein sources: limiting beef, pork, sausage, hotdogs, and lunch meat Encouraged pt to continue to drink a minium 64 fluid ounces with half being plain water to satisfy proper hydration    Pre-Op Goals Progress & New Goals Re-engage/Continue: find a protein shake you like Continue: practice not drinking with meals; avoiding sugar sweetened beverages Continue: track protein Re-engage/Continue: practice meal prepping; getting new containers for meal prepping. New: increase physical activity; walk at all three breaks at work.  Handouts Provided Include  Bariatric MyPlate with food suggestion page Are You Ready handout Mindful Meals activity sheet  Learning Style & Readiness for Change Teaching method utilized: Visual & Auditory  Demonstrated degree of understanding via: Teach Back  Readiness Level: preparation Barriers to learning/adherence to lifestyle change: nothing identified  RD's Notes for next Visit  Patient progress toward chosen goals.   MONITORING & EVALUATION Dietary intake, weekly physical activity, body weight, and pre-op goals in 1 month.   Next Steps  Patient is to return to [facility] in one month for next SWL visit.

## 2022-09-20 NOTE — Progress Notes (Signed)
[facility]   [phone] [phone]   Clinic New Consult Note   Patient Care Team: Tysinger, Kermit Balo, PA-C as PCP - General (Family Medicine) Rollene Rotunda, MD as PCP - Cardiology (Cardiology)  Date of Service:  09/20/2022   CHIEF COMPLAINTS/PURPOSE OF CONSULTATION:  Pulmonary embolism  REFERRING PHYSICIAN: Berna Bue, MD  HISTORY OF PRESENTING ILLNESS:  Tasha Hughes 32 y.o. female is a here because of Pulmonary embolism. The patient was referred by her surgeon Berna Bue, MD. The patient presents to the clinic today by herself.   She had a 1 episode of pulmonary embolism in November 2021.  She presented with severe epigastric/lower chest pain with dyspnea.  CTA showed bilateral segmental PE.  she had COVID-vaccine a few weeks before that, and started oral contraceptives 3 to 4 months before the episode of PE.  She was also a smoker for 6 years, with sedentary lifestyle, and morbid obesity.  She was treated with Eliquis for 3-1/2 months.  No DVT was done at that time.   Patient planned to have gastric sleeve bariatric surgery for her obesity.  She has been seen by surgeon Dr. Fredricka Bonine, and gone through some preop tests.  She was referred by Dr. Fredricka Bonine for evaluation of her risk of thrombosis around her surgery.   She has a PMHx of.... -Gerd Maternal Grandfather -Colon Cancer   Socially... Single No children Former smoker   REVIEW OF SYSTEMS:   Constitutional:(-)  Denies fevers, chills or abnormal night sweats Eyes:(-)  Denies blurriness of vision, double vision or watery eyes Ears, nose, mouth, throat, and face: Denies mucositis or sore throat Respiratory:(-)  Denies cough, dyspnea or wheezes Cardiovascular:(-)  Denies palpitation, chest discomfort or lower extremity swelling Gastrointestinal: (-)  Denies nausea, heartburn or change in bowel habits Skin:(-)  Denies abnormal skin rashes Lymphatics:(-)  Denies new  lymphadenopathy or easy bruising Neurological:(-) Denies numbness, tingling or new weaknesses Behavioral/Psych:(-)  Mood is stable, no new changes  All other systems were reviewed with the patient and are negative.   MEDICAL HISTORY:  Past Medical History:  Diagnosis Date   Allergy    Chronic back pain    Former smoker    GERD (gastroesophageal reflux disease)    Obese    Polycystic ovarian disease    Pulmonary emboli (HCC) 2021    SURGICAL HISTORY: Past Surgical History:  Procedure Laterality Date   TONSILLECTOMY      SOCIAL HISTORY: Social History   Socioeconomic History   Marital status: Single    Spouse name: Not on file   Number of children: Not on file   Years of education: Not on file   Highest education level: Not on file  Occupational History   Not on file  Tobacco Use   Smoking status: Former    Packs/day: 1.00    Years: 6.00    Additional pack years: 0.00    Total pack years: 6.00    Types: Cigarettes    Quit date: 07/21/2020    Years since quitting: 2.1   Smokeless tobacco: Never  Vaping Use   Vaping Use: Never used  Substance and Sexual Activity   Alcohol use: Not Currently    Comment: once every 2 weeks   Drug use: No   Sexual activity: Not Currently  Other Topics Concern   Not on file  Social History Narrative   Lives with mother.  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  08/2022   Social Determinants of Health   Financial Resource Strain: Not on file  Food Insecurity: Not on file  Transportation Needs: Not on file  Physical Activity: Not on file  Stress: Not on file  Social Connections: Not on file  Intimate Partner Violence: Not on file    FAMILY HISTORY: Family History  Problem Relation Age of Onset   Heart disease Mother    Hypertension Mother    Heart failure Mother    Diabetes Father    Cancer Maternal Grandfather 77       colon cancer   Stroke Neg Hx     ALLERGIES:  is allergic to bee venom, other,  penicillins, robitussin [guaifenesin], shrimp [shellfish allergy], soybean-containing drug products, sudafed [pseudoephedrine hcl], and gabapentin.  MEDICATIONS:  Current Outpatient Medications  Medication Sig Dispense Refill   EPINEPHrine 0.3 mg/0.3 mL IJ SOAJ injection Inject 0.3 mg into the muscle as needed for anaphylaxis. 1 each 1   metFORMIN (GLUCOPHAGE) 500 MG tablet Take 500 mg by mouth every evening.      tiZANidine (ZANAFLEX) 4 MG tablet Take 1 tablet (4 mg total) by mouth 2 (two) times daily as needed for muscle spasms. 30 tablet 0   No current facility-administered medications for this visit.    PHYSICAL EXAMINATION: ECOG PERFORMANCE STATUS: 0 - Asymptomatic  Vitals:   09/20/22 1554  BP: 133/89  Pulse: 88  Resp: 18  Temp: 98.3 F (36.8 C)  SpO2: 100%   Filed Weights   09/20/22 1554  Weight: (!) 405 lb 8 oz (183.9 kg)     GENERAL:alert, no distress and comfortable SKIN: skin color normal, no rashes or significant lesions EYES: normal, Conjunctiva are pink and non-injected, sclera clear  NEURO: alert & oriented x 3 with fluent speech  LABORATORY DATA:  I have reviewed the data as listed    Latest Ref Rng & Units 07/07/2022    9:00 PM 04/07/2020    8:56 PM 08/04/2017   10:58 AM  CBC  WBC 4.0 - 10.5 K/uL 10.7  11.3  6.0   Hemoglobin 12.0 - 15.0 g/dL 16.1  09.6  04.5   Hematocrit 36.0 - 46.0 % 37.4  43.0  39.7   Platelets 150 - 400 K/uL 353  281  277        Latest Ref Rng & Units 07/07/2022    9:00 PM 04/07/2020    8:56 PM 08/04/2017   10:58 AM  CMP  Glucose 70 - 99 mg/dL 409  811  92   BUN 6 - 20 mg/dL 10  21  12    Creatinine 0.44 - 1.00 mg/dL 9.14  7.82  9.56   Sodium 135 - 145 mmol/L 136  140  139   Potassium 3.5 - 5.1 mmol/L 2.9  3.7  3.6   Chloride 98 - 111 mmol/L 98  104  104   CO2 22 - 32 mmol/L 26  19  24    Calcium 8.9 - 10.3 mg/dL 8.4  9.3  9.0   Total Protein 6.5 - 8.1 g/dL 8.0   7.0   Total Bilirubin 0.3 - 1.2 mg/dL 0.3   0.4   Alkaline  Phos 38 - 126 U/L 93   58   AST 15 - 41 U/L 40   16   ALT 0 - 44 U/L 56   19      RADIOGRAPHIC STUDIES: I have personally reviewed the radiological images as listed and agreed with the findings in the  report. VAS [place] LOWER EXTREMITY VENOUS (DVT)  Result Date: 09/13/2022  Lower Venous DVT Study Patient Name:  Tasha Hughes  Date of Exam:   09/13/2022 Medical Rec #: [id number]       Accession #:    [id number] Date of Birth: [birth year]      Patient Gender: F Patient Age:   31 years Exam Location:  [facility] Procedure:      VAS [place] LOWER EXTREMITY VENOUS (DVT) Referring Phys: CHELSEA CONNOR --------------------------------------------------------------------------------  Indications: Pre-op workup, history of PE.  Comparison Study: No prior studies. Performing Technologist: Jean Rosenthal RDMS, RVT  Examination Guidelines: A complete evaluation includes B-mode imaging, spectral Doppler, color Doppler, and power Doppler as needed of all accessible portions of each vessel. Bilateral testing is considered an integral part of a complete examination. Limited examinations for reoccurring indications may be performed as noted. The reflux portion of the exam is performed with the patient in reverse Trendelenburg.  +---------+---------------+---------+-----------+----------+--------------+ RIGHT    CompressibilityPhasicitySpontaneityPropertiesThrombus Aging +---------+---------------+---------+-----------+----------+--------------+ CFV      Full           Yes      Yes                                 +---------+---------------+---------+-----------+----------+--------------+ SFJ      Full                                                        +---------+---------------+---------+-----------+----------+--------------+ FV Prox  Full                                                        +---------+---------------+---------+-----------+----------+--------------+ FV Mid   Full                                                         +---------+---------------+---------+-----------+----------+--------------+ FV DistalFull                                                        +---------+---------------+---------+-----------+----------+--------------+ PFV      Full                                                        +---------+---------------+---------+-----------+----------+--------------+ POP      Full           Yes      Yes                                 +---------+---------------+---------+-----------+----------+--------------+ PTV      Full                                                        +---------+---------------+---------+-----------+----------+--------------+  PERO     Full                                                        +---------+---------------+---------+-----------+----------+--------------+   +---------+---------------+---------+-----------+----------+--------------+ LEFT     CompressibilityPhasicitySpontaneityPropertiesThrombus Aging +---------+---------------+---------+-----------+----------+--------------+ CFV      Full           Yes      Yes                                 +---------+---------------+---------+-----------+----------+--------------+ SFJ      Full                                                        +---------+---------------+---------+-----------+----------+--------------+ FV Prox  Full                                                        +---------+---------------+---------+-----------+----------+--------------+ FV Mid   Full                                                        +---------+---------------+---------+-----------+----------+--------------+ FV DistalFull                                                        +---------+---------------+---------+-----------+----------+--------------+ PFV      Full                                                         +---------+---------------+---------+-----------+----------+--------------+ POP      Full           Yes      Yes                                 +---------+---------------+---------+-----------+----------+--------------+ PTV      Full                                                        +---------+---------------+---------+-----------+----------+--------------+ PERO     Full                                                        +---------+---------------+---------+-----------+----------+--------------+  Summary:  - There is no evidence of deep vein thrombosis in the lower extremity.  - No cystic structure found in the popliteal fossa.   *See table(s) above for measurements and observations. Electronically signed by Sherald Hess MD on 09/13/2022 at 5:40:11 PM.    Final    DG Chest 2 View  Result Date: 09/01/2022 CLINICAL DATA:  Preop bariatric surgery EXAM: CHEST - 2 VIEW COMPARISON:  07/07/2022 FINDINGS: The heart size and mediastinal contours are within normal limits. Both lungs are clear. The visualized skeletal structures are unremarkable. IMPRESSION: No active cardiopulmonary disease. Electronically Signed   By: Layla Maw M.D.   On: 09/01/2022 00:00   DG UGI W SINGLE CM (SOL OR THIN BA)  Result Date: 08/30/2022 CLINICAL DATA:  Patient history of morbid obesity. Patient presents for upper GI for further surgical evaluation. EXAM: DG UGI W SINGLE CM TECHNIQUE: Scout radiograph was obtained. Single contrast examination was performed using thin liquid barium. This exam was performed by Anders Grant NP and was supervised and interpreted by Dr. Genevive Bi FLUOROSCOPY: Radiation Exposure Index (as provided by the fluoroscopic device): 76.2 mGy Kerma COMPARISON:  None Available. FINDINGS: Scout Radiograph: Within normal limits Esophagus:  Normal appearance. Esophageal motility:  Within normal limits. Gastroesophageal reflux: Small volume gastroesophageal reflux  observed to the lower third of the esophagus during the water siphon maneuver. Ingested 13mm barium tablet:  Not given Stomach: Normal appearance. No hiatal hernia. Gastric emptying: Normal. Duodenum:  Normal appearance. Other:  None. IMPRESSION: Small volume gastroesophageal reflux.  No hiatal hernia. Normal anatomy of the esophagus, stomach, and duodenum. Electronically Signed   By: Genevive Bi M.D.   On: 08/30/2022 09:42    ASSESSMENT & PLAN:  Tasha Hughes is a 32 y.o.  female with a history of    History of pulmonary embolism, likely provoked  -She had a 1 episode of PE in November 2021.  Probably provoked by COVID-vaccine and oral contraceptives.  She also has other risk factors including morbid obesity and heavy smoking. -She was treated with 3 months of Eliquis, and has been off since then. -Due to her young age, I recommend hypercoagulopathy workup, and a D-dimer, to see if she has inheritable thrombophilia -We discussed preventative strategies to reduce her risk of future thrombosis, including active lifestyle, weight loss, compression stocks etc.  She has stopped smoking completely, not on oral contraceptives anymore (we discussed alternative such as nonestrogen containing IUD), and weight loss. -I discussed prophylactic anticoagulation after surgery, especially for orthopedic surgeries.  She will have bariatric surgery with gastric sleeve, she anticipates to be mobile right after surgery, so her risk of thrombosis is not high.  If her surgery may reduce her mobility, I think it is reasonable to use prophylactic dose anticoagulation, such as Eliquis 2.5mg  bid for 2-4 weeks.     PLAN:  -lab today for hypercoagulopathy workup and a D-dimer -recommend staying active and wearing compression socks when sitting and taking Flights. - will Reach out to Dr. Phylliss Blakes for clearance.  -I will call her with lab results, and see her as needed   Orders Placed This Encounter  Procedures    Antithrombin III   Protein C activity   Protein C, total   Protein S activity   Protein S, total   Lupus anticoagulant panel   Beta-2-glycoprotein i abs, IgG/M/A   Homocysteine, serum   Factor 5 leiden   Prothrombin gene mutation   Cardiolipin antibodies, IgG, IgM, IgA  D-dimer, quantitative    Standing Status:   Future    Number of Occurrences:   1    Standing Expiration Date:   09/20/2023    All questions were answered. The patient knows to call the clinic with any problems, questions or concerns. The total time spent in the appointment was 30 minutes.     Malachy Mood, MD 09/20/2022 4:59 PM  I, Monica Martinez, am acting as scribe for Malachy Mood, MD.   I have reviewed the above documentation for accuracy and completeness, and I agree with the above.

## 2022-09-21 LAB — HOMOCYSTEINE: Homocysteine: 8.8 umol/L (ref 0.0–14.5)

## 2022-09-21 LAB — LUPUS ANTICOAGULANT PANEL
DRVVT: 35.7 s (ref 0.0–47.0)
PTT Lupus Anticoagulant: 31.7 s (ref 0.0–43.5)

## 2022-09-21 LAB — PROTEIN S, TOTAL: Protein S Ag, Total: 91 % (ref 60–150)

## 2022-09-21 LAB — PROTEIN C ACTIVITY: Protein C Activity: 115 % (ref 73–180)

## 2022-09-21 LAB — PROTEIN S ACTIVITY: Protein S Activity: 87 % (ref 63–140)

## 2022-09-22 LAB — BETA-2-GLYCOPROTEIN I ABS, IGG/M/A
Beta-2 Glyco I IgG: <9 GPI IgG units (ref 0–20)
Beta-2-Glycoprotein I IgA: <9 GPI IgA units (ref 0–25)
Beta-2-Glycoprotein I IgM: <9 GPI IgM units (ref 0–32)

## 2022-09-23 LAB — PROTEIN C, TOTAL: Protein C, Total: 97 % (ref 60–150)

## 2022-09-24 LAB — CARDIOLIPIN ANTIBODIES, IGG, IGM, IGA
Anticardiolipin IgA: <9 APL U/mL (ref 0–11)
Anticardiolipin IgG: <9 GPL U/mL (ref 0–14)
Anticardiolipin IgM: 23 MPL U/mL — ABNORMAL HIGH (ref 0–12)

## 2022-10-04 LAB — PROTHROMBIN GENE MUTATION

## 2022-10-09 LAB — FACTOR 5 LEIDEN

## 2022-10-09 NOTE — Progress Notes (Signed)
  Cardiology Office Note:   Date:  10/11/2022  ID:  Tasha Hughes, DOB 28-Jul-1990, MRN [id number]  History of Present Illness:   Tasha Hughes is a 32 y.o. female who was referred by Jac Canavan, PA-C in 2022 for evaluation of SOB and cardiomegaly on CT.    She has a history of PE.   This happened shortly after getting the COVID vaccine.   She was seen in the ED in Nov 2022 for this.  I saw her for preop clearance prior to bariatric surgery.    He did not have bariatric surgery at the time of that clearance and she is back now because it seems like this is imminent and she needs clearance again.  Since I last saw her she had no new cardiovascular complaints.  She does a lot of housecleaning on the weekends.  She does all her housework chores and caring in the groceries is included. The patient denies any new symptoms such as chest discomfort, neck or arm discomfort. There has been no new shortness of breath, PND or orthopnea. There have been no reported palpitations, presyncope or syncope.   ROS: As stated in the HPI and negative for all other systems.  Studies Reviewed:    EKG: Sinus rhythm, rate 89, axis within normal limits, intervals within normal limits, no acute ST-T wave changes, August 30, 2022.   Risk Assessment/Calculations:         Physical Exam:   VS:  BP 133/78   Pulse 93   Ht 5\' 5"  (1.651 m)   Wt (!) 401 lb 6.4 oz (182.1 kg)   LMP 08/12/2022 (Approximate) Comment: Has PCOS. has on and off spotting. Irregular Periods.  SpO2 98%   BMI 66.80 kg/m    Wt Readings from Last 3 Encounters:  10/11/22 (!) 401 lb 6.4 oz (182.1 kg)  10/10/22 (!) 401 lb (181.9 kg)  09/20/22 (!) 405 lb 8 oz (183.9 kg)     GEN: Well nourished, well developed in no acute distress NECK: No JVD; No carotid bruits CARDIAC: RRR, no murmurs, rubs, gallops RESPIRATORY:  Clear to auscultation without rales, wheezing or rhonchi  ABDOMEN: Soft, non-tender, non-distended EXTREMITIES:  No edema; No  deformity   ASSESSMENT AND PLAN:   SOB: The patient has no further shortness of breath.  There was a question of cardiomegaly but she had normal echocardiography a couple of years ago.  I do not see any high risk cardiovascular features or findings.  She has a reasonable functional level.  No further workup.     HISTORY OF PE: This was secondary to birth control pills and sedentary lifestyle.  She completed adequate therapy.  She had a negative hypercoagulable workup.    OBESITY: She is being considered for gastric sleeve.  TOBACCO: She has not had a cigarette in a couple of years.  PREOP: The patient is at acceptable risk for the planned surgery.  She has a high functional level.  She is going for low risk procedure.  There are no high risk findings or symptoms.  Therefore, according to ACC/AHA guidelines the patient is at acceptable risk for planned surgery.  Signed, Rollene Rotunda, MD

## 2022-10-10 ENCOUNTER — Ambulatory Visit: Payer: 59 | Admitting: Dietician

## 2022-10-10 ENCOUNTER — Encounter: Payer: Self-pay | Admitting: Skilled Nursing Facility1

## 2022-10-10 ENCOUNTER — Encounter: Payer: 59 | Attending: Surgery | Admitting: Skilled Nursing Facility1

## 2022-10-10 VITALS — Ht 65.0 in | Wt >= 6400 oz

## 2022-10-10 DIAGNOSIS — E669 Obesity, unspecified: Secondary | ICD-10-CM | POA: Diagnosis present

## 2022-10-10 NOTE — Progress Notes (Signed)
Supervised Weight Loss Visit Bariatric Nutrition Education  Planned surgery: Sleeve Gastrectomy Pt expectation of surgery: to be 220 (to lose about 180 lbs.)  3 out of 3 SW[facility] Appointments   Pt completed visits.   Pt has cleared nutrition requirements.    NUTRITION ASSESSMENT   Anthropometrics  Start weight at [facility]: 400.0 lbs (date: 07/26/2022)  Height: 65 in Weight today: 401 lbs. BMI: 67.35 kg/m2    Clinical   Pharmacotherapy: History of weight loss medication used: [employer] [facility]; supplements  Medical hx: obesity, PCOS, GERD Medications: metformin, Claritin, iron supplement    Labs: hemoglobin 11.8; potassium 2.9; glucose 119; calcium 8.4;  Notable signs/symptoms: none noted Any previous deficiencies? No  Lifestyle & Dietary Hx  Pt states she feels she has done with portion control and being more active; state she sees a difference with stopping at satisfaction paying closer attention to her body cues. Pt state she has been consistent with her vitamin intake.   Pt states her sister is her best friend and she lives with her mom.   Pt states after surgery she recognizes she will have to focus in on meal prep stating her goal is to make all lunch and dinner for the whole week on Sundays. Pt states she works from home.   Estimated daily fluid intake: 96 oz Supplements: none Current average weekly physical activity: on breaks at work, walk daily, 5-7 minutes  24-Hr Dietary Recall First Meal 8am: plain oatmeal and berries + honey Snack 11-11:30: fruit or cucumber and vinegar  Second Meal: roast beef sandwich  Snack:  Third Meal: chicken or salmon or steak, squash/zucchini/onions, french fries or corn or chicken sandwich Snack:  Beverages: water, water with crystal light packets  Estimated Energy Needs Calories: 1500   NUTRITION DIAGNOSIS  Overweight/obesity ([place]-3.3) related to past poor dietary habits and physical inactivity as evidenced by patient w/  planned sleeve surgery following dietary guidelines for continued weight loss.   NUTRITION INTERVENTION  Nutrition counseling (C-1) and education (E-2) to facilitate bariatric surgery goals.  Why you need complex carbohydrates: Whole grains and other complex carbohydrates are required to have a healthy diet. Whole grains provide fiber which can help with blood glucose levels and help keep you satiated. Fruits and starchy vegetables provide essential vitamins and minerals required for immune function, eyesight support, brain support, bone density, wound healing and many other functions within the body. According to the current evidenced based 2020-2025 Dietary Guidelines for Americans, complex carbohydrates are part of a healthy eating pattern which is associated with a decreased risk for type 2 diabetes, cancers, and cardiovascular disease.  Encouraged patient to honor their body's internal hunger and fullness cues.  Throughout the day, check in mentally and rate hunger. Stop eating when satisfied not full regardless of how much food is left on the plate.  Get more if still hungry 20-30 minutes later.  The key is to honor satisfaction so throughout the meal, rate fullness factor and stop when comfortably satisfied not physically full. The key is to honor hunger and fullness without any feelings of guilt or shame.  Pay attention to what the internal cues are, rather than any external factors. This will enhance the confidence you have in listening to your own body and following those internal cues enabling you to increase how often you eat when you are hungry not out of appetite and stop when you are satisfied not full.  Encouraged pt to continue to eat balanced meals inclusive  of non starchy vegetables 2 times a day 7 days a week Encouraged pt to choose lean protein sources: limiting beef, pork, sausage, hotdogs, and lunch meat Encouraged pt to continue to drink a minium 64 fluid ounces with half being  plain water to satisfy proper hydration    Pre-Op Goals Progress & New Goals Re-engage/Continue: find a protein shake you like Continue: practice not drinking with meals; avoiding sugar sweetened beverages Continue: track protein Re-engage/Continue: practice meal prepping; getting new containers for meal prepping. continue: increase physical activity; walk at all three breaks at work. NEW: make enough dinner for lunch the next day New: add non starchy vegetable with lunch  Handouts Previously Provided Include  Bariatric MyPlate with food suggestion page Are You Ready handout Mindful Meals activity sheet  Learning Style & Readiness for Change Teaching method utilized: Visual & Auditory  Demonstrated degree of understanding via: Teach Back  Readiness Level: action Barriers to learning/adherence to lifestyle change: nothing identified  RD's Notes for next Visit  Patient progress toward chosen goals.   MONITORING & EVALUATION Dietary intake, weekly physical activity, body weight, and pre-op goals i  Next Steps  Patient is to return to [facility] for post op class Pt has completed visits. No further supervised visits required/recommended.

## 2022-10-11 ENCOUNTER — Encounter: Payer: Self-pay | Admitting: Cardiology

## 2022-10-11 ENCOUNTER — Ambulatory Visit: Payer: 59 | Attending: Cardiology | Admitting: Cardiology

## 2022-10-11 VITALS — BP 133/78 | HR 93 | Ht 65.0 in | Wt >= 6400 oz

## 2022-10-11 DIAGNOSIS — R0602 Shortness of breath: Secondary | ICD-10-CM

## 2022-10-11 DIAGNOSIS — Z86711 Personal history of pulmonary embolism: Secondary | ICD-10-CM

## 2022-10-11 NOTE — Patient Instructions (Signed)
    Follow-Up: At [facility], you and your health needs are our priority.  As part of our continuing mission to provide you with exceptional heart care, we have created designated Provider Care Teams.  These Care Teams include your primary Cardiologist (physician) and Advanced Practice Providers (APPs -  Physician Assistants and Nurse Practitioners) who all work together to provide you with the care you need, when you need it.  We recommend signing up for the patient portal called "MyChart".  Sign up information is provided on this After Visit Summary.  MyChart is used to connect with patients for Virtual Visits (Telemedicine).  Patients are able to view lab/test results, encounter notes, upcoming appointments, etc.  Non-urgent messages can be sent to your provider as well.   To learn more about what you can do with MyChart, go to [email or web address].    Your next appointment:   As needed        

## 2022-10-15 ENCOUNTER — Telehealth: Payer: Self-pay | Admitting: Hematology

## 2022-10-15 NOTE — Telephone Encounter (Signed)
Contacted patient to scheduled appointments. Patient is aware of appointments that are scheduled.   

## 2022-10-17 ENCOUNTER — Inpatient Hospital Stay: Payer: 59 | Attending: Oncology | Admitting: Hematology

## 2022-10-17 ENCOUNTER — Encounter: Payer: Self-pay | Admitting: Hematology

## 2022-10-17 DIAGNOSIS — Z86711 Personal history of pulmonary embolism: Secondary | ICD-10-CM | POA: Insufficient documentation

## 2022-10-17 NOTE — Progress Notes (Signed)
[facility]   [phone] Fax:([phone]) [phone]   Clinic Follow up Note   Patient Care Team: Tysinger, Kermit Balo, PA-C as PCP - General (Family Medicine) Rollene Rotunda, MD as PCP - Cardiology (Cardiology)  Date of Service:  10/17/2022  I connected with Tasha Hughes on 10/17/2022 at 10:00 AM EDT by telephone visit and verified that I am speaking with the correct person using two identifiers.  I discussed the limitations, risks, security and privacy concerns of performing an evaluation and management service by telephone and the availability of in person appointments. I also discussed with the patient that there may be a patient responsible charge related to this service. The patient expressed understanding and agreed to proceed.   Other persons participating in the visit and their role in the encounter:  no  Patient's location:  Home Provider's location:  [facility] Office  CHIEF COMPLAINT: f/u of Pulmonary embolism     ASSESSMENT & PLAN:  Tasha Hughes is a 32 y.o. female with   Personal history of pulmonary embolism She had a 1 episode of PE in November 2021.  Probably provoked by COVID-vaccine and oral contraceptives.  She also has other risk factors including morbid obesity and heavy smoking. -She was treated with 3 months of Eliquis, and has been off since then. -I discussed her hypercoagulopathy workup, which was basically negative except positive cardiolipin antibody IgM, with low titer 23.  This is not clinically significant, I recommend repeat test in 3-6 months. -We discussed preventative strategies to reduce her risk of future thrombosis, including active lifestyle, weight loss, compression stocks etc.  She has stopped smoking completely, not on oral contraceptives anymore (we discussed alternative such as nonestrogen containing IUD), and weight loss. -I discussed prophylactic anticoagulation after surgery, especially for orthopedic surgeries.  She will  have bariatric surgery with gastric sleeve, she anticipates to be mobile right after surgery, so her risk of thrombosis is not high.  If her surgery may reduce her mobility, I think it is reasonable to use prophylactic dose anticoagulation, such as Eliquis 2.5mg  bid for 2-4 weeks.   PLAN: -lab results reviewed,  I recommend repeating anticardioplin antibodies in 5 months. - Pt is clear to have bariatric surgery -pt agree to do a repeat lab -f/u with phone visit after next lab   SUMMARY OF ONCOLOGIC HISTORY: Oncology History   No history exists.     INTERVAL HISTORY:  Tasha Hughes was contacted for a follow up of Pulmonary embolism . She was last seen by me on 09/20/2022.    All other systems were reviewed with the patient and are negative.  MEDICAL HISTORY:  Past Medical History:  Diagnosis Date   Allergy    Chronic back pain    Former smoker    GERD (gastroesophageal reflux disease)    Obese    Polycystic ovarian disease    Pulmonary emboli (HCC) 2021    SURGICAL HISTORY: Past Surgical History:  Procedure Laterality Date   TONSILLECTOMY      I have reviewed the social history and family history with the patient and they are unchanged from previous note.  ALLERGIES:  is allergic to bee venom, other, penicillins, robitussin [guaifenesin], shrimp [shellfish allergy], soybean-containing drug products, sudafed [pseudoephedrine hcl], and gabapentin.  MEDICATIONS:  Current Outpatient Medications  Medication Sig Dispense Refill   EPINEPHrine 0.3 mg/0.3 mL IJ SOAJ injection Inject 0.3 mg into the muscle as needed for anaphylaxis. 1 each 1   metFORMIN (GLUCOPHAGE) 500 MG  tablet Take 500 mg by mouth every evening.      tiZANidine (ZANAFLEX) 4 MG tablet Take 1 tablet (4 mg total) by mouth 2 (two) times daily as needed for muscle spasms. 30 tablet 0   No current facility-administered medications for this visit.    PHYSICAL EXAMINATION: ECOG PERFORMANCE STATUS: 0 -  Asymptomatic  There were no vitals filed for this visit. Wt Readings from Last 3 Encounters:  10/11/22 (!) 401 lb 6.4 oz (182.1 kg)  10/10/22 (!) 401 lb (181.9 kg)  09/20/22 (!) 405 lb 8 oz (183.9 kg)    No vitals taken today, Exam not performed today  LABORATORY DATA:  I have reviewed the data as listed    Latest Ref Rng & Units 07/07/2022    9:00 PM 04/07/2020    8:56 PM 08/04/2017   10:58 AM  CBC  WBC 4.0 - 10.5 K/uL 10.7  11.3  6.0   Hemoglobin 12.0 - 15.0 g/dL 16.1  09.6  04.5   Hematocrit 36.0 - 46.0 % 37.4  43.0  39.7   Platelets 150 - 400 K/uL 353  281  277         Latest Ref Rng & Units 07/07/2022    9:00 PM 04/07/2020    8:56 PM 08/04/2017   10:58 AM  CMP  Glucose 70 - 99 mg/dL 409  811  92   BUN 6 - 20 mg/dL 10  21  12    Creatinine 0.44 - 1.00 mg/dL 9.14  7.82  9.56   Sodium 135 - 145 mmol/L 136  140  139   Potassium 3.5 - 5.1 mmol/L 2.9  3.7  3.6   Chloride 98 - 111 mmol/L 98  104  104   CO2 22 - 32 mmol/L 26  19  24    Calcium 8.9 - 10.3 mg/dL 8.4  9.3  9.0   Total Protein 6.5 - 8.1 g/dL 8.0   7.0   Total Bilirubin 0.3 - 1.2 mg/dL 0.3   0.4   Alkaline Phos 38 - 126 U/L 93   58   AST 15 - 41 U/L 40   16   ALT 0 - 44 U/L 56   19       RADIOGRAPHIC STUDIES: I have personally reviewed the radiological images as listed and agreed with the findings in the report. No results found.    Orders Placed This Encounter  Procedures   Cardiolipin antibodies, IgG, IgM, IgA*    Standing Status:   Future    Standing Expiration Date:   10/17/2023   All questions were answered. The patient knows to call the clinic with any problems, questions or concerns. No barriers to learning was detected. The total time spent in the appointment was 12 minutes.     Malachy Mood, MD 10/17/2022   Carolin Coy am acting as scribe for Malachy Mood, MD.   I have reviewed the above documentation for accuracy and completeness, and I agree with the above.

## 2022-10-17 NOTE — Assessment & Plan Note (Signed)
She had a 1 episode of PE in November 2021.  Probably provoked by COVID-vaccine and oral contraceptives.  She also has other risk factors including morbid obesity and heavy smoking. -She was treated with 3 months of Eliquis, and has been off since then. -I discussed her hypercoagulopathy workup, which was basically negative except positive cardiolipin antibody IgM, with low titer 23.  This is not clinically significant, I recommend repeat test in 3 months. -We discussed preventative strategies to reduce her risk of future thrombosis, including active lifestyle, weight loss, compression stocks etc.  She has stopped smoking completely, not on oral contraceptives anymore (we discussed alternative such as nonestrogen containing IUD), and weight loss. -I discussed prophylactic anticoagulation after surgery, especially for orthopedic surgeries.  She will have bariatric surgery with gastric sleeve, she anticipates to be mobile right after surgery, so her risk of thrombosis is not high.  If her surgery may reduce her mobility, I think it is reasonable to use prophylactic dose anticoagulation, such as Eliquis 2.5mg  bid for 2-4 weeks.

## 2022-10-18 ENCOUNTER — Encounter (HOSPITAL_COMMUNITY): Payer: 59

## 2022-10-25 ENCOUNTER — Encounter (HOSPITAL_COMMUNITY): Payer: Self-pay

## 2022-10-29 ENCOUNTER — Ambulatory Visit: Payer: Self-pay | Admitting: Surgery

## 2022-10-29 NOTE — H&P (Signed)
Tasha Hughes [id number]   Referring Provider:  Karyl Kinnier,* PCP-David Tysinger, PA-C   Subjective   Chief Complaint: No chief complaint on file.     History of Present Illness: Returns for follow-up regarding surgical treatment of morbid obesity.  Since our last visit she has completed the bariatric workup with no barriers identified.  She additionally went to the emergency department with fever and had a CT scan of her chest, abdomen and pelvis on 2/11 which notes hepatomegaly and degenerative changes at L4-L5 but otherwise nothing acute.  Her upper GI and chest x-ray were done on April 5, and this does show no hiatal hernia but small volume reflux and otherwise normal anatomy..  Her lab work was unremarkable with the exception of her hemoglobin A1c elevated at 6.4.  She has been cleared by psychology as well as by the dietitians.  She has followed up with Dr. Antoine Poche cardiology and deemed an acceptable risk for surgery.  Has also seen Dr. Mosetta Putt with heme-onc and underwent hypercoagulopathy workup which was negative aside from cardiolipin antibody IgM which was not clinically significant and will be retested in a few months; prophylactic dose anticoagulation for 2 to 4 weeks after surgery was recommended such as Eliquis 2.5 mg twice daily.  She has been busy over the last few months but in general is excited and looking forward to her surgery.  She has several questions to discuss today.  She has a collection of herbal teas and wants to make sure that that is okay to drink as well as wanting to know about certain supplements that she is considering taking long-term after bariatric surgery.   Initial visit 06/27/22: 32 year old woman with a history of allergy (prior episode of angioedema and anaphylaxis), chronic back pain, history of tobacco abuse (quit February 2022), GERD, PCOS, pulmonary emboli (November 2021, bilateral segmental, CTA also noted mild cardiomegaly; hypercoagulable  panel a few months ago with elevated factor VIII, PCP planning to recheck... Only took Eliquis for 3 months as this was attributed to either the COVID-vaccine or her oral contraceptive pills which she is no longer taking) and obesity who presents for consultation regarding surgical treatment of the latter.  She has always been overweight, but has struggled with obesity more in recent years.  She has tried numerous weight loss methods with minimal success.  Her most disappointing attempt was with body solutions, which was a $500 program requiring certain shakes and drinks, but she had an anaphylactic reaction to the shakes.  She typically will stick with the diet or exercise program for 1 to 2 weeks.  She reports significantly low energy levels, expected difficulty with exercise and mobility.   She has developed prediabetes, and also has PCOS.  Her goal is to improve her health because she ultimately would like to improve her odds for fertility and also wants to avoid developing further complications of obesity.  He reports a fairly well-balanced eating pattern, lots of big chicken, salmon.  She does have a proclivity for Posta.  She drinks ginger ale and V8 splash and does state that she probably needs to do less of those.  No previous abdominal surgeries.  Family history notable for diabetes in her father, heart disease/heart failure and hypertension in her mother.  Strong family history of obesity.  Former smoker, reports occasional alcohol use, no drug use.  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  Review of Systems: A complete review of systems  was obtained from the patient.  I have reviewed this information and discussed as appropriate with the patient.  See HPI as well for other ROS.   Medical History: Past Medical History:  Diagnosis Date   Diabetes mellitus without complication (CMS-HCC)    Pulmonary embolism (CMS-HCC)     There is no  problem list on file for this patient.   Past Surgical History:  Procedure Laterality Date   TONSILLECTOMY       Allergies  Allergen Reactions   Penicillins Anaphylaxis and Hives    Has patient had a PCN reaction causing immediate rash, facial/tongue/throat swelling, SOB or lightheadedness with hypotension: Yes  Has patient had a PCN reaction causing severe rash involving mucus membranes or skin necrosis: No  Has patient had a PCN reaction that required hospitalization Yes  Has patient had a PCN reaction occurring within the last 10 years: No  If all of the above answers are "NO", then may proceed with Cephalosporin use.   Pseudoephedrine Hcl Hives    Current Outpatient Medications on File Prior to Visit  Medication Sig Dispense Refill   metFORMIN (GLUCOPHAGE) 500 MG tablet TAKE 1 TABLET BY MOUTH TWICE DAILY AS DIRECTED     No current facility-administered medications on file prior to visit.    Family History  Problem Relation Age of Onset   Obesity Mother    High blood pressure (Hypertension) Mother    Hyperlipidemia (Elevated cholesterol) Mother    Coronary Artery Disease (Blocked arteries around heart) Mother    Obesity Father    Hyperlipidemia (Elevated cholesterol) Father    Diabetes Father      Social History   Tobacco Use  Smoking Status Former   Current packs/day: 0.00   Types: Cigarettes   Quit date: 2021   Years since quitting: 3.4  Smokeless Tobacco Never     Social History   Socioeconomic History   Marital status: Single  Tobacco Use   Smoking status: Former    Current packs/day: 0.00    Types: Cigarettes    Quit date: 2021    Years since quitting: 3.4   Smokeless tobacco: Never  Vaping Use   Vaping status: Never Used  Substance and Sexual Activity   Alcohol use: Yes    Comment: occasional   Drug use: Never    Objective:    There were no vitals filed for this visit.   There is no height or weight on file to calculate BMI.  Gen:  A&Ox3, no distress  Unlabored respirations  Assessment and Plan:  Diagnoses and all orders for this visit:  Morbid obesity (CMS/HHS-HCC) Comments: MI 30 equals 178 LB  PCOS (polycystic ovarian syndrome)  Chronic back pain, unspecified back location, unspecified back pain laterality  Pulmonary embolism, other, unspecified chronicity, unspecified whether acute cor pulmonale present (CMS/HHS-HCC)  Cardiomegaly  History of tobacco abuse     Remains a good candidate for sleeve gastrectomy.  We have previously discussed the surgery including technical aspects, the risks of bleeding, infection, pain, scarring, injury to intra-abdominal structures, staple line leak or abscess, chronic abdominal pain or nausea, new onset or worsened GERD, DVT/PE, pneumonia, heart attack, stroke, death, failure to reach weight loss goals and weight regain, hernia.  Discussed the typical pre-, peri-, and postoperative course.  Discussed the importance of lifelong behavioral changes to combat the chronic and relapsing disease which is obesity, and the possibility that she will ultimately also need medical therapy long-term.  She has completed the workup  with additional consultations as above and is ready to proceed.  Questions were welcomed and answered to her satisfaction today.    Shivank Pinedo Carlye Grippe, MD

## 2022-10-29 NOTE — H&P (View-Only) (Signed)
Tre[facility] Clerk [id number]   Referring Provider:  Aniyah Nobis Amanda,* PCP-David Tysinger, PA-C   Subjective   Chief Complaint: No chief complaint on file.     History of Present Illness: Returns for follow-up regarding surgical treatment of morbid obesity.  Since our last visit she has completed the bariatric workup with no barriers identified.  She additionally went to the emergency department with fever and had a CT scan of her chest, abdomen and pelvis on 2/11 which notes hepatomegaly and degenerative changes at L4-L5 but otherwise nothing acute.  Her upper GI and chest x-ray were done on April 5, and this does show no hiatal hernia but small volume reflux and otherwise normal anatomy..  Her lab work was unremarkable with the exception of her hemoglobin A1c elevated at 6.4.  She has been cleared by psychology as well as by the dietitians.  She has followed up with Dr. Hochrein/ cardiology and deemed an acceptable risk for surgery.  Has also seen Dr. Feng with heme-onc and underwent hypercoagulopathy workup which was negative aside from cardiolipin antibody IgM which was not clinically significant and will be retested in a few months; prophylactic dose anticoagulation for 2 to 4 weeks after surgery was recommended such as Eliquis 2.5 mg twice daily.  She has been busy over the last few months but in general is excited and looking forward to her surgery.  She has several questions to discuss today.  She has a collection of herbal teas and wants to make sure that that is okay to drink as well as wanting to know about certain supplements that she is considering taking long-term after bariatric surgery.   Initial visit 06/27/22: 32-year-old woman with a history of allergy (prior episode of angioedema and anaphylaxis), chronic back pain, history of tobacco abuse (quit February 2022), GERD, PCOS, pulmonary emboli (November 2021, bilateral segmental, CTA also noted mild cardiomegaly; hypercoagulable  panel a few months ago with elevated factor VIII, PCP planning to recheck... Only took Eliquis for 3 months as this was attributed to either the COVID-vaccine or her oral contraceptive pills which she is no longer taking) and obesity who presents for consultation regarding surgical treatment of the latter.  She has always been overweight, but has struggled with obesity more in recent years.  She has tried numerous weight loss methods with minimal success.  Her most disappointing attempt was with body solutions, which was a $500 program requiring certain shakes and drinks, but she had an anaphylactic reaction to the shakes.  She typically will stick with the diet or exercise program for 1 to 2 weeks.  She reports significantly low energy levels, expected difficulty with exercise and mobility.   She has developed prediabetes, and also has PCOS.  Her goal is to improve her health because she ultimately would like to improve her odds for fertility and also wants to avoid developing further complications of obesity.  He reports a fairly well-balanced eating pattern, lots of big chicken, salmon.  She does have a proclivity for Posta.  She drinks ginger ale and V8 splash and does state that she probably needs to do less of those.  No previous abdominal surgeries.  Family history notable for diabetes in her father, heart disease/heart failure and hypertension in her mother.  Strong family history of obesity.  Former smoker, reports occasional alcohol use, no drug use.  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  Review of Systems: A complete review of systems   was obtained from the patient.  I have reviewed this information and discussed as appropriate with the patient.  See HPI as well for other ROS.   Medical History: Past Medical History:  Diagnosis Date   Diabetes mellitus without complication (CMS-HCC)    Pulmonary embolism (CMS-HCC)     There is no  problem list on file for this patient.   Past Surgical History:  Procedure Laterality Date   TONSILLECTOMY       Allergies  Allergen Reactions   Penicillins Anaphylaxis and Hives    Has patient had a PCN reaction causing immediate rash, facial/tongue/throat swelling, SOB or lightheadedness with hypotension: Yes  Has patient had a PCN reaction causing severe rash involving mucus membranes or skin necrosis: No  Has patient had a PCN reaction that required hospitalization Yes  Has patient had a PCN reaction occurring within the last 10 years: No  If all of the above answers are "NO", then may proceed with Cephalosporin use.   Pseudoephedrine Hcl Hives    Current Outpatient Medications on File Prior to Visit  Medication Sig Dispense Refill   metFORMIN (GLUCOPHAGE) 500 MG tablet TAKE 1 TABLET BY MOUTH TWICE DAILY AS DIRECTED     No current facility-administered medications on file prior to visit.    Family History  Problem Relation Age of Onset   Obesity Mother    High blood pressure (Hypertension) Mother    Hyperlipidemia (Elevated cholesterol) Mother    Coronary Artery Disease (Blocked arteries around heart) Mother    Obesity Father    Hyperlipidemia (Elevated cholesterol) Father    Diabetes Father      Social History   Tobacco Use  Smoking Status Former   Current packs/day: 0.00   Types: Cigarettes   Quit date: 2021   Years since quitting: 3.4  Smokeless Tobacco Never     Social History   Socioeconomic History   Marital status: Single  Tobacco Use   Smoking status: Former    Current packs/day: 0.00    Types: Cigarettes    Quit date: 2021    Years since quitting: 3.4   Smokeless tobacco: Never  Vaping Use   Vaping status: Never Used  Substance and Sexual Activity   Alcohol use: Yes    Comment: occasional   Drug use: Never    Objective:    There were no vitals filed for this visit.   There is no height or weight on file to calculate BMI.  Gen:  A&Ox3, no distress  Unlabored respirations  Assessment and Plan:  Diagnoses and all orders for this visit:  Morbid obesity (CMS/HHS-HCC) Comments: MI 30 equals 178 LB  PCOS (polycystic ovarian syndrome)  Chronic back pain, unspecified back location, unspecified back pain laterality  Pulmonary embolism, other, unspecified chronicity, unspecified whether acute cor pulmonale present (CMS/HHS-HCC)  Cardiomegaly  History of tobacco abuse     Remains a good candidate for sleeve gastrectomy.  We have previously discussed the surgery including technical aspects, the risks of bleeding, infection, pain, scarring, injury to intra-abdominal structures, staple line leak or abscess, chronic abdominal pain or nausea, new onset or worsened GERD, DVT/PE, pneumonia, heart attack, stroke, death, failure to reach weight loss goals and weight regain, hernia.  Discussed the typical pre-, peri-, and postoperative course.  Discussed the importance of lifelong behavioral changes to combat the chronic and relapsing disease which is obesity, and the possibility that she will ultimately also need medical therapy long-term.  She has completed the workup   with additional consultations as above and is ready to proceed.  Questions were welcomed and answered to her satisfaction today.    Tiarna Koppen AMANDA Jaelan Rasheed, MD   

## 2022-11-04 ENCOUNTER — Encounter: Payer: 59 | Attending: Surgery | Admitting: Skilled Nursing Facility1

## 2022-11-04 ENCOUNTER — Encounter: Payer: Self-pay | Admitting: Skilled Nursing Facility1

## 2022-11-04 VITALS — Wt >= 6400 oz

## 2022-11-04 DIAGNOSIS — E669 Obesity, unspecified: Secondary | ICD-10-CM | POA: Insufficient documentation

## 2022-11-04 NOTE — Progress Notes (Signed)
Pre-Operative Nutrition Class:    Patient was seen on 11/04/2022 for Pre-Operative Bariatric Surgery Education at [facility]    Surgery date: 11/26/2022 Surgery type: sleeve Start weight at [facility]: 400 Weight today: 402    The following the learning objectives were met by the patient during this course: Identify Pre-Op Dietary Goals and will begin 2 weeks pre-operatively Identify appropriate sources of fluids and proteins  State protein recommendations and appropriate sources pre and post-operatively Identify Post-Operative Dietary Goals and will follow for 2 weeks post-operatively Identify appropriate multivitamin and calcium sources Describe the need for physical activity post-operatively and will follow MD recommendations State when to call healthcare provider regarding medication questions or post-operative complications When having a diagnosis of diabetes understanding hypoglycemia symptoms and the inclusion of 1 complex carbohydrate per meal  Handouts given during class include: Pre-Op Bariatric Surgery Diet Handout Protein Shake Handout Post-Op Bariatric Surgery Nutrition Handout BELT Program Information Flyer Support Group Information Flyer [facility] Bariatric Supplements Price List  Follow-Up Plan: Patient will follow-up at [facility] 2 weeks post operatively for diet advancement per MD.

## 2022-11-13 NOTE — Patient Instructions (Addendum)
SURGICAL WAITING ROOM VISITATION Patients having surgery or a procedure may have no more than 2 support people in the waiting area - these visitors may rotate in the visitor waiting room.   Due to an increase in RSV and influenza rates and associated hospitalizations, children ages 25 and under may not visit patients in [facility] hospitals. If the patient needs to stay at the hospital during part of their recovery, the visitor guidelines for inpatient rooms apply.  PRE-OP VISITATION  Pre-op nurse will coordinate an appropriate time for 1 support person to accompany the patient in pre-op.  This support person may not rotate.  This visitor will be contacted when the time is appropriate for the visitor to come back in the pre-op area.  Please refer to the [facility] website for the visitor guidelines for Inpatients (after your surgery is over and you are in a regular room).  You are not required to quarantine at this time prior to your surgery. However, you must do this: Hand Hygiene often Do NOT share personal items Notify your provider if you are in close contact with someone who has COVID or you develop fever 100.4 or greater, new onset of sneezing, cough, sore throat, shortness of breath or body aches.  If you test positive for Covid or have been in contact with anyone that has tested positive in the last 10 days please notify you surgeon.    Your procedure is scheduled on:  Tuesday  November 26, 2022  Report to [facility] Main Entrance: Leota Jacobsen entrance where the [employer] is available.   Report to admitting at:  05:15  AM  +++++Call this number if you have any questions or problems the morning of surgery [phone]  Do not eat food after Midnight the night prior to your surgery/procedure.  After Midnight you may have the following liquids until   04:30  AM DAY OF SURGERY  Clear Liquid Diet Water Black Coffee (sugar ok, NO MILK/CREAM OR CREAMERS)  Tea (sugar ok, NO  MILK/CREAM OR CREAMERS) regular and decaf                             Plain Jell-O  with no fruit (NO RED)                                           Fruit ices (not with fruit pulp, NO RED)                                     Popsicles (NO RED)                                                                  Juice: apple, WHITE grape, WHITE cranberry Sports drinks like Gatorade or Powerade (NO RED)                   The day of surgery:  Drink ONE (1) Pre-Surgery G2 at 04:30   AM the morning of surgery. Drink in one sitting.  Do not sip.  This drink was given to you during your hospital pre-op appointment visit. Nothing else to drink after completing the Pre-Surgery  G2 : No candy, chewing gum or throat lozenges.    FOLLOW BOWEL PREP AND ANY ADDITIONAL PRE OP INSTRUCTIONS YOU RECEIVED FROM YOUR SURGEON'S OFFICE!!!   Oral Hygiene is also important to reduce your risk of infection.        Remember - BRUSH YOUR TEETH THE MORNING OF SURGERY WITH YOUR REGULAR TOOTHPASTE  Do NOT smoke after Midnight the night before surgery.  Take ONLY these medicines the morning of surgery with A SIP OF WATER: none                   You may not have any metal on your body including hair pins, jewelry, and body piercing  Do not wear make-up, lotions, powders, perfumes or deodorant  Do not wear nail polish including gel and S&S, artificial / acrylic nails, or any other type of covering on natural nails including finger and toenails. If you have artificial nails, gel coating, etc., that needs to be removed by a nail salon, Please have this removed prior to surgery. Not doing so may mean that your surgery could be cancelled or delayed if the Surgeon or anesthesia staff feels like they are unable to monitor you safely.   Do not shave 48 hours prior to surgery to avoid nicks in your skin which may contribute to postoperative infections.    You may bring a small overnight bag with you on the day of surgery,  only pack items that are not valuable. [facility] IS NOT RESPONSIBLE   FOR VALUABLES THAT ARE LOST OR STOLEN.   Do not bring your home medications to the hospital. The Pharmacy will dispense medications listed on your medication list to you during your admission in the Hospital.  Please read over the following fact sheets you were given: IF YOU HAVE QUESTIONS ABOUT YOUR PRE-OP INSTRUCTIONS, PLEASE CALL [phone].     - Preparing for Surgery Before surgery, you can play an important role.  Because skin is not sterile, your skin needs to be as free of germs as possible.  You can reduce the number of germs on your skin by washing with CHG (chlorahexidine gluconate) soap before surgery.  CHG is an antiseptic cleaner which kills germs and bonds with the skin to continue killing germs even after washing. Please DO NOT use if you have an allergy to CHG or antibacterial soaps.  If your skin becomes reddened/irritated stop using the CHG and inform your nurse when you arrive at Short Stay. Do not shave (including legs and underarms) for at least 48 hours prior to the first CHG shower.  You may shave your face/neck.  Please follow these instructions carefully:  1.  Shower with CHG Soap the night before surgery and the  morning of surgery.  2.  If you choose to wash your hair, wash your hair first as usual with your normal  shampoo.  3.  After you shampoo, rinse your hair and body thoroughly to remove the shampoo.                             4.  Use CHG as you would any other liquid soap.  You can apply chg directly to the skin and wash.  Gently with a scrungie or clean washcloth.  5.  Apply  the CHG Soap to your body ONLY FROM THE NECK DOWN.   Do not use on face/ open                           Wound or open sores. Avoid contact with eyes, ears mouth and genitals (private parts).                       Wash face,  Genitals (private parts) with your normal soap.             6.  Wash thoroughly,  paying special attention to the area where your  surgery  will be performed.  7.  Thoroughly rinse your body with warm water from the neck down.  8.  DO NOT shower/wash with your normal soap after using and rinsing off the CHG Soap.            9.  Pat yourself dry with a clean towel.            10.  Wear clean pajamas.            11.  Place clean sheets on your bed the night of your first shower and do not  sleep with pets.  ON THE DAY OF SURGERY : Do not apply any lotions/deodorants the morning of surgery.  Please wear clean clothes to the hospital/surgery center.    FAILURE TO FOLLOW THESE INSTRUCTIONS MAY RESULT IN THE CANCELLATION OF YOUR SURGERY  PATIENT SIGNATURE_________________________________  NURSE SIGNATURE__________________________________  ________________________________________________________________________       Rogelia Mire    An incentive spirometer is a tool that can help keep your lungs clear and active. This tool measures how well you are filling your lungs with each breath. Taking long deep breaths may help reverse or decrease the chance of developing breathing (pulmonary) problems (especially infection) following: A long period of time when you are unable to move or be active. BEFORE THE PROCEDURE  If the spirometer includes an indicator to show your best effort, your nurse or respiratory therapist will set it to a desired goal. If possible, sit up straight or lean slightly forward. Try not to slouch. Hold the incentive spirometer in an upright position. INSTRUCTIONS FOR USE  Sit on the edge of your bed if possible, or sit up as far as you can in bed or on a chair. Hold the incentive spirometer in an upright position. Breathe out normally. Place the mouthpiece in your mouth and seal your lips tightly around it. Breathe in slowly and as deeply as possible, raising the piston or the ball toward the top of the column. Hold your breath for 3-5  seconds or for as long as possible. Allow the piston or ball to fall to the bottom of the column. Remove the mouthpiece from your mouth and breathe out normally. Rest for a few seconds and repeat Steps 1 through 7 at least 10 times every 1-2 hours when you are awake. Take your time and take a few normal breaths between deep breaths. The spirometer may include an indicator to show your best effort. Use the indicator as a goal to work toward during each repetition. After each set of 10 deep breaths, practice coughing to be sure your lungs are clear. If you have an incision (the cut made at the time of surgery), support your incision when coughing by placing a pillow or rolled up towels firmly against it.  Once you are able to get out of bed, walk around indoors and cough well. You may stop using the incentive spirometer when instructed by your caregiver.  RISKS AND COMPLICATIONS Take your time so you do not get dizzy or light-headed. If you are in pain, you may need to take or ask for pain medication before doing incentive spirometry. It is harder to take a deep breath if you are having pain. AFTER USE Rest and breathe slowly and easily. It can be helpful to keep track of a log of your progress. Your caregiver can provide you with a simple table to help with this. If you are using the spirometer at home, follow these instructions: SEEK MEDICAL CARE IF:  You are having difficultly using the spirometer. You have trouble using the spirometer as often as instructed. Your pain medication is not giving enough relief while using the spirometer. You develop fever of 100.5 F (38.1 C) or higher.                                                                                                    SEEK IMMEDIATE MEDICAL CARE IF:  You cough up bloody sputum that had not been present before. You develop fever of 102 F (38.9 C) or greater. You develop worsening pain at or near the incision site. MAKE SURE YOU:   Understand these instructions. Will watch your condition. Will get help right away if you are not doing well or get worse. Document Released: 09/23/2006 Document Revised: 08/05/2011 Document Reviewed: 11/24/2006 [facility] Patient Information 2014 [place], [place].    WHAT IS A BLOOD TRANSFUSION? Blood Transfusion Information  A transfusion is the replacement of blood or some of its parts. Blood is made up of multiple cells which provide different functions. Red blood cells carry oxygen and are used for blood loss replacement. White blood cells fight against infection. Platelets control bleeding. Plasma helps clot blood. Other blood products are available for specialized needs, such as hemophilia or other clotting disorders. BEFORE THE TRANSFUSION  Who gives blood for transfusions?  Healthy volunteers who are fully evaluated to make sure their blood is safe. This is blood bank blood. Transfusion therapy is the safest it has ever been in the practice of medicine. Before blood is taken from a donor, a complete history is taken to make sure that person has no history of diseases nor engages in risky social behavior (examples are intravenous drug use or sexual activity with multiple partners). The donor's travel history is screened to minimize risk of transmitting infections, such as malaria. The donated blood is tested for signs of infectious diseases, such as HIV and hepatitis. The blood is then tested to be sure it is compatible with you in order to minimize the chance of a transfusion reaction. If you or a relative donates blood, this is often done in anticipation of surgery and is not appropriate for emergency situations. It takes many days to process the donated blood. RISKS AND COMPLICATIONS Although transfusion therapy is very safe and saves many lives, the main dangers of transfusion include:  Getting an infectious disease. Developing a transfusion reaction. This is an allergic reaction to  something in the blood you were given. Every precaution is taken to prevent this. The decision to have a blood transfusion has been considered carefully by your caregiver before blood is given. Blood is not given unless the benefits outweigh the risks. AFTER THE TRANSFUSION Right after receiving a blood transfusion, you will usually feel much better and more energetic. This is especially true if your red blood cells have gotten low (anemic). The transfusion raises the level of the red blood cells which carry oxygen, and this usually causes an energy increase. The nurse administering the transfusion will monitor you carefully for complications. HOME CARE INSTRUCTIONS  No special instructions are needed after a transfusion. You may find your energy is better. Speak with your caregiver about any limitations on activity for underlying diseases you may have. SEEK MEDICAL CARE IF:  Your condition is not improving after your transfusion. You develop redness or irritation at the intravenous (IV) site. SEEK IMMEDIATE MEDICAL CARE IF:  Any of the following symptoms occur over the next 12 hours: Shaking chills. You have a temperature by mouth above 102 F (38.9 C), not controlled by medicine. Chest, back, or muscle pain. People around you feel you are not acting correctly or are confused. Shortness of breath or difficulty breathing. Dizziness and fainting. You get a rash or develop hives. You have a decrease in urine output. Your urine turns a dark color or changes to pink, red, or brown. Any of the following symptoms occur over the next 10 days: You have a temperature by mouth above 102 F (38.9 C), not controlled by medicine. Shortness of breath. Weakness after normal activity. The white part of the eye turns yellow (jaundice). You have a decrease in the amount of urine or are urinating less often. Your urine turns a dark color or changes to pink, red, or brown. Document Released: 05/10/2000  Document Revised: 08/05/2011 Document Reviewed: 12/28/2007 [facility] Patient Information 2014 [place], [place].  _______________________________________________________________________

## 2022-11-13 NOTE — Progress Notes (Addendum)
COVID Vaccine received:  []  No [x]  Yes Date of any COVID positive Test in last 90 days:  none  PCP - Crosby Oyster, PA-C Cardiologist - Rollene Rotunda, MD  cardiac clearance 10-11-2022 note Hematology- Malachy Mood, MD  medical clearance 09-20-2022 note  Chest x-ray - 09-01-2022  2v  Epic EKG -  08-30-2022  Epic Stress Test -  ECHO - 09-23-2022  Epic Cardiac Cath -   PCR screen: []  Ordered & Completed           []   No Order but Needs PROFEND           [x]   N/A for this surgery  Surgery Plan:  []  Ambulatory                            [x]  Outpatient in bed                            []  Admit  Anesthesia:    [x]  General  []  Spinal                           []   Choice []   MAC  Bowel Prep - []  No  [x]   Yes ___Bariatric preop diet___  Pacemaker / ICD device [x]  No []  Yes   Spinal Cord Stimulator:[x]  No []  Yes       History of Sleep Apnea? []  No []  Yes   CPAP used?- []  No []  Yes    Does the patient monitor blood sugar?          [x]  No []  Yes  []  N/A Last A1c was 6.4 on 10-04-2022  Patient has: []  NO Hx DM   [x]  Pre-DM                 []  DM1  []   DM2 Does patient have a [employer] or Dexacom? [x]  No []  Yes   Fasting Blood Sugar Ranges-  Checks Blood Sugar __0_ times a day  Other Diabetic medications/ instructions: Metformin  500 mg every morning. Takes for PCOS.   Hold on DOS  Blood Thinner / Instructions: None Aspirin Instructions:  none  ERAS Protocol Ordered: []  No  [x]  Yes PRE-SURGERY []  ENSURE  [x]  G2  Patient is to be NPO after: 04:30 am  Activity level: Patient is able to climb a flight of stairs without difficulty; [x]  No CP   but would have _SOB. Patient can perform ADLs without assistance.   Anesthesia review: Hx PE 2021 after Covid vax, Leukocytosis, GERD, Pre-DM, PCOS, Mild CM, DOE- Dr. Antoine Poche saw- negative workup,   Patient denies shortness of breath, fever, cough and chest pain at PAT appointment.  Patient verbalized understanding and agreement to the  Pre-Surgical Instructions that were given to them at this PAT appointment. Patient was also educated of the need to review these PAT instructions again prior to her surgery.I reviewed the appropriate phone numbers to call if they have any and questions or concerns.

## 2022-11-15 ENCOUNTER — Encounter (HOSPITAL_COMMUNITY): Payer: Self-pay

## 2022-11-15 ENCOUNTER — Encounter (HOSPITAL_COMMUNITY)
Admission: RE | Admit: 2022-11-15 | Discharge: 2022-11-15 | Disposition: A | Discharge status: Home / Self Care | Payer: 59 | Source: Ambulatory Visit | Attending: Surgery | Admitting: Surgery

## 2022-11-15 ENCOUNTER — Other Ambulatory Visit: Payer: Self-pay

## 2022-11-15 VITALS — BP 145/82 | HR 98 | Temp 98.3°F | Resp 24 | Ht 65.0 in | Wt >= 6400 oz

## 2022-11-15 DIAGNOSIS — Z01812 Encounter for preprocedural laboratory examination: Secondary | ICD-10-CM | POA: Diagnosis present

## 2022-11-15 DIAGNOSIS — Z01818 Encounter for other preprocedural examination: Secondary | ICD-10-CM

## 2022-11-15 HISTORY — DX: Elevated white blood cell count, unspecified: D72.829

## 2022-11-15 HISTORY — DX: Anemia, unspecified: D64.9

## 2022-11-15 HISTORY — DX: Prediabetes: R73.03

## 2022-11-15 LAB — CBC WITH DIFFERENTIAL/PLATELET
Abs Immature Granulocytes: 0.01 10*3/uL (ref 0.00–0.07)
Basophils Absolute: 0.1 10*3/uL (ref 0.0–0.1)
Basophils Relative: 1 %
Eosinophils Absolute: 0.3 10*3/uL (ref 0.0–0.5)
Eosinophils Relative: 4 %
HCT: 40.2 % (ref 36.0–46.0)
Hemoglobin: 12.1 g/dL (ref 12.0–15.0)
Immature Granulocytes: 0 %
Lymphocytes Relative: 30 %
Lymphs Abs: 2.4 10*3/uL (ref 0.7–4.0)
MCH: 24.3 pg — ABNORMAL LOW (ref 26.0–34.0)
MCHC: 30.1 g/dL (ref 30.0–36.0)
MCV: 80.9 fL (ref 80.0–100.0)
Monocytes Absolute: 0.6 10*3/uL (ref 0.1–1.0)
Monocytes Relative: 8 %
Neutro Abs: 4.6 10*3/uL (ref 1.7–7.7)
Neutrophils Relative %: 57 %
Platelets: 347 10*3/uL (ref 150–400)
RBC: 4.97 MIL/uL (ref 3.87–5.11)
RDW: 15.7 % — ABNORMAL HIGH (ref 11.5–15.5)
WBC: 8.1 10*3/uL (ref 4.0–10.5)
nRBC: 0 % (ref 0.0–0.2)

## 2022-11-15 LAB — COMPREHENSIVE METABOLIC PANEL
ALT: 26 U/L (ref 0–44)
AST: 15 U/L (ref 15–41)
Albumin: 3.4 g/dL — ABNORMAL LOW (ref 3.5–5.0)
Alkaline Phosphatase: 67 U/L (ref 38–126)
Anion gap: 9 (ref 5–15)
BUN: 17 mg/dL (ref 6–20)
CO2: 26 mmol/L (ref 22–32)
Calcium: 9 mg/dL (ref 8.9–10.3)
Chloride: 101 mmol/L (ref 98–111)
Creatinine, Ser: 0.55 mg/dL (ref 0.44–1.00)
GFR, Estimated: >60 mL/min (ref >60)
Glucose, Bld: 102 mg/dL — ABNORMAL HIGH (ref 70–99)
Potassium: 3.9 mmol/L (ref 3.5–5.1)
Sodium: 136 mmol/L (ref 135–145)
Total Bilirubin: 0.3 mg/dL (ref 0.3–1.2)
Total Protein: 7.7 g/dL (ref 6.5–8.1)

## 2022-11-15 LAB — TYPE AND SCREEN
ABO/RH(D): B POS
Antibody Screen: NEGATIVE

## 2022-11-25 ENCOUNTER — Other Ambulatory Visit (HOSPITAL_COMMUNITY): Payer: Self-pay

## 2022-11-25 NOTE — Progress Notes (Addendum)
Bariatric Surgery Team Clinical Pharmacy Note:  VTE Prophylaxis               Pharmacy was asked to assist with plans for VTE prophylaxis in this 32 year old female scheduled for sleeve gastrectomy on 11/26/2022.  PMH includes obesity, polycystic ovarian syndrome, and pulmonary embolism.  The pulmonary embolism occurred in 2021 a few months after starting combination oral contraceptive and a few weeks following COVID vaccination.  Patient was treated with Eliquis for 3 months as the pulmonary embolism was considered a provoked event.  Patient was seen by Dr. Mosetta Putt for hematology consultation and preoperative clearance on 09/20/22 with follow-up on 10/07/22.  Hematology recommendation was that if the surgery may temporarily reduce mobility, post-discharge prophylactic anticoagulation would be reasonable, such as Eliquis 2.5 mg BID for 2 to 4 weeks.    Given the planned procedure (sleeve gastrectomy) and history of pulmonary embolism, our Bariatric Surgery Team VTE Risk Algorithm recommends continuing pharmacological prophylaxis for approximately 4 weeks post-discharge.    Recommendations for VTE prophylaxis: Preoperative:    Heparin 5000 units subcutaneously x1 dose on morning of surgery POD#0 at 10pm:   Begin enoxaparin 30 mg subcutaneously every 12 hours At discharge:   DC enoxaparin and begin Eliquis 2.5 mg twice a day for 4 weeks  Pharmacy patient advocate team reports this patient's estimated co-pay for the Eliquis prescription would be $40.  Thank you for allowing [facility] to assist in this patient's care.   Elie Goody, PharmD, BCPS Clinical Pharmacist 11/25/2022  12:52 PM

## 2022-11-26 ENCOUNTER — Ambulatory Visit (HOSPITAL_BASED_OUTPATIENT_CLINIC_OR_DEPARTMENT_OTHER): Payer: 59 | Admitting: Anesthesiology

## 2022-11-26 ENCOUNTER — Other Ambulatory Visit: Payer: Self-pay

## 2022-11-26 ENCOUNTER — Encounter (HOSPITAL_COMMUNITY): Payer: Self-pay | Admitting: Surgery

## 2022-11-26 ENCOUNTER — Ambulatory Visit (HOSPITAL_COMMUNITY)
Admission: RE | Admit: 2022-11-26 | Discharge: 2022-11-27 | Disposition: A | Discharge status: Home / Self Care | Payer: 59 | Source: Ambulatory Visit | Attending: Surgery | Admitting: Surgery

## 2022-11-26 ENCOUNTER — Ambulatory Visit (HOSPITAL_COMMUNITY): Payer: 59 | Admitting: Anesthesiology

## 2022-11-26 ENCOUNTER — Encounter (HOSPITAL_COMMUNITY)
Admission: RE | Disposition: A | Discharge status: Home / Self Care | Payer: Self-pay | Source: Ambulatory Visit | Attending: Surgery

## 2022-11-26 DIAGNOSIS — Z833 Family history of diabetes mellitus: Secondary | ICD-10-CM | POA: Insufficient documentation

## 2022-11-26 DIAGNOSIS — Z7984 Long term (current) use of oral hypoglycemic drugs: Secondary | ICD-10-CM

## 2022-11-26 DIAGNOSIS — E282 Polycystic ovarian syndrome: Secondary | ICD-10-CM | POA: Insufficient documentation

## 2022-11-26 DIAGNOSIS — Z87891 Personal history of nicotine dependence: Secondary | ICD-10-CM | POA: Diagnosis not present

## 2022-11-26 DIAGNOSIS — Z6841 Body Mass Index (BMI) 40.0 and over, adult: Secondary | ICD-10-CM | POA: Insufficient documentation

## 2022-11-26 DIAGNOSIS — Z8249 Family history of ischemic heart disease and other diseases of the circulatory system: Secondary | ICD-10-CM | POA: Insufficient documentation

## 2022-11-26 DIAGNOSIS — E119 Type 2 diabetes mellitus without complications: Secondary | ICD-10-CM

## 2022-11-26 DIAGNOSIS — K219 Gastro-esophageal reflux disease without esophagitis: Secondary | ICD-10-CM | POA: Diagnosis not present

## 2022-11-26 DIAGNOSIS — I517 Cardiomegaly: Secondary | ICD-10-CM | POA: Diagnosis not present

## 2022-11-26 DIAGNOSIS — G8929 Other chronic pain: Secondary | ICD-10-CM | POA: Diagnosis not present

## 2022-11-26 DIAGNOSIS — K760 Fatty (change of) liver, not elsewhere classified: Secondary | ICD-10-CM | POA: Diagnosis not present

## 2022-11-26 DIAGNOSIS — Z8349 Family history of other endocrine, nutritional and metabolic diseases: Secondary | ICD-10-CM | POA: Insufficient documentation

## 2022-11-26 DIAGNOSIS — Z09 Encounter for follow-up examination after completed treatment for conditions other than malignant neoplasm: Secondary | ICD-10-CM | POA: Diagnosis not present

## 2022-11-26 DIAGNOSIS — Z01818 Encounter for other preprocedural examination: Secondary | ICD-10-CM

## 2022-11-26 DIAGNOSIS — Z86711 Personal history of pulmonary embolism: Secondary | ICD-10-CM | POA: Diagnosis not present

## 2022-11-26 HISTORY — PX: LAPAROSCOPIC GASTRIC SLEEVE RESECTION: SHX5895

## 2022-11-26 HISTORY — PX: UPPER GI ENDOSCOPY: SHX6162

## 2022-11-26 LAB — GLUCOSE, CAPILLARY
Glucose-Capillary: 118 mg/dL — ABNORMAL HIGH (ref 70–99)
Glucose-Capillary: 119 mg/dL — ABNORMAL HIGH (ref 70–99)
Glucose-Capillary: 133 mg/dL — ABNORMAL HIGH (ref 70–99)
Glucose-Capillary: 136 mg/dL — ABNORMAL HIGH (ref 70–99)
Glucose-Capillary: 89 mg/dL (ref 70–99)

## 2022-11-26 LAB — ABO/RH: ABO/RH(D): B POS

## 2022-11-26 LAB — HCG, SERUM, QUALITATIVE: Preg, Serum: NEGATIVE

## 2022-11-26 SURGERY — GASTRECTOMY, SLEEVE, LAPAROSCOPIC
Anesthesia: General

## 2022-11-26 MED ORDER — ACETAMINOPHEN 10 MG/ML IV SOLN
INTRAVENOUS | Status: AC
  Filled 2022-11-26: qty 100

## 2022-11-26 MED ORDER — ESMOLOL HCL 100 MG/10ML IV SOLN
INTRAVENOUS | Status: DC | PRN
  Administered 2022-11-26 (×2): 30 mg via INTRAVENOUS

## 2022-11-26 MED ORDER — SUCCINYLCHOLINE CHLORIDE 200 MG/10ML IV SOSY
PREFILLED_SYRINGE | INTRAVENOUS | Status: DC | PRN
  Administered 2022-11-26: 140 mg via INTRAVENOUS

## 2022-11-26 MED ORDER — PROPOFOL 10 MG/ML IV BOLUS
INTRAVENOUS | Status: AC
  Filled 2022-11-26: qty 20

## 2022-11-26 MED ORDER — PROPOFOL 10 MG/ML IV BOLUS
INTRAVENOUS | Status: DC | PRN
  Administered 2022-11-26: 200 mg via INTRAVENOUS

## 2022-11-26 MED ORDER — AMISULPRIDE (ANTIEMETIC) 5 MG/2ML IV SOLN
INTRAVENOUS | Status: AC
  Filled 2022-11-26: qty 4

## 2022-11-26 MED ORDER — PROMETHAZINE HCL 25 MG/ML IJ SOLN: 6.2500 mg | INTRAMUSCULAR | Status: DC | PRN

## 2022-11-26 MED ORDER — LACTATED RINGERS IR SOLN
Status: DC | PRN
  Administered 2022-11-26: 1000 mL

## 2022-11-26 MED ORDER — ESMOLOL HCL 100 MG/10ML IV SOLN
INTRAVENOUS | Status: AC
  Filled 2022-11-26: qty 10

## 2022-11-26 MED ORDER — ACETAMINOPHEN 500 MG PO TABS
1000.0000 mg | ORAL_TABLET | Freq: Three times a day (TID) | ORAL | Status: DC
  Administered 2022-11-26 – 2022-11-27 (×2): 1000 mg via ORAL
  Filled 2022-11-26 (×2): qty 2

## 2022-11-26 MED ORDER — MIDAZOLAM HCL 2 MG/2ML IJ SOLN
INTRAMUSCULAR | Status: AC
  Filled 2022-11-26: qty 2

## 2022-11-26 MED ORDER — METOPROLOL TARTRATE 5 MG/5ML IV SOLN: 5.0000 mg | Freq: Four times a day (QID) | INTRAVENOUS | Status: DC | PRN

## 2022-11-26 MED ORDER — AMISULPRIDE (ANTIEMETIC) 5 MG/2ML IV SOLN
10.0000 mg | Freq: Once | INTRAVENOUS | Status: AC | PRN
  Administered 2022-11-26: 10 mg via INTRAVENOUS

## 2022-11-26 MED ORDER — HYDRALAZINE HCL 20 MG/ML IJ SOLN
INTRAMUSCULAR | Status: AC
  Filled 2022-11-26: qty 1

## 2022-11-26 MED ORDER — FENTANYL CITRATE (PF) 250 MCG/5ML IJ SOLN
INTRAMUSCULAR | Status: DC | PRN
  Administered 2022-11-26 (×3): 50 ug via INTRAVENOUS
  Administered 2022-11-26: 100 ug via INTRAVENOUS

## 2022-11-26 MED ORDER — PROPOFOL 500 MG/50ML IV EMUL
INTRAVENOUS | Status: AC
  Filled 2022-11-26: qty 50

## 2022-11-26 MED ORDER — FENTANYL CITRATE PF 50 MCG/ML IJ SOSY
PREFILLED_SYRINGE | INTRAMUSCULAR | Status: AC
  Filled 2022-11-26: qty 1

## 2022-11-26 MED ORDER — SUGAMMADEX SODIUM 200 MG/2ML IV SOLN
INTRAVENOUS | Status: DC | PRN
  Administered 2022-11-26: 400 mg via INTRAVENOUS

## 2022-11-26 MED ORDER — CHLORHEXIDINE GLUCONATE 4 % EX SOLN: 60.0000 mL | Freq: Once | CUTANEOUS | Status: DC

## 2022-11-26 MED ORDER — CHLORHEXIDINE GLUCONATE 0.12 % MT SOLN
15.0000 mL | Freq: Once | OROMUCOSAL | Status: AC
  Administered 2022-11-26: 15 mL via OROMUCOSAL

## 2022-11-26 MED ORDER — ROCURONIUM BROMIDE 10 MG/ML (PF) SYRINGE
PREFILLED_SYRINGE | INTRAVENOUS | Status: AC
  Filled 2022-11-26: qty 10

## 2022-11-26 MED ORDER — ONDANSETRON HCL 4 MG/2ML IJ SOLN
INTRAMUSCULAR | Status: AC
  Filled 2022-11-26: qty 2

## 2022-11-26 MED ORDER — MIDAZOLAM HCL 2 MG/2ML IJ SOLN
INTRAMUSCULAR | Status: DC | PRN
  Administered 2022-11-26: 2 mg via INTRAVENOUS

## 2022-11-26 MED ORDER — ORAL CARE MOUTH RINSE: 15.0000 mL | Freq: Once | OROMUCOSAL | Status: AC

## 2022-11-26 MED ORDER — ACETAMINOPHEN 160 MG/5ML PO SOLN: 1000.0000 mg | Freq: Three times a day (TID) | ORAL | Status: DC

## 2022-11-26 MED ORDER — SCOPOLAMINE 1 MG/3DAYS TD PT72
1.0000 | MEDICATED_PATCH | TRANSDERMAL | Status: DC
  Administered 2022-11-26: 1.5 mg via TRANSDERMAL
  Filled 2022-11-26: qty 1

## 2022-11-26 MED ORDER — BUPIVACAINE HCL (PF) 0.25 % IJ SOLN
INTRAMUSCULAR | Status: AC
  Filled 2022-11-26: qty 30

## 2022-11-26 MED ORDER — HYDRALAZINE HCL 20 MG/ML IJ SOLN: 10.0000 mg | INTRAMUSCULAR | Status: DC | PRN

## 2022-11-26 MED ORDER — ACETAMINOPHEN 500 MG PO TABS
1000.0000 mg | ORAL_TABLET | ORAL | Status: AC
  Administered 2022-11-26: 1000 mg via ORAL
  Filled 2022-11-26: qty 2

## 2022-11-26 MED ORDER — INSULIN ASPART 100 UNIT/ML IJ SOLN
0.0000 [IU] | INTRAMUSCULAR | Status: DC
  Administered 2022-11-26 – 2022-11-27 (×2): 3 [IU] via SUBCUTANEOUS

## 2022-11-26 MED ORDER — LIDOCAINE HCL (PF) 2 % IJ SOLN
INTRAMUSCULAR | Status: AC
  Filled 2022-11-26: qty 5

## 2022-11-26 MED ORDER — LIDOCAINE HCL 2 % IJ SOLN
INTRAMUSCULAR | Status: AC
  Filled 2022-11-26: qty 20

## 2022-11-26 MED ORDER — HYDRALAZINE HCL 20 MG/ML IJ SOLN
5.0000 mg | Freq: Once | INTRAMUSCULAR | Status: AC
  Administered 2022-11-26: 5 mg via INTRAVENOUS

## 2022-11-26 MED ORDER — 0.9 % SODIUM CHLORIDE (POUR BTL) OPTIME
TOPICAL | Status: DC | PRN
  Administered 2022-11-26: 1000 mL

## 2022-11-26 MED ORDER — HEPARIN SODIUM (PORCINE) 5000 UNIT/ML IJ SOLN
5000.0000 [IU] | INTRAMUSCULAR | Status: AC
  Administered 2022-11-26: 5000 [IU] via SUBCUTANEOUS
  Filled 2022-11-26: qty 1

## 2022-11-26 MED ORDER — FENTANYL CITRATE PF 50 MCG/ML IJ SOSY
PREFILLED_SYRINGE | INTRAMUSCULAR | Status: AC
  Filled 2022-11-26: qty 2

## 2022-11-26 MED ORDER — LIDOCAINE 2% (20 MG/ML) 5 ML SYRINGE
INTRAMUSCULAR | Status: DC | PRN
  Administered 2022-11-26: 1.5 mg/kg/h via INTRAVENOUS
  Administered 2022-11-26: 100 mg via INTRAVENOUS

## 2022-11-26 MED ORDER — FENTANYL CITRATE (PF) 250 MCG/5ML IJ SOLN
INTRAMUSCULAR | Status: AC
  Filled 2022-11-26: qty 5

## 2022-11-26 MED ORDER — PANTOPRAZOLE SODIUM 40 MG IV SOLR
40.0000 mg | Freq: Every day | INTRAVENOUS | Status: DC
  Administered 2022-11-26: 40 mg via INTRAVENOUS
  Filled 2022-11-26: qty 10

## 2022-11-26 MED ORDER — SODIUM CHLORIDE 0.9 % IV SOLN: INTRAVENOUS | Status: DC

## 2022-11-26 MED ORDER — BUPIVACAINE LIPOSOME 1.3 % IJ SUSP: 20.0000 mL | Freq: Once | INTRAMUSCULAR | Status: DC

## 2022-11-26 MED ORDER — DOCUSATE SODIUM 100 MG PO CAPS
100.0000 mg | ORAL_CAPSULE | Freq: Two times a day (BID) | ORAL | Status: DC
  Administered 2022-11-26 – 2022-11-27 (×3): 100 mg via ORAL
  Filled 2022-11-26 (×3): qty 1

## 2022-11-26 MED ORDER — CLINDAMYCIN PHOSPHATE 900 MG/50ML IV SOLN
900.0000 mg | INTRAVENOUS | Status: AC
  Administered 2022-11-26: 900 mg via INTRAVENOUS
  Filled 2022-11-26: qty 50

## 2022-11-26 MED ORDER — ACETAMINOPHEN 325 MG PO TABS: 325.0000 mg | ORAL_TABLET | ORAL | Status: DC | PRN

## 2022-11-26 MED ORDER — ONDANSETRON HCL 4 MG/2ML IJ SOLN
INTRAMUSCULAR | Status: DC | PRN
  Administered 2022-11-26: 4 mg via INTRAVENOUS

## 2022-11-26 MED ORDER — ONDANSETRON HCL 4 MG/2ML IJ SOLN
4.0000 mg | INTRAMUSCULAR | Status: DC | PRN
  Administered 2022-11-27: 4 mg via INTRAVENOUS
  Filled 2022-11-26: qty 2

## 2022-11-26 MED ORDER — ENSURE MAX PROTEIN PO LIQD
2.0000 [oz_av] | ORAL | Status: DC
  Administered 2022-11-27 (×2): 2 [oz_av] via ORAL

## 2022-11-26 MED ORDER — APREPITANT 40 MG PO CAPS
40.0000 mg | ORAL_CAPSULE | ORAL | Status: AC
  Administered 2022-11-26: 40 mg via ORAL
  Filled 2022-11-26: qty 1

## 2022-11-26 MED ORDER — ACETAMINOPHEN 160 MG/5ML PO SOLN: 325.0000 mg | ORAL | Status: DC | PRN

## 2022-11-26 MED ORDER — TRAMADOL HCL 50 MG PO TABS
50.0000 mg | ORAL_TABLET | Freq: Four times a day (QID) | ORAL | Status: DC | PRN
  Administered 2022-11-26 – 2022-11-27 (×2): 50 mg via ORAL
  Filled 2022-11-26 (×2): qty 1

## 2022-11-26 MED ORDER — GENTAMICIN SULFATE 40 MG/ML IJ SOLN
5.0000 mg/kg | INTRAVENOUS | Status: AC
  Administered 2022-11-26: 530 mg via INTRAVENOUS
  Filled 2022-11-26: qty 13.25

## 2022-11-26 MED ORDER — OXYCODONE HCL 5 MG/5ML PO SOLN
5.0000 mg | Freq: Once | ORAL | Status: AC | PRN
  Administered 2022-11-26: 5 mg via ORAL

## 2022-11-26 MED ORDER — OXYCODONE HCL 5 MG/5ML PO SOLN
5.0000 mg | Freq: Four times a day (QID) | ORAL | Status: DC | PRN
  Administered 2022-11-26 – 2022-11-27 (×3): 5 mg via ORAL
  Filled 2022-11-26 (×3): qty 5

## 2022-11-26 MED ORDER — DEXAMETHASONE SODIUM PHOSPHATE 10 MG/ML IJ SOLN
INTRAMUSCULAR | Status: DC | PRN
  Administered 2022-11-26: 10 mg via INTRAVENOUS

## 2022-11-26 MED ORDER — OXYCODONE HCL 5 MG PO TABS: 5.0000 mg | ORAL_TABLET | Freq: Once | ORAL | Status: AC | PRN

## 2022-11-26 MED ORDER — METHOCARBAMOL 500 MG IVPB - SIMPLE MED
500.0000 mg | Freq: Four times a day (QID) | INTRAVENOUS | Status: DC | PRN
  Administered 2022-11-26 – 2022-11-27 (×3): 500 mg via INTRAVENOUS
  Filled 2022-11-26 (×3): qty 500

## 2022-11-26 MED ORDER — SCOPOLAMINE 1 MG/3DAYS TD PT72: 1.0000 | MEDICATED_PATCH | Freq: Once | TRANSDERMAL | Status: DC

## 2022-11-26 MED ORDER — FENTANYL CITRATE PF 50 MCG/ML IJ SOSY
25.0000 ug | PREFILLED_SYRINGE | INTRAMUSCULAR | Status: DC | PRN
  Administered 2022-11-26 (×3): 50 ug via INTRAVENOUS

## 2022-11-26 MED ORDER — KETAMINE HCL 10 MG/ML IJ SOLN: INTRAMUSCULAR | Status: DC | PRN

## 2022-11-26 MED ORDER — ENOXAPARIN SODIUM 30 MG/0.3ML IJ SOSY
30.0000 mg | PREFILLED_SYRINGE | Freq: Two times a day (BID) | INTRAMUSCULAR | Status: DC
  Administered 2022-11-26 – 2022-11-27 (×2): 30 mg via SUBCUTANEOUS
  Filled 2022-11-26 (×3): qty 0.3

## 2022-11-26 MED ORDER — BUPIVACAINE LIPOSOME 1.3 % IJ SUSP
INTRAMUSCULAR | Status: DC | PRN
  Administered 2022-11-26: 50 mL

## 2022-11-26 MED ORDER — OXYCODONE HCL 5 MG/5ML PO SOLN
ORAL | Status: AC
  Filled 2022-11-26: qty 5

## 2022-11-26 MED ORDER — ROCURONIUM BROMIDE 10 MG/ML (PF) SYRINGE
PREFILLED_SYRINGE | INTRAVENOUS | Status: DC | PRN
  Administered 2022-11-26: 50 mg via INTRAVENOUS
  Administered 2022-11-26: 20 mg via INTRAVENOUS
  Administered 2022-11-26: 10 mg via INTRAVENOUS

## 2022-11-26 MED ORDER — BUPIVACAINE LIPOSOME 1.3 % IJ SUSP
INTRAMUSCULAR | Status: AC
  Filled 2022-11-26: qty 20

## 2022-11-26 MED ORDER — LACTATED RINGERS IV SOLN: INTRAVENOUS | Status: DC

## 2022-11-26 MED ORDER — STERILE WATER FOR IRRIGATION IR SOLN
Status: DC | PRN
  Administered 2022-11-26: 500 mL
  Administered 2022-11-26: 1000 mL

## 2022-11-26 MED ORDER — METOCLOPRAMIDE HCL 5 MG/ML IJ SOLN
10.0000 mg | Freq: Four times a day (QID) | INTRAMUSCULAR | Status: DC
  Administered 2022-11-26 – 2022-11-27 (×3): 10 mg via INTRAVENOUS
  Filled 2022-11-26 (×3): qty 2

## 2022-11-26 MED ORDER — SIMETHICONE 80 MG PO CHEW
80.0000 mg | CHEWABLE_TABLET | Freq: Four times a day (QID) | ORAL | Status: DC | PRN
  Administered 2022-11-26 – 2022-11-27 (×4): 80 mg via ORAL
  Filled 2022-11-26 (×5): qty 1

## 2022-11-26 MED ORDER — HYDROMORPHONE HCL 1 MG/ML IJ SOLN
0.5000 mg | INTRAMUSCULAR | Status: DC | PRN
  Administered 2022-11-26: 0.5 mg via INTRAVENOUS

## 2022-11-26 MED ORDER — HYDROMORPHONE HCL 1 MG/ML IJ SOLN
INTRAMUSCULAR | Status: AC
  Filled 2022-11-26: qty 1

## 2022-11-26 MED ORDER — ACETAMINOPHEN 10 MG/ML IV SOLN
1000.0000 mg | Freq: Once | INTRAVENOUS | Status: DC | PRN
  Administered 2022-11-26: 1000 mg via INTRAVENOUS

## 2022-11-26 MED ORDER — DEXAMETHASONE SODIUM PHOSPHATE 10 MG/ML IJ SOLN
INTRAMUSCULAR | Status: AC
  Filled 2022-11-26: qty 1

## 2022-11-26 SURGICAL SUPPLY — 79 items
ANTIFOG SOL W/FOAM PAD STRL (MISCELLANEOUS) ×1
APL PRP STRL LF DISP 70% ISPRP (MISCELLANEOUS) ×2
APL SKNCLS STERI-STRIP NONHPOA (GAUZE/BANDAGES/DRESSINGS) ×1
APPLIER CLIP ROT 10 11.4 M/L (STAPLE)
APPLIER CLIP ROT 13.4 12 LRG (CLIP)
APR CLP LRG 13.4X12 ROT 20 MLT (CLIP)
APR CLP MED LRG 11.4X10 (STAPLE)
BAG COUNTER SPONGE SURGICOUNT (BAG) IMPLANT
BAG LAPAROSCOPIC 12 15 PORT 16 (BASKET) IMPLANT
BAG RETRIEVAL 12/15 (BASKET)
BAG SPNG CNTER NS LX DISP (BAG)
BENZOIN TINCTURE PRP APPL 2/3 (GAUZE/BANDAGES/DRESSINGS) ×1 IMPLANT
BLADE SURG SZ11 CARB STEEL (BLADE) ×1 IMPLANT
BNDG ADH 1X3 SHEER STRL LF (GAUZE/BANDAGES/DRESSINGS) ×6 IMPLANT
BNDG ADH THN 3X1 STRL LF (GAUZE/BANDAGES/DRESSINGS) ×6
CABLE HIGH FREQUENCY MONO STRZ (ELECTRODE) IMPLANT
CHLORAPREP W/TINT 26 (MISCELLANEOUS) ×2 IMPLANT
CLIP APPLIE ROT 10 11.4 M/L (STAPLE) IMPLANT
CLIP APPLIE ROT 13.4 12 LRG (CLIP) IMPLANT
COVER SURGICAL LIGHT HANDLE (MISCELLANEOUS) ×1 IMPLANT
DEVICE SUT QUICK LOAD TK 5 (SUTURE) IMPLANT
DEVICE SUT TI-KNOT TK 5X26 (SUTURE) IMPLANT
DRAPE UTILITY XL STRL (DRAPES) ×2 IMPLANT
ELECT REM PT RETURN 15FT ADLT (MISCELLANEOUS) ×1 IMPLANT
GAUZE SPONGE 4X4 12PLY STRL (GAUZE/BANDAGES/DRESSINGS) IMPLANT
GLOVE BIO SURGEON STRL SZ 6 (GLOVE) ×1 IMPLANT
GLOVE INDICATOR 6.5 STRL GRN (GLOVE) ×1 IMPLANT
GLOVE SS BIOGEL STRL SZ 6 (GLOVE) ×1 IMPLANT
GOWN STRL REUS W/ TWL LRG LVL3 (GOWN DISPOSABLE) ×1 IMPLANT
GOWN STRL REUS W/ TWL XL LVL3 (GOWN DISPOSABLE) IMPLANT
GOWN STRL REUS W/TWL LRG LVL3 (GOWN DISPOSABLE) ×1
GOWN STRL REUS W/TWL XL LVL3 (GOWN DISPOSABLE)
GRASPER SUT TROCAR 14GX15 (MISCELLANEOUS) ×1 IMPLANT
IRRIG SUCT STRYKERFLOW 2 WTIP (MISCELLANEOUS) ×1
IRRIGATION SUCT STRKRFLW 2 WTP (MISCELLANEOUS) ×1 IMPLANT
KIT BASIN OR (CUSTOM PROCEDURE TRAY) ×1 IMPLANT
KIT TURNOVER KIT A (KITS) IMPLANT
MARKER SKIN DUAL TIP RULER LAB (MISCELLANEOUS) ×1 IMPLANT
MAT PREVALON FULL STRYKER (MISCELLANEOUS) ×1 IMPLANT
NDL SPNL 22GX3.5 QUINCKE BK (NEEDLE) ×1 IMPLANT
NEEDLE SPNL 22GX3.5 QUINCKE BK (NEEDLE) ×1 IMPLANT
PACK UNIVERSAL I (CUSTOM PROCEDURE TRAY) ×1 IMPLANT
RELOAD ENDO STITCH (ENDOMECHANICALS) IMPLANT
RELOAD STAPLE 60 3.6 BLU REG (STAPLE) ×1 IMPLANT
RELOAD STAPLE 60 3.8 GOLD REG (STAPLE) ×1 IMPLANT
RELOAD STAPLE 60 4.1 GRN THCK (STAPLE) IMPLANT
RELOAD STAPLER BLUE 60MM (STAPLE) ×5 IMPLANT
RELOAD STAPLER GOLD 60MM (STAPLE) ×1 IMPLANT
RELOAD STAPLER GREEN 60MM (STAPLE) IMPLANT
RELOAD SUT TRIPLE-STITCH 2-0 (ENDOMECHANICALS) IMPLANT
SCISSORS LAP 5X45 EPIX DISP (ENDOMECHANICALS) ×1 IMPLANT
SET TUBE SMOKE EVAC HIGH FLOW (TUBING) ×1 IMPLANT
SHEARS HARMONIC ACE PLUS 45CM (MISCELLANEOUS) ×1 IMPLANT
SLEEVE ADV FIXATION 5X100MM (TROCAR) ×2 IMPLANT
SLEEVE GASTRECTOMY 40FR VISIGI (MISCELLANEOUS) ×1 IMPLANT
SOLUTION ANTFG W/FOAM PAD STRL (MISCELLANEOUS) ×1 IMPLANT
SPIKE FLUID TRANSFER (MISCELLANEOUS) ×1 IMPLANT
SPONGE T-LAP 18X18 ~~LOC~~+RFID (SPONGE) ×1 IMPLANT
STAPLE LINE REINFORCEMENT LAP (STAPLE) IMPLANT
STAPLER ECHELON LONG 3000 60 (ENDOMECHANICALS) IMPLANT
STAPLER ECHELON LONG 60 440 (INSTRUMENTS) ×1 IMPLANT
STAPLER RELOAD BLUE 60MM (STAPLE) ×5
STAPLER RELOAD GOLD 60MM (STAPLE) ×1
STAPLER RELOAD GREEN 60MM (STAPLE)
STRIP CLOSURE SKIN 1/2X4 (GAUZE/BANDAGES/DRESSINGS) ×1 IMPLANT
SUT MNCRL AB 4-0 PS2 18 (SUTURE) ×1 IMPLANT
SUT SURGIDAC NAB ES-9 0 48 120 (SUTURE) IMPLANT
SUT VICRYL 0 TIES 12 18 (SUTURE) ×1 IMPLANT
SYR 20ML ECCENTRIC (SYRINGE) ×1 IMPLANT
SYR 20ML LL LF (SYRINGE) ×1 IMPLANT
SYR 50ML LL SCALE MARK (SYRINGE) ×1 IMPLANT
SYS KII OPTICAL ACCESS 15MM (TROCAR) ×1
SYSTEM KII OPTICAL ACCESS 15MM (TROCAR) ×1 IMPLANT
TOWEL OR 17X26 10 PK STRL BLUE (TOWEL DISPOSABLE) ×1 IMPLANT
TOWEL OR NON WOVEN STRL DISP B (DISPOSABLE) ×1 IMPLANT
TROCAR ADV FIXATION 5X100MM (TROCAR) ×1 IMPLANT
TROCAR XCEL NON-BLD 5MMX100MML (ENDOMECHANICALS) ×1 IMPLANT
TUBING CONNECTING 10 (TUBING) ×1 IMPLANT
TUBING ENDO SMARTCAP (MISCELLANEOUS) ×1 IMPLANT

## 2022-11-26 NOTE — Interval H&P Note (Signed)
History and Physical Interval Note:  11/26/2022 6:58 AM  Tasha Hughes  has presented today for surgery, with the diagnosis of MORBID OBESITY.  The various methods of treatment have been discussed with the patient and family. After consideration of risks, benefits and other options for treatment, the patient has consented to  Procedure(s): LAPAROSCOPIC SLEEVE GASTRECTOMY (N/A) UPPER GI ENDOSCOPY (N/A) as a surgical intervention.  The patient's history has been reviewed, patient examined, no change in status, stable for surgery.  I have reviewed the patient's chart and labs.  Questions were answered to the patient's satisfaction.     [place] Luppino Lollie Sails

## 2022-11-26 NOTE — Progress Notes (Signed)
Discussed QI "Goals for Discharge" document with patient including ambulation in halls, Incentive Spirometry use every hour, and oral care.  Also discussed pain and nausea control.  Enabled or verified head of bed 30 degree alarm activated.  BSTOP education provided including BSTOP information guide, "Guide for Pain Management after your Bariatric Procedure".  Diet progression education provided including "Bariatric Surgery Post-Op Food Plan Phase 1: Liquids".  Questions answered.  Will continue to partner with bedside RN and follow up with patient per protocol.    Thank you,  Dani Wallner Markeith Jue, RN, MSN Bariatric Nurse Coordinator [phone] (office)  

## 2022-11-26 NOTE — Anesthesia Postprocedure Evaluation (Signed)
Anesthesia Post Note  Patient: Tasha Hughes  Procedure(s) Performed: LAPAROSCOPIC SLEEVE GASTRECTOMY UPPER GI ENDOSCOPY     Patient location during evaluation: PACU Anesthesia Type: General Level of consciousness: awake and alert Pain management: pain level controlled Vital Signs Assessment: post-procedure vital signs reviewed and stable Respiratory status: spontaneous breathing, nonlabored ventilation, respiratory function stable and patient connected to nasal cannula oxygen Cardiovascular status: blood pressure returned to baseline and stable Postop Assessment: no apparent nausea or vomiting Anesthetic complications: no  No notable events documented.  Last Vitals:  Vitals:   11/26/22 1341 11/26/22 1400  BP: (!) 155/98 (!) 161/96  Pulse: 94 86  Resp: 20 17  Temp:    SpO2: 99% 99%    Last Pain:  Vitals:   11/26/22 1400  TempSrc:   PainSc: 4                  Shelton Silvas

## 2022-11-26 NOTE — Anesthesia Procedure Notes (Signed)
Procedure Name: Intubation Date/Time: 11/26/2022 7:27 AM  Performed by: Florene Route, CRNAPre-anesthesia Checklist: Patient identified, Emergency Drugs available, Suction available and Patient being monitored Patient Re-evaluated:Patient Re-evaluated prior to induction Oxygen Delivery Method: Circle system utilized Preoxygenation: Pre-oxygenation with 100% oxygen Induction Type: IV induction Ventilation: Mask ventilation without difficulty and Oral airway inserted - appropriate to patient size Laryngoscope Size: Hyacinth Meeker and 2 Grade View: Grade I Tube type: Oral Tube size: 7.5 mm Number of attempts: 1 Airway Equipment and Method: Stylet and Oral airway Placement Confirmation: ETT inserted through vocal cords under direct vision, positive ETCO2 and breath sounds checked- equal and bilateral Secured at: 22 cm Tube secured with: Tape Dental Injury: Teeth and Oropharynx as per pre-operative assessment

## 2022-11-26 NOTE — Anesthesia Preprocedure Evaluation (Addendum)
Anesthesia Evaluation  Patient identified by MRN, date of birth, ID band Patient awake    Reviewed: Allergy & Precautions, NPO status , Patient's Chart, lab work & pertinent test results  Airway Mallampati: II  TM Distance: >3 FB Neck ROM: Full    Dental  (+) Teeth Intact, Dental Advisory Given   Pulmonary former smoker   breath sounds clear to auscultation       Cardiovascular  Rhythm:Regular Rate:Normal     Neuro/Psych negative neurological ROS  negative psych ROS   GI/Hepatic Neg liver ROS,GERD  ,,  Endo/Other  diabetes, Type 2, Oral Hypoglycemic Agents    Renal/GU negative Renal ROS     Musculoskeletal negative musculoskeletal ROS (+)    Abdominal  (+) + obese  Peds  Hematology negative hematology ROS (+)   Anesthesia Other Findings   Reproductive/Obstetrics                             Anesthesia Physical Anesthesia Plan  ASA: 4  Anesthesia Plan: General   Post-op Pain Management: Tylenol PO (pre-op)*   Induction: Intravenous  PONV Risk Score and Plan: 4 or greater and Ondansetron, Dexamethasone, Midazolam and Scopolamine patch - Pre-op  Airway Management Planned: Oral ETT  Additional Equipment: None  Intra-op Plan:   Post-operative Plan: Extubation in OR  Informed Consent: I have reviewed the patients History and Physical, chart, labs and discussed the procedure including the risks, benefits and alternatives for the proposed anesthesia with the patient or authorized representative who has indicated his/her understanding and acceptance.     Dental advisory given  Plan Discussed with: CRNA  Anesthesia Plan Comments:        Anesthesia Quick Evaluation

## 2022-11-26 NOTE — Transfer of Care (Signed)
Immediate Anesthesia Transfer of Care Note  Patient: Tasha Hughes  Procedure(s) Performed: LAPAROSCOPIC SLEEVE GASTRECTOMY UPPER GI ENDOSCOPY  Patient Location: PACU  Anesthesia Type:General  Level of Consciousness: awake, alert , and oriented  Airway & Oxygen Therapy: Patient Spontanous Breathing and Patient connected to face mask oxygen  Post-op Assessment: Report given to RN and Post -op Vital signs reviewed and stable  Post vital signs: Reviewed and stable  Last Vitals:  Vitals Value Taken Time  BP 144/105 11/26/22 0905  Temp    Pulse 92 11/26/22 0905  Resp 13 11/26/22 0905  SpO2 100 % 11/26/22 0905  Vitals shown include unvalidated device data.  Last Pain:  Vitals:   11/26/22 0557  TempSrc: Oral  PainSc: 0-No pain         Complications: No notable events documented.

## 2022-11-26 NOTE — Progress Notes (Signed)
Attempted report, nurse just went on her lunch break. Agreed upon bedside report in 30 minutes. Will continue to monitor patient.

## 2022-11-26 NOTE — Op Note (Signed)
Operative Note  Tasha Hughes  [id number]  [id number]  11/26/2022   Surgeon: Phylliss Blakes MD FACS   Assistant: Feliciana Rossetti MD FACS   Procedure performed: laparoscopic sleeve gastrectomy, upper endoscopy   Preop diagnosis: Morbid obesity Body mass index is 66.33 kg/m. Post-op diagnosis/intraop findings: same; hepatomegaly with hepatic steatosis   Specimens: fundus Retained items: none  EBL: minimal  Complications: none   Description of procedure: After obtaining informed consent and administration of chemical DVT prophylaxis in holding, the patient was taken to the operating room and placed supine on operating room table where general endotracheal anesthesia was initiated, preoperative antibiotics were administered, SCDs applied, and a formal timeout was performed. The abdomen was prepped and draped in usual sterile fashion. Peritoneal access was gained using a Visiport technique in the left upper quadrant and insufflation to 15 mmHg ensued without issue. Gross inspection revealed no evidence of injury. Under direct visualization three more 5 mm trochars were placed in the right and left hemiabdomen and the 15mm trocar in the right paramedian upper abdomen. Bilateral laparoscopic assisted TAPS blocks were performed with Exparel diluted with 0.25 percent Marcaine. The patient was placed in steep reverseTrendelenburg and the liver retractor was introduced through an incision in the upper midline and secured to the post externally to maintain the left lobe retracted anteriorly.  There is no hiatal hernia on direct inspection. Using the Harmonic scalpel, the greater curvature of the stomach was dissected away from the greater omentum and short gastric vessels were divided. This began 6 cm from the pylorus, and dissection proceeded until the left crus was clearly exposed. Esophageal fat pad was mobilized off the anterior stomach slightly. The 72 [place] VisiGi was then introduced and directed down  towards the pylorus. This was placed to suction against the lesser curve. Serial fires of the linear cutting stapler with staple line reinforcements were then employed to create our sleeve. The first fire used a gold load and ensured adequate room at the angularis incisura. Several blue loads were then employed to create an evenly tubular stomach preserving 1cm lateral to the angle of His. The excised stomach was then removed through our 15 mm trocar site within an Endo Catch bag.  The visigi was taken off of suction and a few puffs of air were introduced, inflating the sleeve. No bubbles were observed in the irrigation fluid around the stomach and the shape was noted to be evenly tubular without any narrowing at the angularis. The visigi was then removed. The intraabdominal pressure was reduced to . Upper endoscopy was performed by the assistant surgeon and the sleeve was noted to be airtight, the staple line was hemostatic. Please see his separate note. The endoscope was removed. The staple line was again inspected and noted to be hemostatic. The 15 mm trocar site fascia in the right upper abdomen was closed with a 0 Vicryl using the laparoscopic suture passer under direct visualization. The liver retractor was removed under direct visualization. The abdomen was then desufflated and all remaining trochars removed. The skin incisions were closed with subcuticular 4-0 Monocryl; benzoin, Steri-Strips and Band-Aids were applied The patient was then awakened, extubated and taken to PACU in stable condition.     All counts were correct at the completion of the case.

## 2022-11-27 ENCOUNTER — Encounter (HOSPITAL_COMMUNITY): Payer: Self-pay | Admitting: Surgery

## 2022-11-27 LAB — GLUCOSE, CAPILLARY
Glucose-Capillary: 114 mg/dL — ABNORMAL HIGH (ref 70–99)
Glucose-Capillary: 125 mg/dL — ABNORMAL HIGH (ref 70–99)

## 2022-11-27 LAB — COMPREHENSIVE METABOLIC PANEL
ALT: 64 U/L — ABNORMAL HIGH (ref 0–44)
AST: 36 U/L (ref 15–41)
Albumin: 3.7 g/dL (ref 3.5–5.0)
Alkaline Phosphatase: 61 U/L (ref 38–126)
Anion gap: 11 (ref 5–15)
BUN: 7 mg/dL (ref 6–20)
CO2: 25 mmol/L (ref 22–32)
Calcium: 8.9 mg/dL (ref 8.9–10.3)
Chloride: 103 mmol/L (ref 98–111)
Creatinine, Ser: 0.6 mg/dL (ref 0.44–1.00)
GFR, Estimated: >60 mL/min (ref >60)
Glucose, Bld: 120 mg/dL — ABNORMAL HIGH (ref 70–99)
Potassium: 3.6 mmol/L (ref 3.5–5.1)
Sodium: 139 mmol/L (ref 135–145)
Total Bilirubin: 0.4 mg/dL (ref 0.3–1.2)
Total Protein: 7.9 g/dL (ref 6.5–8.1)

## 2022-11-27 LAB — MAGNESIUM: Magnesium: 1.9 mg/dL (ref 1.7–2.4)

## 2022-11-27 LAB — CBC
HCT: 39.3 % (ref 36.0–46.0)
Hemoglobin: 12.3 g/dL (ref 12.0–15.0)
MCH: 24.6 pg — ABNORMAL LOW (ref 26.0–34.0)
MCHC: 31.3 g/dL (ref 30.0–36.0)
MCV: 78.8 fL — ABNORMAL LOW (ref 80.0–100.0)
Platelets: 346 10*3/uL (ref 150–400)
RBC: 4.99 MIL/uL (ref 3.87–5.11)
RDW: 15.5 % (ref 11.5–15.5)
WBC: 11.3 10*3/uL — ABNORMAL HIGH (ref 4.0–10.5)
nRBC: 0 % (ref 0.0–0.2)

## 2022-11-27 MED ORDER — ONDANSETRON 4 MG PO TBDP
4.0000 mg | ORAL_TABLET | Freq: Four times a day (QID) | ORAL | 0 refills | Status: DC | PRN
Start: 2022-11-27 — End: 2023-04-09

## 2022-11-27 MED ORDER — OXYCODONE HCL 5 MG PO TABS
5.0000 mg | ORAL_TABLET | Freq: Three times a day (TID) | ORAL | 0 refills | Status: DC | PRN
Start: 2022-11-27 — End: 2023-04-09

## 2022-11-27 MED ORDER — PANTOPRAZOLE SODIUM 40 MG PO TBEC
40.0000 mg | DELAYED_RELEASE_TABLET | Freq: Every day | ORAL | 0 refills | Status: DC
Start: 2022-11-27 — End: 2024-04-01

## 2022-11-27 MED ORDER — ACETAMINOPHEN 500 MG PO TABS: 1000.0000 mg | ORAL_TABLET | Freq: Three times a day (TID) | ORAL | 0 refills | Status: AC

## 2022-11-27 MED ORDER — APIXABAN 2.5 MG PO TABS: 2.5000 mg | ORAL_TABLET | Freq: Two times a day (BID) | ORAL | 0 refills | Status: DC

## 2022-11-27 MED ORDER — DOCUSATE SODIUM 100 MG PO CAPS: 100.0000 mg | ORAL_CAPSULE | Freq: Two times a day (BID) | ORAL | 0 refills | Status: AC

## 2022-11-27 NOTE — Progress Notes (Signed)
S: Up and down overnight but overall pain/nausea well controlled. Tolerating liquids. Notes some LUQ pain under her ribs that radiates to her back, controlled with meds. Walked several times.   O: Vitals, labs, intake/output, and orders reviewed at this time. Afebrile. Isolated HR 108 last night but otherwise no tachycardia. Hypertensive overnight, normotensive this morning. Sats 98% RA. PO 450; UOP 2900. CMP unremarkable; WBC 11.3 (8.1 preop); hgb 12.3 (12.1 preop). BG 114-125.   PRN meds since arriving on floor- tramadol x 2, simethicone x 4, oxycodone x 3, robaxin x 3, dilaudid x 1. Not on scheduled gabapentin due to listed adverse side effects; on scheduled tylenol.   Gen: A&Ox3, no distress  Chest: unlabored respirations, RRR Abd: soft, nontender, nondistended, incision(s) c/d/i with steris and bandaids; no cellulitis or hematoma Ext: warm, no edema Neuro: grossly normal  Lines/tubes/drains: PIV  A/P: POD 1 s/p lap sleeve -Continue liquids/protein shakes -Continue frequent ambulation, SCDs while in bed, lovenox ppx -Continue aggressive pulmonary toilet  Anticipate discharge later today if continues to progress well. DC on prophylactic eliquis  Phylliss Blakes, MD [facility], [place]

## 2022-11-27 NOTE — Progress Notes (Signed)
Patient alert and oriented, pain is controlled. Patient is tolerating fluids, advanced to protein shake today, patient is tolerating well. Reviewed Gastric sleeve/bypass discharge instructions with patient and patient is able to articulate understanding. Provided information on BELT program, Support Group, BSTOP-D, and [facility] outpatient pharmacy. Communicated general update of patient status to surgeon. All questions answered. 24hr fluid recall is 510 mL per hydration protocol, bariatric nurse coordinator to make follow-up phone call within one week.

## 2022-11-27 NOTE — Discharge Instructions (Signed)

## 2022-11-29 LAB — SURGICAL PATHOLOGY

## 2022-12-04 ENCOUNTER — Telehealth (HOSPITAL_COMMUNITY): Payer: Self-pay | Admitting: *Deleted

## 2022-12-04 NOTE — Telephone Encounter (Signed)
Unable to leave message as vm not set-up. Will attempt again.

## 2022-12-10 ENCOUNTER — Encounter: Payer: Self-pay | Admitting: Dietician

## 2022-12-10 ENCOUNTER — Encounter: Payer: 59 | Attending: Surgery | Admitting: Dietician

## 2022-12-10 ENCOUNTER — Telehealth (HOSPITAL_COMMUNITY): Payer: Self-pay | Admitting: *Deleted

## 2022-12-10 VITALS — Ht 65.0 in | Wt 376.5 lb

## 2022-12-10 DIAGNOSIS — E669 Obesity, unspecified: Secondary | ICD-10-CM | POA: Insufficient documentation

## 2022-12-10 NOTE — Progress Notes (Signed)
2 Week Post-Operative Nutrition Class   Patient was seen on 12/10/2022 for Post-Operative Nutrition education at the [facility].    Surgery date: 11/26/2022 Surgery type: Sleeve Gastrectomy   Anthropometrics  Start weight at [facility]: 400.0 lbs (date: 07/26/2022)  Height: 65 in Weight today: 376.5 lb   Clinical   Pharmacotherapy: History of weight loss medication used: [employer] Rx; supplements  Medical hx: obesity, PCOS, GERD Medications: metformin, Claritin   Labs: hemoglobin 11.8 Notable signs/symptoms: none noted Any previous deficiencies? No Bowel Habits: Every day to every other day no complaints   Body Composition Scale 12/10/2022  Current Body Weight 376.5  Total Body Fat % 52.1  Visceral Fat 20  Fat-Free Mass % 47.8   Total Body Water % 38.4  Muscle-Mass lbs 35.3  BMI 62.3  Body Fat Displacement          Torso  lbs 121.9         Left Leg  lbs 24.3         Right Leg  lbs 24.3         Left Arm  lbs 12.1         Right Arm  lbs 12.1      The following the learning objectives were met by the patient during this course: Identifies Soft Prepped Plan Advancement Guide  Identifies Soft, High Proteins (Phase 1), beginning 2 weeks post-operatively to 3 weeks post-operatively Identifies Additional Soft High Proteins, soft non-starchy vegetables, fruits and starches (Phase 2), beginning 3 weeks post-operatively to 3 months post-operatively Identifies appropriate sources of fluids, proteins, vegetables, fruits and starches Identifies appropriate fat sources and healthy verses unhealthy fat types   States protein, vegetable, fruit and starch recommendations and appropriate sources post-operatively Identifies the need for appropriate texture modifications, mastication, and bite sizes when consuming solids Identifies appropriate fat consumption and sources Identifies appropriate multivitamin and calcium sources post-operatively Describes  the need for physical activity post-operatively and will follow MD recommendations States when to call healthcare provider regarding medication questions or post-operative complications   Handouts given during class include: Soft Prepped Plan Advancement Guide   Follow-Up Plan: Patient will follow-up at [facility] in 10 weeks for 3 month post-op nutrition visit for diet advancement per MD.

## 2022-12-10 NOTE — Telephone Encounter (Signed)
Voicemail full unable to leave message - 2nd attempt

## 2022-12-19 ENCOUNTER — Telehealth: Payer: Self-pay | Admitting: Dietician

## 2022-12-19 NOTE — Telephone Encounter (Signed)
RD called pt to verify fluid intake once starting soft, solid proteins 2 week post-bariatric surgery.   Daily Fluid intake:  Daily Protein intake:  Bowel Habits:   Concerns/issues:   Could not leave voice message; mailbox is full and cannot accept messages at this time.

## 2023-01-23 ENCOUNTER — Telehealth: Payer: 59 | Admitting: Medical

## 2023-02-03 ENCOUNTER — Telehealth: Payer: Self-pay

## 2023-02-03 ENCOUNTER — Other Ambulatory Visit: Payer: Self-pay | Admitting: Medical

## 2023-02-03 MED ORDER — TIZANIDINE HCL 4 MG PO TABS: 4.0000 mg | ORAL_TABLET | Freq: Two times a day (BID) | ORAL | 0 refills | Status: DC | PRN

## 2023-02-03 NOTE — Telephone Encounter (Signed)
Pt called regarding a refill for tizanidine. States she has been out of it for a month and has been taking tylenol. States her back is stiff and can not get out of bed today. Still wants to come to hospital follow up on 02/07/23 but is requesting a refill now bc she will not make it until then. Last appt. 09/13/22

## 2023-02-03 NOTE — Telephone Encounter (Signed)
Unable to leave voicemail.

## 2023-02-07 ENCOUNTER — Encounter: Payer: Self-pay | Admitting: Medical

## 2023-02-07 ENCOUNTER — Ambulatory Visit (INDEPENDENT_AMBULATORY_CARE_PROVIDER_SITE_OTHER): Payer: 59 | Admitting: Medical

## 2023-02-07 VITALS — BP 128/70 | HR 78 | Temp 98.0°F | Wt 355.4 lb

## 2023-02-07 DIAGNOSIS — M545 Low back pain, unspecified: Secondary | ICD-10-CM

## 2023-02-07 DIAGNOSIS — M62838 Other muscle spasm: Secondary | ICD-10-CM

## 2023-02-07 DIAGNOSIS — R7303 Prediabetes: Secondary | ICD-10-CM | POA: Diagnosis not present

## 2023-02-07 DIAGNOSIS — D509 Iron deficiency anemia, unspecified: Secondary | ICD-10-CM | POA: Diagnosis not present

## 2023-02-07 DIAGNOSIS — Z903 Acquired absence of stomach [part of]: Secondary | ICD-10-CM

## 2023-02-07 DIAGNOSIS — K219 Gastro-esophageal reflux disease without esophagitis: Secondary | ICD-10-CM

## 2023-02-07 DIAGNOSIS — E282 Polycystic ovarian syndrome: Secondary | ICD-10-CM

## 2023-02-07 DIAGNOSIS — G8929 Other chronic pain: Secondary | ICD-10-CM

## 2023-02-07 NOTE — Progress Notes (Signed)
Subjective:  Tasha Hughes is a 32 y.o. female who presents for Chief Complaint  Patient presents with   Follow-up    Still getting used to eating   Back Pain    Lower back. Unable to move. History of back problems: pinched nerve. Getting a new mattress.      Here for some low back pain.  She called in earlier this week for muscle relaxer due to back pain, spasm.   No recent injury or trauma.  Has been active and exercising since her surgery, but back started hurting this week.  Typically does leg ups, dumbbell exercise.  Been walking.  She wonders if mattress is wore out.   Current mattress 31 years old.   She had gastric sleeve surgery in 11/2022.  Was 398lb the day of surgery.    Is released by surgery to do physical activity.    No leg pain.  If standing for a while cutting vegetables, may get some numbness in right leg.   No incontinence.  No fever, no belly pain.    Just recently started back using metformin per ob/gyn for PCOS  Takes iron daily. Periods were heavy but not bad lately  No recent problems with reflux.   Still taking pantoprazole  No other aggravating or relieving factors.    No other c/o.  Past Medical History:  Diagnosis Date   Allergy    Anemia    Chronic back pain    Former smoker    GERD (gastroesophageal reflux disease)    Leukocytosis    Obese    Polycystic ovarian disease    Pre-diabetes    Pulmonary emboli (HCC) 2021   Current Outpatient Medications on File Prior to Visit  Medication Sig Dispense Refill   EPINEPHrine 0.3 mg/0.3 mL IJ SOAJ injection Inject 0.3 mg into the muscle as needed for anaphylaxis. 1 each 1   ferrous sulfate 325 (65 FE) MG tablet Take 325 mg by mouth in the morning.     Multiple Vitamin (MULTIVITAMIN WITH MINERALS) TABS tablet Take 1 tablet by mouth in the morning. Women's One A Day VitaCraves Gummies     pantoprazole (PROTONIX) 40 MG tablet Take 1 tablet (40 mg total) by mouth daily. Take this medication daily  regardless of reflux symptoms 90 tablet 0   Probiotic Product (PROBIOTIC PO) Take 1 capsule by mouth in the morning and at bedtime. Love Wellness Good Girl Vaginal Probiotics     tiZANidine (ZANAFLEX) 4 MG tablet Take 1 tablet (4 mg total) by mouth 2 (two) times daily as needed for muscle spasms. 30 tablet 0   tiZANidine (ZANAFLEX) 4 MG tablet Take 1 tablet (4 mg total) by mouth 2 (two) times daily as needed for muscle spasms. 45 tablet 0   apixaban (ELIQUIS) 2.5 MG TABS tablet Take 1 tablet (2.5 mg total) by mouth 2 (two) times daily. 60 tablet 0   COLLAGEN-VITAMIN C-BIOTIN PO Take 1 drop by mouth in the morning. 1 dropperful (Patient not taking: Reported on 02/07/2023)     ondansetron (ZOFRAN-ODT) 4 MG disintegrating tablet Take 1 tablet (4 mg total) by mouth every 6 (six) hours as needed for nausea or vomiting. 20 tablet 0   oxyCODONE (OXY IR/ROXICODONE) 5 MG immediate release tablet Take 1 tablet (5 mg total) by mouth every 8 (eight) hours as needed for severe pain. 10 tablet 0   [DISCONTINUED] diphenhydrAMINE (BENADRYL) 25 mg capsule Take 1 capsule (25 mg total) by mouth every 6 (six) hours  as needed for itching. (Patient not taking: Reported on 04/07/2020) 20 capsule 0   No current facility-administered medications on file prior to visit.   The following portions of the patient's history were reviewed and updated as appropriate: allergies, current medications, past family history, past medical history, past social history, past surgical history and problem list.  ROS Otherwise as in subjective above    Objective: BP 128/70   Pulse 78   Temp 98 F (36.7 C)   Wt (!) 355 lb 6.4 oz (161.2 kg)   LMP 02/05/2023   SpO2 98%   BMI 59.14 kg/m   Wt Readings from Last 3 Encounters:  02/07/23 (!) 355 lb 6.4 oz (161.2 kg)  12/10/22 (!) 376 lb 8 oz (170.8 kg)  11/26/22 (!) 398 lb 9.6 oz (180.8 kg)    General appearance: alert, no distress, well developed, well nourished Neck: supple, no  lymphadenopathy, no thyromegaly, no masses Heart: RRR, normal S1, S2, no murmurs Lungs: CTA bilaterally, no wheezes, rhonchi, or rales Back: Mild tenderness in the right lumbar spine, range of motion is full, no obvious pain with range of motion today, no swelling or deformity Legs nontender with normal range of motion of hips Legs neurovascularly intact, normal heel and toe walk, negative straight leg raise Pulses: 2+ radial pulses, 2+ pedal pulses, normal cap refill Ext: no edema   Assessment: Encounter Diagnoses  Name Primary?   Prediabetes Yes   S/P gastric sleeve procedure    Iron deficiency anemia, unspecified iron deficiency anemia type    Muscle spasm    Chronic bilateral low back pain without sciatica    PCOS (polycystic ovarian syndrome)    Morbid obesity (HCC)    Gastroesophageal reflux disease, unspecified whether esophagitis present      Plan: Status post gastric sleeve procedure in July 2024.  She lost about 50 pounds so far.  Continue efforts to lose weight through healthy diet and exercise.  Counseled on aerobic and weightbearing exercise.  She is on metformin per gynecology for PCOS  She has been prediabetic prior.  Updated labs today for hemoglobin A1c  She has been iron deficient anemic.  Currently taking iron daily.  Recheck iron today.  She sees hematology next month and she plans to have labs there including blood count electrolytes liver and kidney at that time so I will defer these today.  Back pain, muscle spasm-I reviewed the CT abdomen pelvis she had done this year 2024 and there was a comment about some degenerative changes of the lumbar spine.  Advised stretching, can still use muscle laxer as needed, Tylenol as needed.  Consider physical therapy if symptoms worsen.  She seems improved compared to earlier in the week.  Consider baseline lumbar x-ray if symptoms continue or worsen.  GERD-currently on Protonix but we discussed possibly coming off of  that in the near future since her weight is improving  Ji was seen today for follow-up and back pain.  Diagnoses and all orders for this visit:  Prediabetes -     Hemoglobin A1c  S/P gastric sleeve procedure  Iron deficiency anemia, unspecified iron deficiency anemia type -     Iron, TIBC and Ferritin Panel  Muscle spasm  Chronic bilateral low back pain without sciatica  PCOS (polycystic ovarian syndrome)  Morbid obesity (HCC)  Gastroesophageal reflux disease, unspecified whether esophagitis present    Follow up: Pending labs

## 2023-02-08 LAB — HEMOGLOBIN A1C
Est. average glucose Bld gHb Est-mCnc: 128 mg/dL
Hgb A1c MFr Bld: 6.1 % — ABNORMAL HIGH (ref 4.8–5.6)

## 2023-02-08 LAB — IRON,TIBC AND FERRITIN PANEL
Ferritin: 274 ng/mL — ABNORMAL HIGH (ref 15–150)
Iron Saturation: 16 % (ref 15–55)
Iron: 38 ug/dL (ref 27–159)
Total Iron Binding Capacity: 232 ug/dL — ABNORMAL LOW (ref 250–450)
UIBC: 194 ug/dL (ref 131–425)

## 2023-02-10 NOTE — Progress Notes (Signed)
Results sent through MyChart

## 2023-02-24 ENCOUNTER — Other Ambulatory Visit: Payer: Self-pay | Admitting: Medical

## 2023-02-28 ENCOUNTER — Encounter: Payer: 59 | Admitting: Medical

## 2023-03-02 ENCOUNTER — Other Ambulatory Visit: Payer: Self-pay | Admitting: Medical

## 2023-03-03 NOTE — Telephone Encounter (Signed)
This was discontinued over a year ago

## 2023-03-12 ENCOUNTER — Other Ambulatory Visit: Payer: Self-pay

## 2023-03-12 ENCOUNTER — Other Ambulatory Visit: Payer: Self-pay | Admitting: Hematology

## 2023-03-12 DIAGNOSIS — Z86711 Personal history of pulmonary embolism: Secondary | ICD-10-CM

## 2023-03-13 ENCOUNTER — Inpatient Hospital Stay: Payer: 59

## 2023-03-14 ENCOUNTER — Inpatient Hospital Stay: Payer: 59 | Attending: Hematology

## 2023-03-14 DIAGNOSIS — Z9089 Acquired absence of other organs: Secondary | ICD-10-CM | POA: Insufficient documentation

## 2023-03-14 DIAGNOSIS — Z880 Allergy status to penicillin: Secondary | ICD-10-CM | POA: Insufficient documentation

## 2023-03-14 DIAGNOSIS — G8918 Other acute postprocedural pain: Secondary | ICD-10-CM | POA: Diagnosis not present

## 2023-03-14 DIAGNOSIS — Z888 Allergy status to other drugs, medicaments and biological substances status: Secondary | ICD-10-CM | POA: Insufficient documentation

## 2023-03-14 DIAGNOSIS — E282 Polycystic ovarian syndrome: Secondary | ICD-10-CM | POA: Insufficient documentation

## 2023-03-14 DIAGNOSIS — Z7901 Long term (current) use of anticoagulants: Secondary | ICD-10-CM | POA: Diagnosis not present

## 2023-03-14 DIAGNOSIS — F17210 Nicotine dependence, cigarettes, uncomplicated: Secondary | ICD-10-CM | POA: Insufficient documentation

## 2023-03-14 DIAGNOSIS — G8929 Other chronic pain: Secondary | ICD-10-CM | POA: Diagnosis not present

## 2023-03-14 DIAGNOSIS — R634 Abnormal weight loss: Secondary | ICD-10-CM | POA: Diagnosis not present

## 2023-03-14 DIAGNOSIS — Z86711 Personal history of pulmonary embolism: Secondary | ICD-10-CM | POA: Diagnosis present

## 2023-03-14 DIAGNOSIS — Z79899 Other long term (current) drug therapy: Secondary | ICD-10-CM | POA: Diagnosis not present

## 2023-03-16 LAB — CARDIOLIPIN ANTIBODIES, IGG, IGM, IGA
Anticardiolipin IgA: <9 [APL'U]/mL (ref 0–11)
Anticardiolipin IgG: <9 [GPL'U]/mL (ref 0–14)
Anticardiolipin IgM: <9 [MPL'U]/mL (ref 0–12)

## 2023-03-18 ENCOUNTER — Telehealth: Payer: Self-pay | Admitting: Hematology

## 2023-03-19 ENCOUNTER — Other Ambulatory Visit: Payer: 59

## 2023-03-19 DIAGNOSIS — Z111 Encounter for screening for respiratory tuberculosis: Secondary | ICD-10-CM

## 2023-03-20 ENCOUNTER — Inpatient Hospital Stay: Payer: 59 | Admitting: Hematology

## 2023-03-21 ENCOUNTER — Ambulatory Visit: Payer: 59 | Admitting: Dietician

## 2023-03-21 ENCOUNTER — Encounter: Payer: Self-pay | Admitting: Hematology

## 2023-03-21 ENCOUNTER — Inpatient Hospital Stay (HOSPITAL_BASED_OUTPATIENT_CLINIC_OR_DEPARTMENT_OTHER): Payer: 59 | Admitting: Hematology

## 2023-03-21 DIAGNOSIS — Z86711 Personal history of pulmonary embolism: Secondary | ICD-10-CM | POA: Diagnosis not present

## 2023-03-21 NOTE — Progress Notes (Signed)
[facility]   [phone] [phone]   Clinic Follow up Note   Patient Care Team: Tysinger, Kermit Balo, PA-C as PCP - General (Family Medicine) Rollene Rotunda, MD as PCP - Cardiology (Cardiology) Malachy Mood, MD as Consulting Physician (Hematology and Oncology) 03/21/2023  I connected with [name] on 03/21/23 at  1:00 PM EDT by telephone and verified that I am speaking with the correct person using two identifiers.   I discussed the limitations, risks, security and privacy concerns of performing an evaluation and management service by telephone and the availability of in person appointments. I also discussed with the patient that there may be a patient responsible charge related to this service. The patient expressed understanding and agreed to proceed.   Patient's location: Home Provider's location:  Office    CHIEF COMPLAINT: Follow-up and episodes  Assessment and Plan    Personal history of pulmonary embolism She had a 1 episode of PE in November 2021.  Probably provoked by COVID-vaccine and oral contraceptives.  She also has other risk factors including morbid obesity and heavy smoking. -She was treated with 3 months of Eliquis, and has been off since then. -I discussed her hypercoagulopathy workup, which was basically negative except positive cardiolipin antibody IgM, with low titer 23.   -Repeated cardiolipin IgM was normal -No evidence of antiphospholipid syndrome, I do not recommend long-term anticoagulation.  Plan -Lab results reviewed -I will see her as needed   Discussed the use of AI scribe software for clinical note transcription with the patient, who gave verbal consent to proceed.  History of Present Illness   The patient, with a history of PE, underwent gastric sleeve surgery in July. She reports a successful recovery with only a few days of post-operative pain. She has noticed weight loss since the surgery. She denies any  known complications such as blood clots, but expresses concern about occasional shoulder tightness. She plans to discuss this symptom with her primary care provider.  The patient also mentions a history of elevated cardiolipin antibody IgM levels, which were normal in recent lab work. She expresses relief at not having antiphospholipid syndrome and not needing to take blood thinners. She understands that future surgeries may require blood thinners depending on the procedure.       REVIEW OF SYSTEMS:   Constitutional: Denies fevers, chills or abnormal weight loss Eyes: Denies blurriness of vision Ears, nose, mouth, throat, and face: Denies mucositis or sore throat Respiratory: Denies cough, dyspnea or wheezes Cardiovascular: Denies palpitation, chest discomfort or lower extremity swelling Gastrointestinal:  Denies nausea, heartburn or change in bowel habits Skin: Denies abnormal skin rashes Lymphatics: Denies new lymphadenopathy or easy bruising Neurological:Denies numbness, tingling or new weaknesses Behavioral/Psych: Mood is stable, no new changes  All other systems were reviewed with the patient and are negative.  MEDICAL HISTORY:  Past Medical History:  Diagnosis Date   Allergy    Anemia    Chronic back pain    Former smoker    GERD (gastroesophageal reflux disease)    Leukocytosis    Obese    Polycystic ovarian disease    Pre-diabetes    Pulmonary emboli (HCC) 2021    SURGICAL HISTORY: Past Surgical History:  Procedure Laterality Date   LAPAROSCOPIC GASTRIC SLEEVE RESECTION N/A 11/26/2022   Procedure: LAPAROSCOPIC SLEEVE GASTRECTOMY;  Surgeon: Berna Bue, MD;  Location: [facility];  Service: General;  Laterality: N/A;   NO PAST SURGERIES     TONSILLECTOMY  UPPER GI ENDOSCOPY N/A 11/26/2022   Procedure: UPPER GI ENDOSCOPY;  Surgeon: Berna Bue, MD;  Location: [facility];  Service: General;  Laterality: N/A;   WISDOM TOOTH EXTRACTION  2023    I have reviewed the  social history and family history with the patient and they are unchanged from previous note.  ALLERGIES:  is allergic to bee venom, other, penicillins, robitussin [guaifenesin], shrimp [shellfish allergy], soybean-containing drug products, sudafed [pseudoephedrine hcl], gabapentin, and latex.  MEDICATIONS:  Current Outpatient Medications  Medication Sig Dispense Refill   apixaban (ELIQUIS) 2.5 MG TABS tablet Take 1 tablet (2.5 mg total) by mouth 2 (two) times daily. 60 tablet 0   COLLAGEN-VITAMIN C-BIOTIN PO Take 1 drop by mouth in the morning. 1 dropperful (Patient not taking: Reported on 02/07/2023)     EPINEPHrine 0.3 mg/0.3 mL IJ SOAJ injection Inject 0.3 mg into the muscle as needed for anaphylaxis. 1 each 1   ferrous sulfate 325 (65 FE) MG tablet Take 325 mg by mouth in the morning.     Multiple Vitamin (MULTIVITAMIN WITH MINERALS) TABS tablet Take 1 tablet by mouth in the morning. Women's One A Day VitaCraves Gummies     ondansetron (ZOFRAN-ODT) 4 MG disintegrating tablet Take 1 tablet (4 mg total) by mouth every 6 (six) hours as needed for nausea or vomiting. 20 tablet 0   oxyCODONE (OXY IR/ROXICODONE) 5 MG immediate release tablet Take 1 tablet (5 mg total) by mouth every 8 (eight) hours as needed for severe pain. 10 tablet 0   pantoprazole (PROTONIX) 40 MG tablet Take 1 tablet (40 mg total) by mouth daily. Take this medication daily regardless of reflux symptoms 90 tablet 0   Probiotic Product (PROBIOTIC PO) Take 1 capsule by mouth in the morning and at bedtime. Love Wellness Good Girl Vaginal Probiotics     tiZANidine (ZANAFLEX) 4 MG tablet Take 1 tablet (4 mg total) by mouth 2 (two) times daily as needed for muscle spasms. 30 tablet 0   tiZANidine (ZANAFLEX) 4 MG tablet TAKE 1 TABLET(4 MG) BY MOUTH TWICE DAILY AS NEEDED FOR MUSCLE SPASMS 45 tablet 0   No current facility-administered medications for this visit.    PHYSICAL EXAMINATION: Not performed   LABORATORY DATA:  I have  reviewed the data as listed    Latest Ref Rng & Units 11/27/2022    5:06 AM 11/15/2022    8:21 AM 07/07/2022    9:00 PM  CBC  WBC 4.0 - 10.5 K/uL 11.3  8.1  10.7   Hemoglobin 12.0 - 15.0 g/dL 16.1  09.6  04.5   Hematocrit 36.0 - 46.0 % 39.3  40.2  37.4   Platelets 150 - 400 K/uL 346  347  353         Latest Ref Rng & Units 11/27/2022    5:06 AM 11/15/2022    8:21 AM 07/07/2022    9:00 PM  CMP  Glucose 70 - 99 mg/dL 409  811  914   BUN 6 - 20 mg/dL 7  17  10    Creatinine 0.44 - 1.00 mg/dL 7.82  9.56  2.13   Sodium 135 - 145 mmol/L 139  136  136   Potassium 3.5 - 5.1 mmol/L 3.6  3.9  2.9   Chloride 98 - 111 mmol/L 103  101  98   CO2 22 - 32 mmol/L 25  26  26    Calcium 8.9 - 10.3 mg/dL 8.9  9.0  8.4   Total  Protein 6.5 - 8.1 g/dL 7.9  7.7  8.0   Total Bilirubin 0.3 - 1.2 mg/dL 0.4  0.3  0.3   Alkaline Phos 38 - 126 U/L 61  67  93   AST 15 - 41 U/L 36  15  40   ALT 0 - 44 U/L 64  26  56       RADIOGRAPHIC STUDIES: I have personally reviewed the radiological images as listed and agreed with the findings in the report. No results found.     I discussed the assessment and treatment plan with the patient. The patient was provided an opportunity to ask questions and all were answered. The patient agreed with the plan and demonstrated an understanding of the instructions.   The patient was advised to call back or seek an in-person evaluation if the symptoms worsen or if the condition fails to improve as anticipated.  I provided 8 minutes of non face-to-face telephone visit time during this encounter, and > 50% was spent counseling as documented under my assessment & plan.     Malachy Mood, MD 03/21/23

## 2023-03-22 LAB — QUANTIFERON-TB GOLD PLUS
QuantiFERON Mitogen Value: >10 [IU]/mL
QuantiFERON Nil Value: 0.02 [IU]/mL
QuantiFERON TB1 Ag Value: 0.01 [IU]/mL
QuantiFERON TB2 Ag Value: 0.02 [IU]/mL
QuantiFERON-TB Gold Plus: NEGATIVE

## 2023-04-09 ENCOUNTER — Ambulatory Visit (INDEPENDENT_AMBULATORY_CARE_PROVIDER_SITE_OTHER): Payer: 59 | Admitting: Family Medicine

## 2023-04-09 ENCOUNTER — Encounter: Payer: Self-pay | Admitting: Family Medicine

## 2023-04-09 VITALS — BP 128/70 | HR 80 | Temp 98.2°F | Ht 65.0 in | Wt 344.0 lb

## 2023-04-09 DIAGNOSIS — K119 Disease of salivary gland, unspecified: Secondary | ICD-10-CM | POA: Diagnosis not present

## 2023-04-09 NOTE — Patient Instructions (Addendum)
I suspect that you have a possible early blockage of the salivary gland (potentially due to a stone). Staying well hydrated and keeping the saliva flowing can help unblock it and feel better.  I recommend getting lemon drop hard candies to suck on throughout the day. Try moist heat and massage to see if this can help improve the swelling and pain.  You may use tylenol, and/or ibuprofen or naproxen as needed for pain.  If you develop increase in pain, persistent swelling or fever, this could indicate an infection (see handout attached). I do NOT think you currently have an infection.   The tylenol sinus severe that you are taking contains guaifenesin--which is listed as an allergy.  Maybe it was some other ingredient in the robitussin that you had a reaction to. Robitussin also contains guaifenesin.

## 2023-04-09 NOTE — Progress Notes (Signed)
Chief Complaint  Patient presents with   Swollen Gland    Swollen gland left side under tongue. She went to take a bite of food this morning and there was a weird feeling, like when you eat something sour. Somewhat uncomfortable and then began to get really swollen.    This morning, when she took her first bite of cereal, she noticed a discomfort that started at the angle of L jaw, then spread to the underside of the left tongue.  Sharp, "crazy" feeling pain.  The swelling on the L lower jaw has persisted, but the pain comes/goes, with opening/closing mouth, and with eating. When she eats, it swells under the left side of her tongue, and it is very painful.  She recalls this happening once before, went to [facility] ER, diagnosed with sinus infection.  She reports it was the exact same feeling as she has now. (06/2022).  Then she was also having fever.   She has a routine dental visit on Friday.   PMH, PSH, SH reviewed  S/p bariatric surgery, PE, PCOS, anemia/elevated WBC, pre-DM She denies any autoimmune history. Recent labs reviewed  Outpatient Encounter Medications as of 04/09/2023  Medication Sig Note   Multiple Vitamin (MULTIVITAMIN ADULT PO) Take 1 tablet by mouth daily. 04/09/2023: Bariatric multivitamin, [employer]   pantoprazole (PROTONIX) 40 MG tablet Take 1 tablet (40 mg total) by mouth daily. Take this medication daily regardless of reflux symptoms    Phenylephrine-APAP-guaiFENesin (TYLENOL SINUS SEVERE PO) Take 1 tablet by mouth. 04/09/2023: Taking it prn, currently BID   COLLAGEN-VITAMIN C-BIOTIN PO Take 1 drop by mouth in the morning. 1 dropperful (Patient not taking: Reported on 02/07/2023)    EPINEPHrine 0.3 mg/0.3 mL IJ SOAJ injection Inject 0.3 mg into the muscle as needed for anaphylaxis. (Patient not taking: Reported on 04/09/2023)    ferrous sulfate 325 (65 FE) MG tablet Take 325 mg by mouth in the morning. (Patient not taking: Reported on 04/09/2023)    Probiotic Product  (PROBIOTIC PO) Take 1 capsule by mouth in the morning and at bedtime. Love Wellness Good Girl Vaginal Probiotics (Patient not taking: Reported on 04/09/2023)    tiZANidine (ZANAFLEX) 4 MG tablet TAKE 1 TABLET(4 MG) BY MOUTH TWICE DAILY AS NEEDED FOR MUSCLE SPASMS (Patient not taking: Reported on 04/09/2023) 04/09/2023: As needed   [DISCONTINUED] apixaban (ELIQUIS) 2.5 MG TABS tablet Take 1 tablet (2.5 mg total) by mouth 2 (two) times daily.    [DISCONTINUED] diphenhydrAMINE (BENADRYL) 25 mg capsule Take 1 capsule (25 mg total) by mouth every 6 (six) hours as needed for itching. (Patient not taking: Reported on 04/07/2020)    [DISCONTINUED] Multiple Vitamin (MULTIVITAMIN WITH MINERALS) TABS tablet Take 1 tablet by mouth in the morning. Women's One A Day VitaCraves Gummies (Patient not taking: Reported on 04/09/2023)    [DISCONTINUED] ondansetron (ZOFRAN-ODT) 4 MG disintegrating tablet Take 1 tablet (4 mg total) by mouth every 6 (six) hours as needed for nausea or vomiting.    [DISCONTINUED] oxyCODONE (OXY IR/ROXICODONE) 5 MG immediate release tablet Take 1 tablet (5 mg total) by mouth every 8 (eight) hours as needed for severe pain.    [DISCONTINUED] tiZANidine (ZANAFLEX) 4 MG tablet Take 1 tablet (4 mg total) by mouth 2 (two) times daily as needed for muscle spasms.    No facility-administered encounter medications on file as of 04/09/2023.   Allergies  Allergen Reactions   Bee Venom Hives and Swelling   Other Hives    Cat dander  Penicillins Anaphylaxis and Hives    Has patient had a PCN reaction causing immediate rash, facial/tongue/throat swelling, SOB or lightheadedness with hypotension: Yes Has patient had a PCN reaction causing severe rash involving mucus membranes or skin necrosis: No Has patient had a PCN reaction that required hospitalization Yes Has patient had a PCN reaction occurring within the last 10 years: No If all of the above answers are "NO", then may proceed with  Cephalosporin use.    Robitussin [Guaifenesin] Hives and Itching   Shrimp [Shellfish Allergy] Anaphylaxis, Hives, Itching and Swelling   Soybean-Containing Drug Products Anaphylaxis   Sudafed [Pseudoephedrine Hcl] Hives   Gabapentin     headaches   Latex Hives and Swelling    ROS: no f/c. She had some mild congestion yesterday. Denies cough. No sinus pain. Mild nausea and body aches yesterday. No other complaints.  See HPI   PHYSICAL EXAM:  BP 128/70   Pulse 80   Temp 98.2 F (36.8 C) (Tympanic)   Ht 5\' 5"  (1.651 m)   Wt (!) 344 lb (156 kg)   LMP 04/05/2023 Comment: has been irregular since bariatric surgery in July  BMI 57.24 kg/m   Well-appearing female in no distress HEENT: conjunctiva and sclera are clear, EOMI. OP is clear. Under tongue is normal, no palpable mass, nontender. 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 Discussed hydration, lemon-drops, heat and massage. Reviewed s/sx infection, contact us if develops  This is 2nd episode. Consider work-up for autoimmune disease.  If worsening pain, fever, will need ABX to cover for infection. No evidence of any infection today. If persistent swelling, without evidence of infection, may need imaging or referral to ENT.  Patient asking for ABX pre-emptively. Discussed why this is not done, risks. Discussed anatomy, explained what is likely going on, that relief will not be immediate.  All questions answered to best of my ability.   I spent 35 minutes dedicated to the care of this patient, including pre-visit review of records, face to face time, post-visit ordering of testing and documentation.

## 2023-04-16 ENCOUNTER — Emergency Department (HOSPITAL_COMMUNITY)
Admission: EM | Admit: 2023-04-16 | Discharge: 2023-04-16 | Disposition: A | Discharge status: Home / Self Care | Payer: 59 | Source: Home / Self Care | Attending: Emergency Medicine | Admitting: Emergency Medicine

## 2023-04-16 ENCOUNTER — Encounter (HOSPITAL_COMMUNITY): Payer: Self-pay

## 2023-04-16 ENCOUNTER — Other Ambulatory Visit: Payer: Self-pay

## 2023-04-16 ENCOUNTER — Emergency Department (HOSPITAL_COMMUNITY)
Admission: EM | Admit: 2023-04-16 | Discharge: 2023-04-16 | Discharge status: Against Medical Advice | Payer: 59 | Attending: Emergency Medicine | Admitting: Emergency Medicine

## 2023-04-16 DIAGNOSIS — Z5321 Procedure and treatment not carried out due to patient leaving prior to being seen by health care provider: Secondary | ICD-10-CM | POA: Insufficient documentation

## 2023-04-16 DIAGNOSIS — E162 Hypoglycemia, unspecified: Secondary | ICD-10-CM | POA: Insufficient documentation

## 2023-04-16 DIAGNOSIS — Z87891 Personal history of nicotine dependence: Secondary | ICD-10-CM | POA: Insufficient documentation

## 2023-04-16 LAB — CBG MONITORING, ED
Glucose-Capillary: 116 mg/dL — ABNORMAL HIGH (ref 70–99)
Glucose-Capillary: 101 mg/dL — ABNORMAL HIGH (ref 70–99)
Glucose-Capillary: 103 mg/dL — ABNORMAL HIGH (ref 70–99)

## 2023-04-16 LAB — POC URINE PREG, ED: Preg Test, Ur: NEGATIVE

## 2023-04-16 NOTE — ED Notes (Signed)
Pt stated that her sugar is low, Pt was informed that we can check her sugar but we need approval to give her food or drinks base on her sugar. Pt then said her sugar is dangerously low according to her monitor. Monitor read a CBG of 3. Pt was informed that we can not give food or drinks while in the lobby. Pt then left stating that "this hospital doesn't care about people". Pt seen leaving the ED.

## 2023-04-16 NOTE — Discharge Instructions (Signed)
You were evaluated in the Emergency Department and after careful evaluation, we did not find any emergent condition requiring admission or further testing in the hospital.  Your exam/testing today was overall reassuring.  Please return to the Emergency Department if you experience any worsening of your condition.  Thank you for allowing us to be a part of your care.  

## 2023-04-16 NOTE — ED Provider Notes (Signed)
[facility] Emergency Department Provider Note MRN:  [id number]  Arrival date & time: 04/16/23     Chief Complaint   Hypoglycemia   History of Present Illness   Tasha Hughes is a 32 y.o. year-old female with a history of prediabetes, gastric sleeve presenting to the ED with chief complaint of hypoglycemia.  Frequent alerts on her new glucose monitor that her blood sugars in the 60s.  Largely asymptomatic during these readings.  Having to snack often in response to these readings which is problematic given her recent gastric sleeve.  Review of Systems  A thorough review of systems was obtained and all systems are negative except as noted in the HPI and PMH.   Patient's Health History    Past Medical History:  Diagnosis Date   Allergy    Anemia    Chronic back pain    Former smoker    GERD (gastroesophageal reflux disease)    Leukocytosis    Obese    Polycystic ovarian disease    Pre-diabetes    Pulmonary emboli (HCC) 2021    Past Surgical History:  Procedure Laterality Date   LAPAROSCOPIC GASTRIC SLEEVE RESECTION N/A 11/26/2022   Procedure: LAPAROSCOPIC SLEEVE GASTRECTOMY;  Surgeon: Berna Bue, MD;  Location: [facility];  Service: General;  Laterality: N/A;   NO PAST SURGERIES     TONSILLECTOMY     UPPER GI ENDOSCOPY N/A 11/26/2022   Procedure: UPPER GI ENDOSCOPY;  Surgeon: Berna Bue, MD;  Location: [facility];  Service: General;  Laterality: N/A;   WISDOM TOOTH EXTRACTION  2023    Family History  Problem Relation Age of Onset   Heart disease Mother    Hypertension Mother    Heart failure Mother    Diabetes Father    Cancer Maternal Grandfather 49       colon cancer   Stroke Neg Hx     Social History   Socioeconomic History   Marital status: Single    Spouse name: Not on file   Number of children: Not on file   Years of education: Not on file   Highest education level: Not on file  Occupational History   Not on file  Tobacco  Use   Smoking status: Former    Current packs/day: 0.00    Average packs/day: 1 pack/day for 6.0 years (6.0 ttl pk-yrs)    Types: Cigarettes    Start date: 07/21/2014    Quit date: 07/21/2020    Years since quitting: 2.7   Smokeless tobacco: Never  Vaping Use   Vaping status: Never Used  Substance and Sexual Activity   Alcohol use: Not Currently    Comment: once every 2 weeks   Drug use: No   Sexual activity: Not Currently  Other Topics Concern   Not on file  Social History Narrative   Lives with mother.  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   08/2022   Social Determinants of Health   Financial Resource Strain: Not on file  Food Insecurity: Patient Declined (11/26/2022)   Hunger Vital Sign    Worried About Running Out of Food in the Last Year: Patient declined    Ran Out of Food in the Last Year: Patient declined  Transportation Needs: No Transportation Needs (11/26/2022)   PRAPARE - Administrator, Civil Service (Medical): No    Lack of Transportation (Non-Medical): No  Physical Activity: Not on file  Stress: Not on file  Social Connections: Not on file  Intimate Partner Violence: Not At Risk (11/26/2022)   Humiliation, Afraid, Rape, and Kick questionnaire    Fear of Current or Ex-Partner: No    Emotionally Abused: No    Physically Abused: No    Sexually Abused: No     Physical Exam   Vitals:   04/16/23 0351  BP: (!) 127/91  Pulse: 82  Resp: 16  Temp: 98.5 F (36.9 C)  SpO2: 99%    CONSTITUTIONAL: Well-appearing, NAD NEURO/PSYCH:  Alert and oriented x 3, no focal deficits EYES:  eyes equal and reactive ENT/NECK:  no LAD, no JVD CARDIO: Regular rate, well-perfused, normal S1 and S2 PULM:  CTAB no wheezing or rhonchi GI/GU:  non-distended, non-tender MSK/SPINE:  No gross deformities, no edema SKIN:  no rash, atraumatic   *Additional and/or pertinent findings included in MDM below  Diagnostic and Interventional Summary    EKG  Interpretation Date/Time:    Ventricular Rate:    PR Interval:    QRS Duration:    QT Interval:    QTC Calculation:   R Axis:      Text Interpretation:         Labs Reviewed  CBG MONITORING, ED - Abnormal; Notable for the following components:      Result Value   Glucose-Capillary 103 (*)    All other components within normal limits  POC URINE PREG, ED    No orders to display    Medications - No data to display   Procedures  /  Critical Care Procedures  ED Course and Medical Decision Making  Initial Impression and Ddx Patient is having asymptomatic hypoglycemia reported from her new device.  Here in the emergency department her blood sugar is 103 however her device is reading in the 70s.  Suspect a component of an accuracy.  Was also inaccurate earlier in the evening when she was at [facility].  Given this and minimal symptoms, doubt emergent process.  She is not taking any metformin or insulin.  No fever, normal vitals.  Past medical/surgical history that increases complexity of ED encounter: None  Interpretation of Diagnostics Laboratory and/or imaging options to aid in the diagnosis/care of the patient were considered.  After careful history and physical examination, it was determined that there was no indication for diagnostics at this time.  Patient Reassessment and Ultimate Disposition/Management     Repeat blood sugar here in the emergency department is again reassuring, appropriate for discharge.  Patient management required discussion with the following services or consulting groups:  None  Complexity of Problems Addressed Acute complicated illness or Injury  Additional Data Reviewed and Analyzed Further history obtained from: None  Additional Factors Impacting ED Encounter Risk None  Elmer Sow. Pilar Plate, MD [facility] Emergency Medicine [facility] [email or web address]  Final Clinical Impressions(s) / ED Diagnoses     ICD-10-CM   1.  Low blood sugar reading  E16.2       ED Discharge Orders     None        Discharge Instructions Discussed with and Provided to Patient:   Discharge Instructions   None      Sabas Sous, MD 04/16/23 [phone]

## 2023-04-16 NOTE — ED Triage Notes (Signed)
Gastric sleeve procedure in July 2024. Says the last couple weeks she has had recurrent episodes of hypoglycemia.   Has a glucose monitor that has been alarming every night while going to bed.    Ate some oatmeal just prior to arrival.

## 2023-04-16 NOTE — ED Triage Notes (Addendum)
Pt states she had a gastric sleeve procedure in July 2024 and was told she was pre diabetic and was put on Metformin. Pt was told to take it "as needed", has not taken it since October. Reports she has been having multiple hypoglycemic episodes.   Was seen at [facility] ED prior to arrival to [facility] but left because she told them her sugar was 3 and they would not give her anything to eat/drink. Pts CBG from [facility] was 116.

## 2023-06-13 ENCOUNTER — Telehealth: Payer: Self-pay | Admitting: Medical

## 2023-06-13 ENCOUNTER — Other Ambulatory Visit: Payer: Self-pay | Admitting: Medical

## 2023-06-13 MED ORDER — TIZANIDINE HCL 4 MG PO TABS: 4.0000 mg | ORAL_TABLET | Freq: Every day | ORAL | 0 refills | Status: DC

## 2023-06-13 NOTE — Telephone Encounter (Signed)
Pt requesting a refill on tiZANidine to  [id number] - [place], [place] - [address] AT [facility] OF [address] & [address]

## 2023-07-15 ENCOUNTER — Telehealth: Payer: Self-pay | Admitting: Medical

## 2023-07-15 NOTE — Telephone Encounter (Signed)
Pt called & states she started having pain/soreness around her ribs & is concerned because it kind of feels like it did before when she had a blood clot, no other sx.  She wanted to know if she could be seen today.  Advised per Vincenza Hews, no other appt available.  If she gets worse or any SOB need to go to ER.  Pt agreed.  Also offered appt for in the morning, she will call back if she wants appt.

## 2023-12-05 ENCOUNTER — Ambulatory Visit: Payer: Self-pay

## 2023-12-05 NOTE — Telephone Encounter (Signed)
 FYI Only or Action Required?: Action required by provider: medication refill request.  Patient was last seen in primary care on 04/09/2023 by Randol Dawes, MD.  Called Nurse Triage reporting Back Pain.  Symptoms began several weeks ago.  Interventions attempted: OTC medications: Naproxen  and Tylenol , Prescription medications: Zanaflex , and Ice/heat application.  Symptoms are: stable.  Triage Disposition: See PCP Within 2 Weeks  Patient/caregiver understands and will follow disposition?: Yes    Copied from CRM [id number]. Topic: Clinical - Red Word Triage >> Dec 05, 2023  4:06 PM Fonda T wrote: Red Word that prompted transfer to Nurse Triage: Patient calling states she is having worsening back pain. Reason for Disposition  Back pain is a chronic symptom (recurrent or ongoing AND present > 4 weeks)  Answer Assessment - Initial Assessment Questions 1. ONSET: When did the pain begin? (e.g., minutes, hours, days)     Worsening over the past month  2. LOCATION: Where does it hurt? (upper, mid or lower back)     Middle of Lower back  3. SEVERITY: How bad is the pain?  (e.g., Scale 1-10; mild, moderate, or severe)     Mild to Moderate  4. PATTERN: Is the pain constant? (e.g., yes, no; constant, intermittent)      Constant, but fluctuating pain  5. RADIATION: Does the pain shoot into your legs or somewhere else?     No  6. CAUSE:  What do you think is causing the back pain?      Chronic back pain since 2017, pulled muscle and pinched nerve. Has excess skin round waste so con  7. BACK OVERUSE:  Any recent lifting of heavy objects, strenuous work or exercise?     No  8. MEDICINES: What have you taken so far for the pain? (e.g., nothing, acetaminophen , NSAIDS)     Naproxen  and Muscle Relaxers  9. NEUROLOGIC SYMPTOMS: Do you have any weakness, numbness, or problems with bowel/bladder control?     No  10. OTHER SYMPTOMS: Do you have any other symptoms? (e.g.,  fever, abdomen pain, burning with urination, blood in urine)       Constipation  11. PREGNANCY: Is there any chance you are pregnant? When was your last menstrual period?       LMP- This week  Protocols used: Back Pain-A-AH

## 2023-12-08 MED ORDER — TIZANIDINE HCL 4 MG PO TABS: 4.0000 mg | ORAL_TABLET | Freq: Every day | ORAL | 0 refills | Status: DC

## 2023-12-09 ENCOUNTER — Other Ambulatory Visit: Payer: Self-pay | Admitting: Internal Medicine

## 2023-12-09 MED ORDER — TIZANIDINE HCL 4 MG PO TABS: 4.0000 mg | ORAL_TABLET | Freq: Every day | ORAL | 0 refills | Status: AC

## 2023-12-11 ENCOUNTER — Telehealth (INDEPENDENT_AMBULATORY_CARE_PROVIDER_SITE_OTHER): Admitting: Medical

## 2023-12-11 VITALS — Temp 98.9°F | Wt 341.0 lb

## 2023-12-11 DIAGNOSIS — R059 Cough, unspecified: Secondary | ICD-10-CM

## 2023-12-11 DIAGNOSIS — R062 Wheezing: Secondary | ICD-10-CM | POA: Diagnosis not present

## 2023-12-11 DIAGNOSIS — J329 Chronic sinusitis, unspecified: Secondary | ICD-10-CM | POA: Diagnosis not present

## 2023-12-11 MED ORDER — ALBUTEROL SULFATE HFA 108 (90 BASE) MCG/ACT IN AERS
2.0000 | INHALATION_SPRAY | Freq: Four times a day (QID) | RESPIRATORY_TRACT | 0 refills | Status: DC | PRN
Start: 2023-12-11 — End: 2024-04-01

## 2023-12-11 MED ORDER — HYDROCODONE BIT-HOMATROP MBR 5-1.5 MG/5ML PO SOLN
5.0000 mL | Freq: Three times a day (TID) | ORAL | 0 refills | Status: AC | PRN
Start: 2023-12-11 — End: 2023-12-16

## 2023-12-11 MED ORDER — CLARITHROMYCIN 500 MG PO TABS: 500.0000 mg | ORAL_TABLET | Freq: Two times a day (BID) | ORAL | 0 refills | Status: DC

## 2023-12-11 NOTE — Progress Notes (Signed)
 Subjective:     Patient ID: Tasha Hughes, female   DOB: [birth year], 33 y.o.   MRN: [id number]  This visit type was conducted due to national recommendations for restrictions regarding the COVID-19 Pandemic (e.g. social distancing) in an effort to limit this patient's exposure and mitigate transmission in our community.  Due to their co-morbid illnesses, this patient is at least at moderate risk for complications without adequate follow up.  This format is felt to be most appropriate for this patient at this time.    Documentation for virtual audio and video telecommunications through [facility] encounter:  The patient was located at home. The provider was located in the office. The patient did consent to this visit and is aware of possible charges through their insurance for this visit.  The other persons participating in this telemedicine service were none. Time spent on call was 20 minutes and in review of previous records 20 minutes total.  This virtual service is not related to other E/M service within previous 7 days.   HPI Chief Complaint  Patient presents with   Acute Visit    Sinus infection since July 4th, symptoms- sneezing, stopped up unable to breath out of nose, headaches, pressure in face.    Virtual today for sinus concerns.  She notes symptoms since July 4.   Symptoms include sinus pressure, sore throat, some cough.  Sometimes wheezing.   No ear pain, no NVD.  Had a fever 2 days ago slight.   No body aches or chills.   +headache and sinus pressure.   Has lots of mucous /phlegm.   No sick contacts.    Using tylenol  sinus severe and hot tea.    No other aggravating or relieving factors. No other complaint.  Past Medical History:  Diagnosis Date   Allergy    Anemia    Chronic back pain    Former smoker    GERD (gastroesophageal reflux disease)    Leukocytosis    Obese    Polycystic ovarian disease    Pre-diabetes    Pulmonary emboli (HCC) 2021   Current  Outpatient Medications on File Prior to Visit  Medication Sig Dispense Refill   COLLAGEN-VITAMIN C-BIOTIN PO Take 1 drop by mouth in the morning. 1 dropperful     ferrous sulfate 325 (65 FE) MG tablet Take 325 mg by mouth in the morning.     Multiple Vitamin (MULTIVITAMIN ADULT PO) Take 1 tablet by mouth daily.     pantoprazole  (PROTONIX ) 40 MG tablet Take 1 tablet (40 mg total) by mouth daily. Take this medication daily regardless of reflux symptoms 90 tablet 0   Phenylephrine-APAP-guaiFENesin (TYLENOL  SINUS SEVERE PO) Take 1 tablet by mouth.     Probiotic Product (PROBIOTIC PO) Take 1 capsule by mouth in the morning and at bedtime. [employer] Wellness Good Girl Vaginal Probiotics     tiZANidine  (ZANAFLEX ) 4 MG tablet Take 1 tablet (4 mg total) by mouth at bedtime. 45 tablet 0   EPINEPHrine  0.3 mg/0.3 mL IJ SOAJ injection Inject 0.3 mg into the muscle as needed for anaphylaxis. (Patient not taking: Reported on 04/09/2023) 1 each 1   [DISCONTINUED] diphenhydrAMINE  (BENADRYL ) 25 mg capsule Take 1 capsule (25 mg total) by mouth every 6 (six) hours as needed for itching. (Patient not taking: Reported on 04/07/2020) 20 capsule 0   No current facility-administered medications on file prior to visit.   Review of Systems As in subjective    Objective:   Physical Exam Due  to coronavirus pandemic stay at home measures, patient visit was virtual and they were not examined in person.   Temp 98.9 F (37.2 C)   Wt (!) 341 lb (154.7 kg)   BMI 56.75 kg/m   Gen: wd, wn, nad Somewhat congested sounding,  no labored breathing or wheezing     Assessment:     Encounter Diagnoses  Name Primary?   Sinusitis, unspecified chronicity, unspecified location Yes   Cough, unspecified type    Wheezing        Plan:     Advise good water  intake, add nasal saline and salt water  gargles, begin antibiotic below, begin inhaler as needed.  She is using inhaler in the past.  Can use Hycodan for worse cough.  Caution  with sedation.  Can use Tylenol  in the counter for pain.  If not much improved within the next 4 to 5 days then recheck or let us  know  Jurni was seen today for acute visit.  Diagnoses and all orders for this visit:  Sinusitis, unspecified chronicity, unspecified location  Cough, unspecified type  Wheezing  Other orders -     clarithromycin  (BIAXIN ) 500 MG tablet; Take 1 tablet (500 mg total) by mouth 2 (two) times daily. -     albuterol  (VENTOLIN  HFA) 108 (90 Base) MCG/ACT inhaler; Inhale 2 puffs into the lungs every 6 (six) hours as needed for wheezing or shortness of breath. -     HYDROcodone  bit-homatropine (HYCODAN) 5-1.5 MG/5ML syrup; Take 5 mLs by mouth every 8 (eight) hours as needed for up to 5 days for cough.  F/u prn

## 2024-04-01 ENCOUNTER — Other Ambulatory Visit: Payer: Self-pay

## 2024-04-01 ENCOUNTER — Emergency Department (HOSPITAL_COMMUNITY)

## 2024-04-01 ENCOUNTER — Encounter (HOSPITAL_COMMUNITY): Payer: Self-pay | Admitting: Emergency Medicine

## 2024-04-01 ENCOUNTER — Ambulatory Visit: Payer: Self-pay

## 2024-04-01 ENCOUNTER — Emergency Department (HOSPITAL_COMMUNITY)
Admission: EM | Admit: 2024-04-01 | Discharge: 2024-04-01 | Disposition: A | Discharge status: Home / Self Care | Attending: Emergency Medicine | Admitting: Emergency Medicine

## 2024-04-01 DIAGNOSIS — R0781 Pleurodynia: Secondary | ICD-10-CM | POA: Diagnosis present

## 2024-04-01 DIAGNOSIS — Z91040 Latex allergy status: Secondary | ICD-10-CM | POA: Insufficient documentation

## 2024-04-01 DIAGNOSIS — M546 Pain in thoracic spine: Secondary | ICD-10-CM | POA: Insufficient documentation

## 2024-04-01 DIAGNOSIS — M549 Dorsalgia, unspecified: Secondary | ICD-10-CM

## 2024-04-01 DIAGNOSIS — Z87891 Personal history of nicotine dependence: Secondary | ICD-10-CM | POA: Diagnosis not present

## 2024-04-01 LAB — COMPREHENSIVE METABOLIC PANEL WITH GFR
ALT: 17 U/L (ref 0–44)
AST: 14 U/L — ABNORMAL LOW (ref 15–41)
Albumin: 4.3 g/dL (ref 3.5–5.0)
Alkaline Phosphatase: 73 U/L (ref 38–126)
Anion gap: 10 (ref 5–15)
BUN: 16 mg/dL (ref 6–20)
CO2: 28 mmol/L (ref 22–32)
Calcium: 9.5 mg/dL (ref 8.9–10.3)
Chloride: 100 mmol/L (ref 98–111)
Creatinine, Ser: 0.61 mg/dL (ref 0.44–1.00)
GFR, Estimated: >60 mL/min (ref >60)
Glucose, Bld: 83 mg/dL (ref 70–99)
Potassium: 3.7 mmol/L (ref 3.5–5.1)
Sodium: 137 mmol/L (ref 135–145)
Total Bilirubin: <0.2 mg/dL (ref 0.0–1.2)
Total Protein: 7.8 g/dL (ref 6.5–8.1)

## 2024-04-01 LAB — TROPONIN T, HIGH SENSITIVITY
Troponin T High Sensitivity: <15 ng/L (ref 0–19)
Troponin T High Sensitivity: <15 ng/L (ref 0–19)

## 2024-04-01 LAB — HCG, SERUM, QUALITATIVE: Preg, Serum: NEGATIVE

## 2024-04-01 LAB — CBC
HCT: 40.8 % (ref 36.0–46.0)
Hemoglobin: 12.7 g/dL (ref 12.0–15.0)
MCH: 25.3 pg — ABNORMAL LOW (ref 26.0–34.0)
MCHC: 31.1 g/dL (ref 30.0–36.0)
MCV: 81.4 fL (ref 80.0–100.0)
Platelets: 380 K/uL (ref 150–400)
RBC: 5.01 MIL/uL (ref 3.87–5.11)
RDW: 15.9 % — ABNORMAL HIGH (ref 11.5–15.5)
WBC: 9 K/uL (ref 4.0–10.5)
nRBC: 0 % (ref 0.0–0.2)

## 2024-04-01 LAB — LIPASE, BLOOD: Lipase: 14 U/L (ref 11–51)

## 2024-04-01 LAB — D-DIMER, QUANTITATIVE: D-Dimer, Quant: 0.68 ug{FEU}/mL — ABNORMAL HIGH (ref 0.00–0.50)

## 2024-04-01 MED ORDER — OXYCODONE HCL 5 MG PO TABS
5.0000 mg | ORAL_TABLET | Freq: Once | ORAL | Status: DC
  Filled 2024-04-01: qty 1

## 2024-04-01 MED ORDER — IOHEXOL 350 MG/ML SOLN
100.0000 mL | Freq: Once | INTRAVENOUS | Status: AC | PRN
  Administered 2024-04-01: 100 mL via INTRAVENOUS

## 2024-04-01 NOTE — ED Provider Notes (Signed)
 [place] EMERGENCY DEPARTMENT AT [facility] Provider Note   CSN: [id number] Arrival date & time: 04/01/24  1615     History  Chief Complaint  Patient presents with   Back Pain   Chest Pain    Tasha Hughes is a 33 y.o. female with PMH as listed below who presents with  left side mid back pain since this morning. States pain worse with movement. Denies shob, cough, fever/chills, flulike symptoms, nausea vomiting diarrhea constipation. States this feeling is similar to when she was dx with a PE. Deep breathing makes pain worse as well. Sx's since this am.  Previous pulmonary embolism was diagnosed in 2022, possible triggers at the time were thought to be either her birth control or the COVID-vaccine.  Patient does not take birth control any longer.  No recent surgeries, hospitalizations, car trips, immobilizations.  Does not take any hormones.  No leg swelling.  Past Medical History:  Diagnosis Date   Allergy    Anemia    Chronic back pain    Former smoker    GERD (gastroesophageal reflux disease)    Leukocytosis    Obese    Polycystic ovarian disease    Pre-diabetes    Pulmonary emboli (HCC) 2021       Home Medications Prior to Admission medications   Medication Sig Start Date End Date Taking? Authorizing Provider  ferrous sulfate 325 (65 FE) MG tablet Take 325 mg by mouth in the morning.   Yes [provider]  hydroquinone 4 % cream Apply 1 Application topically at bedtime as needed (scars). 03/19/24  Yes [provider]  medroxyPROGESTERone (PROVERA) 10 MG tablet Take 10 mg by mouth daily. Once daily for 7 days out of each month.   Yes [provider]  metFORMIN (GLUCOPHAGE) 500 MG tablet Take 500 mg by mouth 2 (two) times daily.   Yes [provider]  Multiple Vitamin (MULTIVITAMIN ADULT PO) Take 1 tablet by mouth daily.   Yes [provider]  Phenylephrine-APAP-guaiFENesin (TYLENOL  SINUS SEVERE PO) Take 1 tablet  by mouth daily.   Yes [provider]  tiZANidine  (ZANAFLEX ) 4 MG tablet Take 1 tablet (4 mg total) by mouth at bedtime. Patient taking differently: Take 4 mg by mouth every 8 (eight) hours as needed for muscle spasms. 12/09/23  Yes Tysinger, Alm RAMAN, PA-C  EPINEPHrine  0.3 mg/0.3 mL IJ SOAJ injection Inject 0.3 mg into the muscle as needed for anaphylaxis. Patient not taking: Reported on 04/09/2023 11/10/21   Horton, Charmaine FALCON, MD  diphenhydrAMINE  (BENADRYL ) 25 mg capsule Take 1 capsule (25 mg total) by mouth every 6 (six) hours as needed for itching. Patient not taking: Reported on 04/07/2020 12/23/18 04/07/20  Nanavati, Ankit, MD      Allergies    Bee venom, Other, Penicillins, Robitussin [guaifenesin], Shrimp [shellfish allergy], Soybean-containing drug products, Sudafed [pseudoephedrine  hcl], Gabapentin, and Latex    Review of Systems   Review of Systems A 10 point review of systems was performed and is negative unless otherwise reported in HPI.  Physical Exam Updated Vital Signs BP 97/66   Pulse 65   Temp 99.2 F (37.3 C) (Oral)   Resp 20   LMP 03/07/2024 (Exact Date)   SpO2 95%  Physical Exam General: Normal appearing obese female, lying in bed.  HEENT: PERRLA, Sclera anicteric, MMM, trachea midline.  Cardiology: RRR, no murmurs/rubs/gallops.  No chest wall tenderness palpation. Resp: Normal respiratory rate and effort. CTAB, no wheezes, rhonchi, crackles.  Abd: Soft, non-tender, non-distended. No rebound tenderness or guarding.  GU: Deferred. MSK: No peripheral edema or signs of trauma. Extremities without deformity or TTP. No cyanosis or clubbing. Skin: warm, dry. Back: No CVA tenderness.  No evidence of any trauma.  No midline CT or L-spine tenderness to palpation deformities or step-offs.  Mild left thoracic paraspinal muscular trapezius tenderness to palpation. Neuro: A&Ox4, CNs II-XII grossly intact. MAEs. Sensation grossly intact.  Psych: Normal mood and  affect.   ED Results / Procedures / Treatments   Labs (all labs ordered are listed, but only abnormal results are displayed) Labs Reviewed  CBC - Abnormal; Notable for the following components:      Result Value   MCH 25.3 (*)    RDW 15.9 (*)    All other components within normal limits  COMPREHENSIVE METABOLIC PANEL WITH GFR - Abnormal; Notable for the following components:   AST 14 (*)    All other components within normal limits  D-DIMER, QUANTITATIVE - Abnormal; Notable for the following components:   D-Dimer, Quant 0.68 (*)    All other components within normal limits  HCG, SERUM, QUALITATIVE  LIPASE, BLOOD  URINALYSIS, ROUTINE W REFLEX MICROSCOPIC  TROPONIN T, HIGH SENSITIVITY  TROPONIN T, HIGH SENSITIVITY    EKG EKG Interpretation Date/Time:  Thursday April 01 2024 16:42:58 EST Ventricular Rate:  63 PR Interval:  156 QRS Duration:  86 QT Interval:  394 QTC Calculation: 403 R Axis:   45  Text Interpretation: Normal sinus rhythm Cannot rule out Anterior infarct , age undetermined Confirmed by Franklyn Gills [phone]) on 04/01/2024 4:47:24 PM  Radiology CT Angio Chest PE W and/or Wo Contrast Result Date: 04/01/2024 CLINICAL DATA:  Concern for pulmonary edema. EXAM: CT ANGIOGRAPHY CHEST WITH CONTRAST TECHNIQUE: Multidetector CT imaging of the chest was performed using the standard protocol during bolus administration of intravenous contrast. Multiplanar CT image reconstructions and MIPs were obtained to evaluate the vascular anatomy. RADIATION DOSE REDUCTION: This exam was performed according to the departmental dose-optimization program which includes automated exposure control, adjustment of the mA and/or kV according to patient size and/or use of iterative reconstruction technique. CONTRAST:  OMNIPAQUE  IOHEXOL  350 MG/ML SOLN COMPARISON:  Chest radiograph dated 04/01/2024 and CT dated 07/07/2022. FINDINGS: Cardiovascular: There is no cardiomegaly or pericardial  effusion. The thoracic aorta is unremarkable. The origins of the great vessels of the aortic arch are patent. No pulmonary artery embolus identified. Mediastinum/Nodes: No hilar or mediastinal adenopathy. The esophagus is grossly unremarkable. No mediastinal fluid collection. Lungs/Pleura: The lungs are clear. There is no pleural effusion pneumothorax. The central airways are patent. Upper Abdomen: No acute abnormality. Musculoskeletal: No acute osseous pathology. Review of the MIP images confirms the above findings. IMPRESSION: No acute intrathoracic pathology. No CT evidence of pulmonary edema. Electronically Signed   By: Vanetta Chou M.D.   On: 04/01/2024 20:13   DG Chest Hughes 1 View Result Date: 04/01/2024 CLINICAL DATA:  Chest heaviness back pain EXAM: PORTABLE CHEST 1 VIEW COMPARISON:  08/30/2022 FINDINGS: Borderline to mild cardiomegaly. No acute airspace disease, pleural effusion, or pneumothorax IMPRESSION: No active disease. Borderline to mild cardiomegaly. Electronically Signed   By: Luke Bun M.D.   On: 04/01/2024 17:26    Procedures Procedures    Medications Ordered in ED Medications  oxyCODONE  (Oxy IR/ROXICODONE ) immediate release tablet 5 mg (5 mg Oral Patient Refused/Not Given 04/01/24 2058)  iohexol  (OMNIPAQUE ) 350 MG/ML injection 100 mL (100 mLs Intravenous Contrast Given 04/01/24 1945)  ED Course/ Medical Decision Making/ A&P                          Medical Decision Making Amount and/or Complexity of Data Reviewed Labs: ordered. Decision-making details documented in ED Course. Radiology: ordered. Decision-making details documented in ED Course.  Risk Prescription drug management.    This patient presents to the ED for concern of left-sided chest pain and back pain, this involves an extensive number of treatment options, and is a complaint that carries with it a high risk of complications and morbidity.  I considered the following differential and admission for  this acute, potentially life threatening condition.   MDM:    DDX for chest pain includes but is not limited to:  Greatest concern for pulmonary embolism in a patient with a history of PE, not currently on blood thinners, with pleuritic left-sided chest pain.  D-dimer was elevated, will obtain a CT PE.  Negative troponin, very low concern for ACS or arrhythmia.  EKG without any signs of arrhythmia or ischemia.  Very low concern for aortic dissection in this patient as well.  Also considered GERD/PUD/gastritis, or musculoskeletal pain.  Patient did state that she slept in an odd position last night and it could be musculoskeletal pain.  She also has a very low-grade fever, 99.2 F, and has felt sort of achy, also consider bodyaches or a systemic viral illness though she has no other nasal congestion cough sore throat or other viral symptoms.   Ultimately patient's troponin is negative, chest x-ray negative, and CT PE is negative for any intrathoracic pathology.  EKG is very reassuring.  Patient is very reassured of her negative symptoms and I believe she is likely having musculoskeletal pain or bodyaches.  I recommend alternating Tylenol  ibuprofen  and following up with her PCP if her symptoms do not improve.  Patient is extremely well-appearing, feels well, and is asking to be discharged.  Given discharge instructions and return precautions, all questions answered to patient satisfaction.  Clinical Course as of 04/01/24 2257  Thu Apr 01, 2024  1911 D-Dimer, Quant(!): 0.68 Positive D-dimer, getting CT PE [HN]  1911 Troponin T High Sensitivity: <15 neg [HN]  1911 DG Chest Hughes 1 View No active disease. Borderline to mild cardiomegaly. [HN]  2106 CT Angio Chest PE W and/or Wo Contrast No acute intrathoracic pathology. No CT evidence of pulmonary edema. [HN]    Clinical Course User Index [HN] Franklyn Sid SAILOR, MD    Labs: I Ordered, and personally interpreted labs.  The pertinent results include:  Those listed above  Imaging Studies ordered: I ordered imaging studies including chest x-ray, CT PE I independently visualized and interpreted imaging. I agree with the radiologist interpretation  Additional history obtained from chart review.   Reevaluation: After the interventions noted above, I reevaluated the patient and found that they have :improved  Social Determinants of Health:  lives independently  Disposition: DC  Co morbidities that complicate the patient evaluation  Past Medical History:  Diagnosis Date   Allergy    Anemia    Chronic back pain    Former smoker    GERD (gastroesophageal reflux disease)    Leukocytosis    Obese    Polycystic ovarian disease    Pre-diabetes    Pulmonary emboli (HCC) 2021     Medicines Meds ordered this encounter  Medications   oxyCODONE  (Oxy IR/ROXICODONE ) immediate release tablet 5 mg    Refill:  0   iohexol  (OMNIPAQUE ) 350 MG/ML injection 100 mL    I have reviewed the patients home medicines and have made adjustments as needed  Problem List / ED Course: Problem List Items Addressed This Visit   None Visit Diagnoses       Pleuritic chest pain    -  Primary     Upper back pain on left side       Relevant Medications   oxyCODONE  (Oxy IR/ROXICODONE ) immediate release tablet 5 mg                   This note was created using dictation software, which may contain spelling or grammatical errors.    Franklyn Sid SAILOR, MD 04/01/24 2258

## 2024-04-01 NOTE — ED Notes (Signed)
 Pt is Aox4 for this RN.

## 2024-04-01 NOTE — ED Triage Notes (Addendum)
 Pt c/o left side mid back pain since this morning. States pain worse with movement. Denies shob. States this feeling is similar to when she was dx with a PE. Deep breathing makes pain worse as well. Nad. No shob noted or observed. Color wnl. Non diaphoretic 1640-when asked if she has any chest pain pt statedno my chest just feels heavy. Sx's since this am.

## 2024-04-01 NOTE — Telephone Encounter (Signed)
FYI pt was advised to go to ED.

## 2024-04-01 NOTE — Discharge Instructions (Signed)
 Thank you for coming to [facility] Emergency Department. You were seen for chest and back pain. We did an exam, labs, and imaging, and these showed no acute findings. You can alternate taking Tylenol  and ibuprofen  as needed for pain. You can take 650mg  tylenol  (acetaminophen ) every 4-6 hours, and 600 mg ibuprofen  3 times a day. Your pain is likely musculoskeletal in nature. Please follow up with your primary care provider within 1 week as needed or if your symptoms do not improve.   Do not hesitate to return to the ED or call 911 if you experience: -Worsening symptoms -Shortness of breath -Coughing up blood -Pain when you urinate or burning with urination -Abdominal pain -Lightheadedness, passing out -Fevers/chills -Anything else that concerns you

## 2024-04-01 NOTE — Telephone Encounter (Signed)
 FYI Only or Action Required?: FYI only for provider: ED advised.  Patient was last seen in primary care on 12/11/2023 by Bulah Alm RAMAN, PA-C.  Called Nurse Triage reporting Back Pain.  Symptoms began today.  Interventions attempted: Nothing.  Symptoms are: rapidly worsening.  Triage Disposition: Go to ED Now (Notify PCP)  Patient/caregiver understands and will follow disposition?: Yes     Copied from CRM #[id number]. Topic: Clinical - Red Word Triage >> Apr 01, 2024 12:17 PM Tasha Hughes wrote: Pt thinks her blood clots are back and noticed around 4 or 5 this morning and pain in her back near her shoulder like a dull ache. When she takes deep breaths she can feel it. Reason for Disposition  [1] SEVERE back pain (e.g., excruciating) AND [2] sudden onset AND [3] age > 60 years  Answer Assessment - Initial Assessment Questions Pt states she has a hx of blood clots and states symptoms are similar. States she can feel increasing pain when taking a deep breath. Pt states she will to to ED to be seen for symptoms.   1. ONSET: When did the pain begin? (e.g., minutes, hours, days)     Today  2. LOCATION: Where does it hurt? (upper, mid or lower back)     Upper back Shoulder area 3. SEVERITY: How bad is the pain?  (e.g., Scale 1-10; mild, moderate, or severe)     5 or 6/10  4. PATTERN: Is the pain constant? (e.g., yes, no; constant, intermittent)      Comes and goes  6. CAUSE:  What do you think is causing the back pain?      Possible PE  8. MEDICINES: What have you taken so far for the pain? (e.g., nothing, acetaminophen , NSAIDS)     Denies  9. NEUROLOGIC SYMPTOMS: Do you have any weakness, numbness, or problems with bowel/bladder control?     Denies  10. OTHER SYMPTOMS: Do you have any other symptoms? (e.g., fever, abdomen pain, burning with urination, blood in urine)       Denies  Protocols used: Back Pain-A-AH

## 2024-09-24 ENCOUNTER — Encounter: Admitting: Medical
